# Patient Record
Sex: Male | Born: 1935 | Race: Black or African American | Hispanic: No | Marital: Married
Health system: Midwestern US, Community
[De-identification: ages and names within clinical notes are randomized; demographics above are authoritative.]

## PROBLEM LIST (undated history)

## (undated) DIAGNOSIS — R413 Other amnesia: Secondary | ICD-10-CM

## (undated) DIAGNOSIS — R972 Elevated prostate specific antigen [PSA]: Secondary | ICD-10-CM

## (undated) DIAGNOSIS — Z23 Encounter for immunization: Secondary | ICD-10-CM

## (undated) DIAGNOSIS — N401 Enlarged prostate with lower urinary tract symptoms: Secondary | ICD-10-CM

## (undated) DIAGNOSIS — I10 Essential (primary) hypertension: Secondary | ICD-10-CM

## (undated) DIAGNOSIS — E782 Mixed hyperlipidemia: Secondary | ICD-10-CM

## (undated) DIAGNOSIS — R569 Unspecified convulsions: Secondary | ICD-10-CM

## (undated) DIAGNOSIS — R2689 Other abnormalities of gait and mobility: Secondary | ICD-10-CM

## (undated) DIAGNOSIS — N189 Chronic kidney disease, unspecified: Secondary | ICD-10-CM

## (undated) DIAGNOSIS — E785 Hyperlipidemia, unspecified: Secondary | ICD-10-CM

## (undated) DIAGNOSIS — N400 Enlarged prostate without lower urinary tract symptoms: Secondary | ICD-10-CM

## (undated) DIAGNOSIS — D696 Thrombocytopenia, unspecified: Secondary | ICD-10-CM

## (undated) HISTORY — DX: Other amnesia: R41.3

## (undated) HISTORY — PX: REPLACEMENT TOTAL KNEE: SUR1224

## (undated) HISTORY — PX: CATARACT EXTRACTION: SUR2

## (undated) HISTORY — DX: Thrombocytopenia, unspecified: D69.6

## (undated) HISTORY — PX: INGUINAL HERNIA REPAIR: SUR1180

## (undated) HISTORY — DX: Benign prostatic hyperplasia without lower urinary tract symptoms: N40.0

## (undated) HISTORY — DX: Chronic kidney disease, unspecified: N18.9

## (undated) HISTORY — DX: Essential (primary) hypertension: I10

## (undated) HISTORY — DX: Hyperlipidemia, unspecified: E78.5

## (undated) HISTORY — PX: ROTATOR CUFF REPAIR: SHX139

---

## 2008-04-21 ENCOUNTER — Inpatient Hospital Stay (HOSPITAL_COMMUNITY): Admission: RE | Admit: 2008-04-21 | Discharge: 2008-04-23 | Payer: Self-pay | Admitting: Orthopedic Surgery

## 2009-07-05 IMAGING — CR DG CHEST 2V
2 series · 2 of 2 positions shown · non-contrast
Comparison: None

CLINICAL DATA: Hypertension.  Ex-smoker.  Preoperative evaluation.

CHEST - 2 VIEW

[view not recorded (1 of 2)]
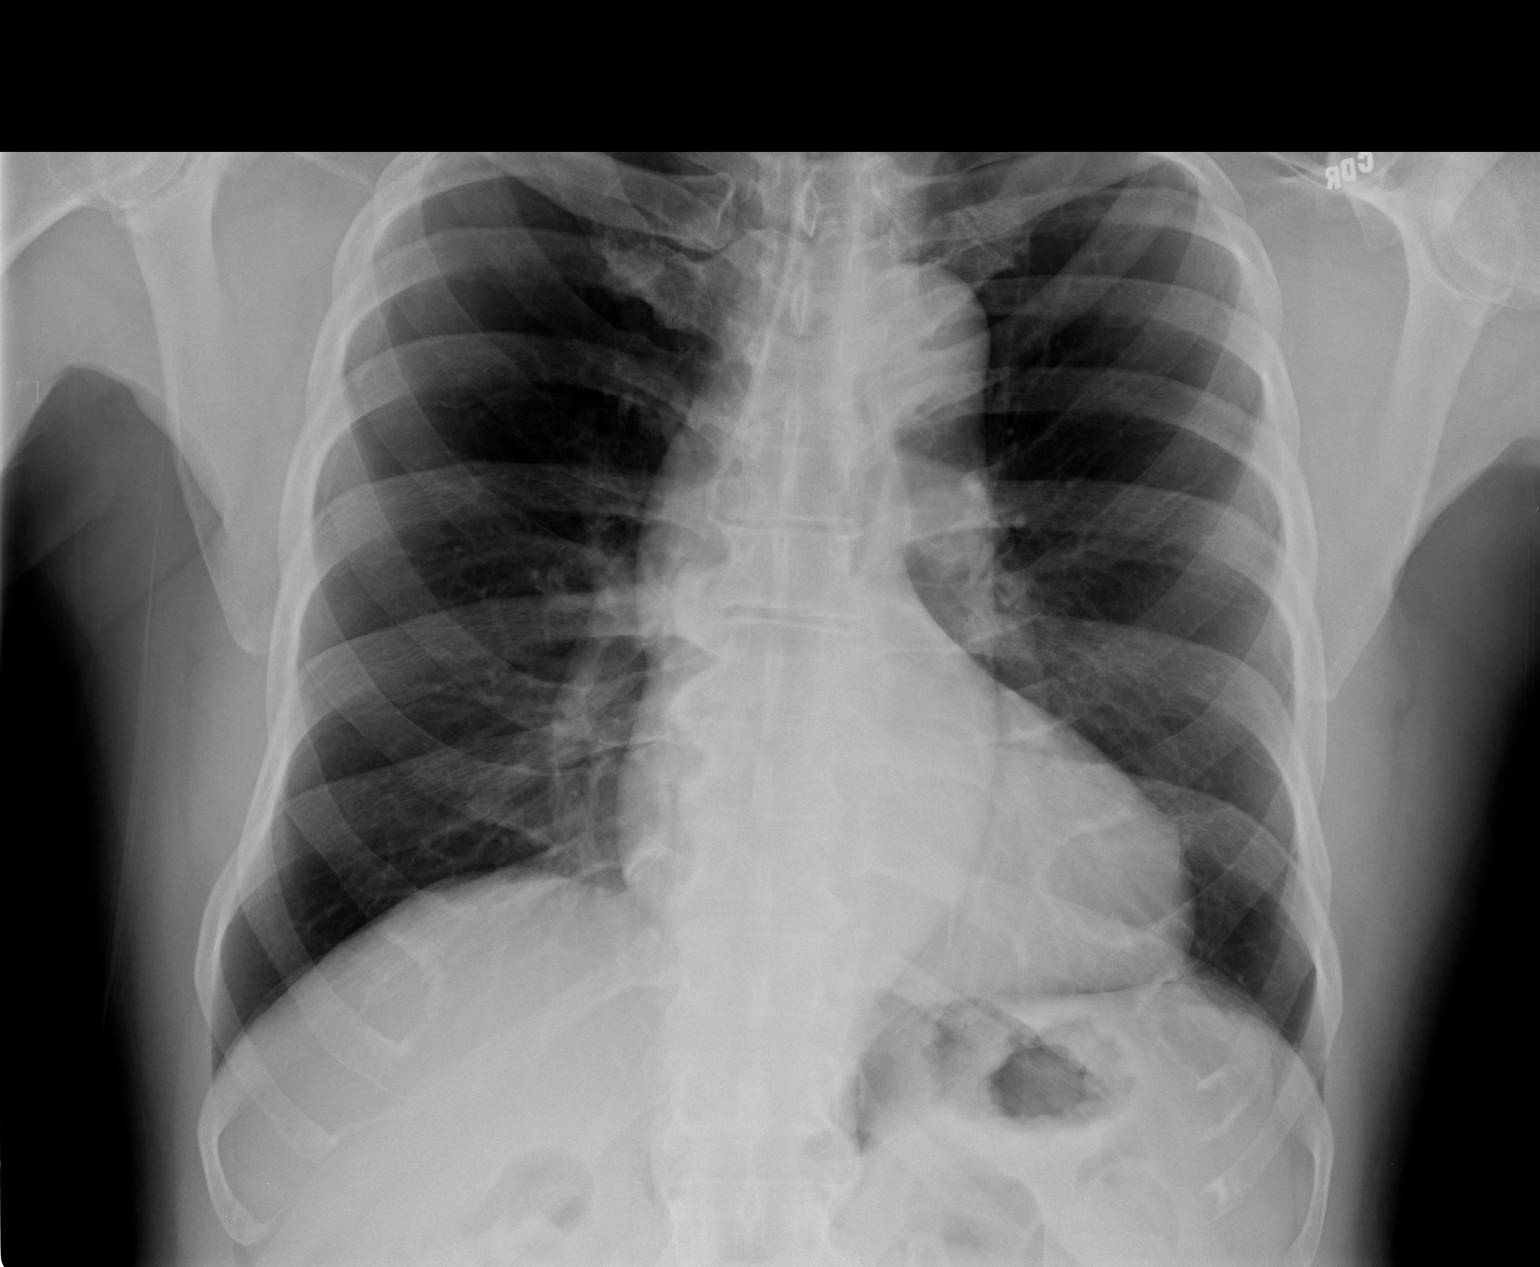

[view not recorded (2 of 2)]
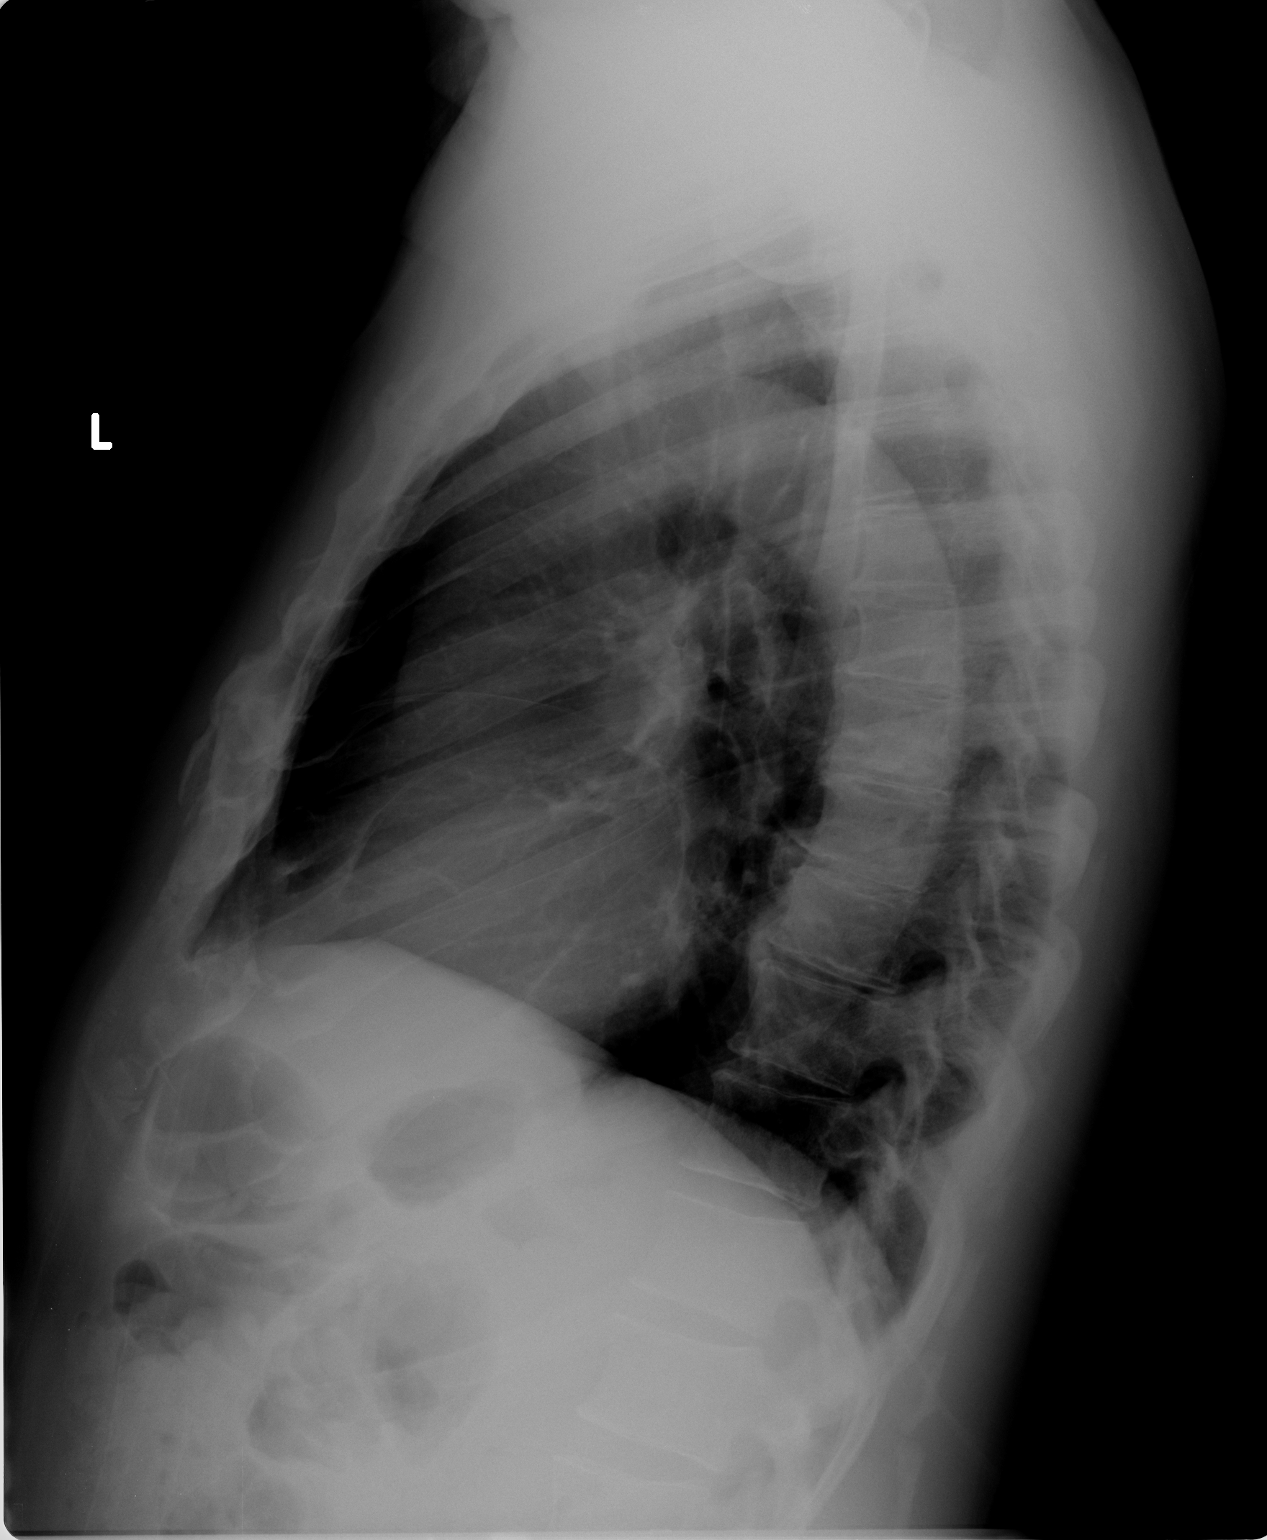

[2 of 2 positions shown; findings below may reference images not displayed]

FINDINGS: Cardiac silhouette is borderline in size.  Ectasia and
tortuosity of thoracic aorta is seen.  No pleural disease is
evident.  Lungs are free of infiltrates.  There are osteophytes in
the spine.
IMPRESSION: Borderline cardiac size with no acute or active process seen.

## 2009-12-14 ENCOUNTER — Encounter: Admission: RE | Admit: 2009-12-14 | Discharge: 2009-12-14 | Payer: Self-pay | Admitting: Orthopedic Surgery

## 2009-12-16 ENCOUNTER — Ambulatory Visit (HOSPITAL_BASED_OUTPATIENT_CLINIC_OR_DEPARTMENT_OTHER): Admission: RE | Admit: 2009-12-16 | Discharge: 2009-12-16 | Payer: Self-pay | Admitting: Orthopedic Surgery

## 2011-02-11 LAB — BASIC METABOLIC PANEL
Creatinine, Ser: 1.2 mg/dL (ref 0.4–1.5)
GFR calc Af Amer: >60 mL/min (ref >60)
GFR calc non Af Amer: 59 mL/min — ABNORMAL LOW (ref >60)
Glucose, Bld: 102 mg/dL — ABNORMAL HIGH (ref 70–99)
Potassium: 4.7 mEq/L (ref 3.5–5.1)
Sodium: 143 mEq/L (ref 135–145)

## 2011-03-05 IMAGING — CR DG CHEST 2V
2 series · 2 of 2 positions shown · non-contrast
Comparison: 04/15/2008

CLINICAL DATA: Preop impingement right shoulder.  Hypertension.

CHEST - 2 VIEW

[w chest pa]
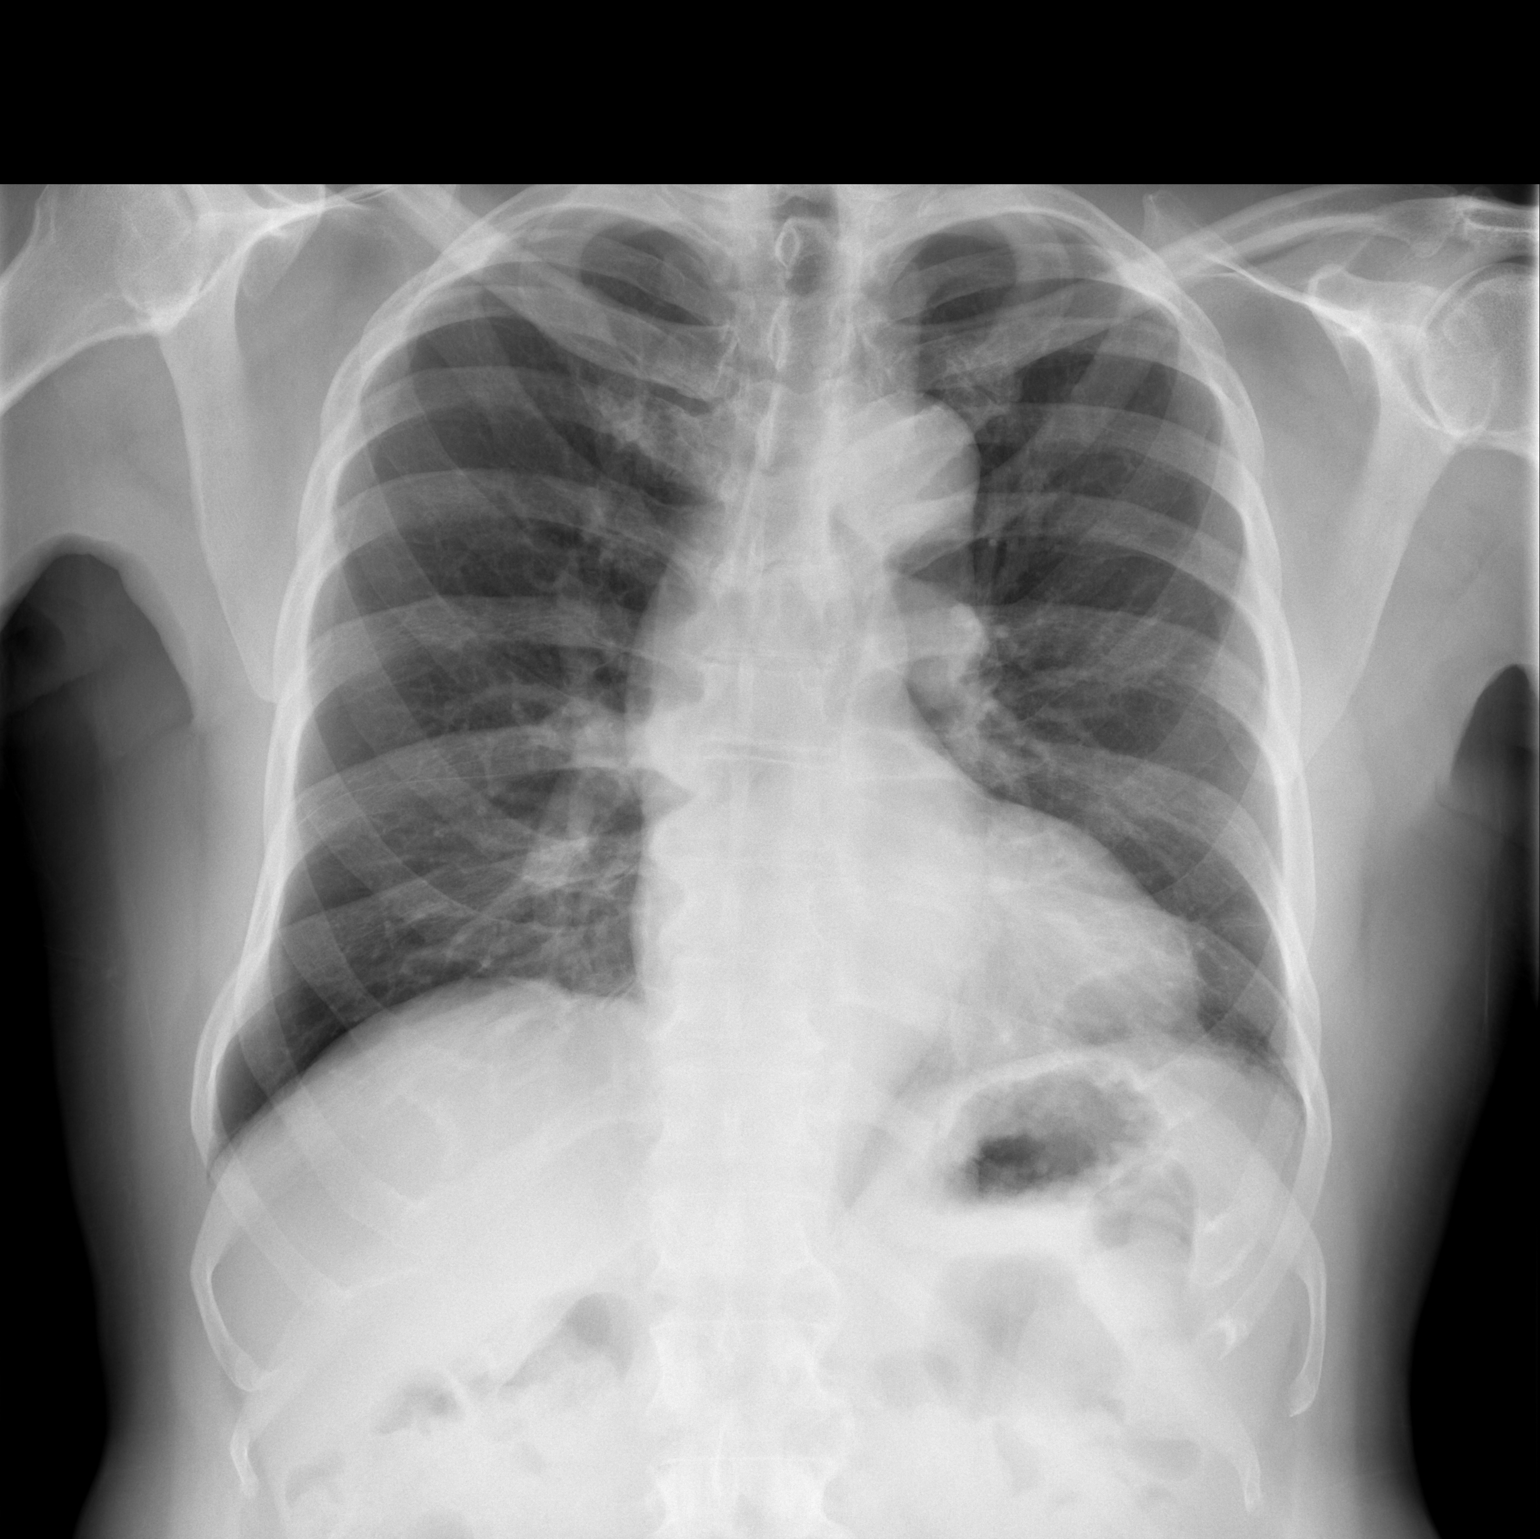

[w chest lat]
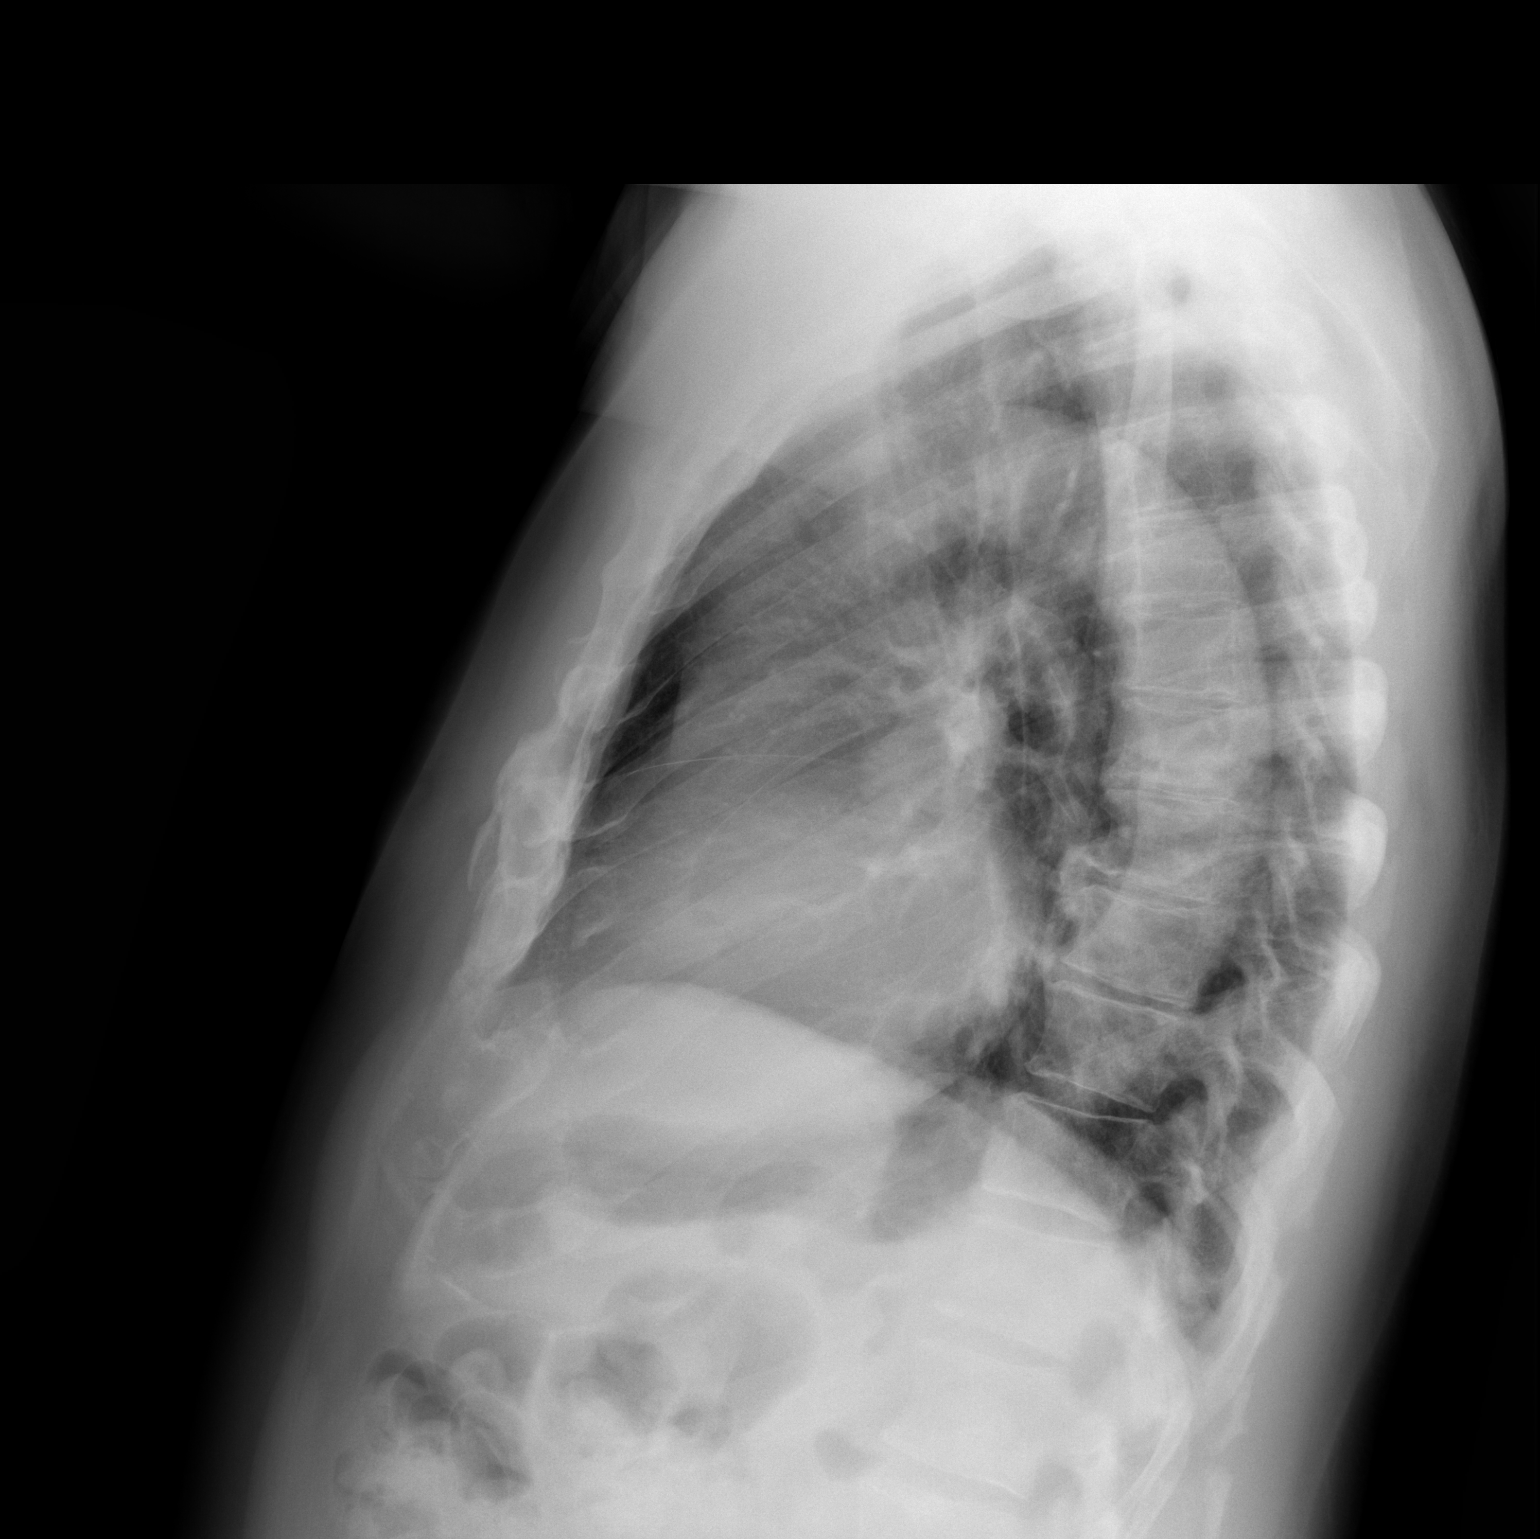

[2 of 2 positions shown; findings below may reference images not displayed]

FINDINGS: Cardiac size within normal limits.  Tortuous ectatic
thoracic aorta.  Lungs clear.  Bony thorax intact.
IMPRESSION: No active chest disease.

## 2011-04-10 NOTE — Op Note (Signed)
Kenneth [name], [name]             ACCOUNT NO.:  [id number]   MEDICAL RECORD NO.:  [id number]          PATIENT TYPE:  INP   LOCATION:  5005                         FACILITY:  [facility]   PHYSICIAN:  Harvie Junior, M.D.   DATE OF BIRTH:  08/29/1933   DATE OF PROCEDURE:  04/21/2008  DATE OF DISCHARGE:                               OPERATIVE REPORT   PREOPERATIVE DIAGNOSIS:  End-stage degenerative joint disease, left  knee.   POSTOPERATIVE DIAGNOSIS:  End-stage degenerative joint disease, left  knee.   OPERATIONS AND PROCEDURES:  1. Left total knee replacement with Sigma system size 5 femur, size 5      tibia, 10-mm bridging bearing, and a 38-mm All-Polyethylene      patella.  2. Operative procedure with computer-assisted total knee replacement,      left.   SURGEON:  Harvie Junior, MD   ASSISTANT:  Marshia Ly, PA   ANESTHESIA:  General.   BRIEF HISTORY:  Kenneth [name] is a 77 year old male with long history of  having had significant degenerative joint disease of his left knee.  He  had been treated conservatively for a period of time with failure of  activity modification, anti-inflammatory medication, and use of  assistive devices.  Having failed all of this, the patient was  ultimately taken to the operating room for total knee replacement.  The  patient had extremely youthful appearance and the longevity in the  family, we felt that computer assisted was appropriate, given the young  physiologic age of the patient for total knee replacement.   PROCEDURE:  The patient was taken to the operating room.  After adequate  anesthesia was obtained under general anesthetic, the patient placed in  supine on the operative room table.  The left leg was prepped and draped  in the usual sterile fashion.  Following this, the leg was exsanguinated  and blood pressure tourniquet was inflated to 350 mmHg.  Following this,  a midline incision was made.  Subcutaneous tissue was dissected  down to  the level of the extensor mechanism and a medial parapatellar arthrotomy  was undertaken and the medial and lateral meniscuses were then removed  as well as the anterior and posterior cruciates and a medial release was  performed, given the varus malalignment of the knee.  Once this was  completed, attention was turned towards the computer-assistance modules;  2 pins in the tibia and 2 pins in the femur and then registration  process that was about 20 minutes of surgical procedure.  Once this was  done, the tibia was cut perpendicular to its long axis under computer  assistance and the femur was then cut perpendicular to the anatomic axis  and then spacer blocks were used.  Excellent neutral long alignment was  achieved at this point with perfect gap balance and extension.  At this  point, attention was turned towards the sizing and the femur sized to a  5+ and we ultimately downsized to a 5 and used 3 degrees of external  rotation and matches with the computer and then did anterior and  posterior  cuts as well as the chamfers.  The rotational block was then  used at this point.  The flexion gap was checked with computer  assistance prior to doing the block cut.  At this point, the attention  was turned to the tibia which was then sized and the rotational  alignment was undertaken and a size 5 tibial trial was put in place.  The femoral components, when we went to put it on, the lateral side of  the component was edged into the pins per the computer assistance.  We  knew that we had perfect neutral long alignment of the extension and the  flexion gap was balanced perfectly.  At this point, we pulled the  computer-assistance alignment modules.  Once these were pulled, the  trial femoral component was put in the place, long alignment was  checked.  The patella was then cut with patellar cutting guide to 14 mm  and 38 mm chosen and lugs were drilled here.  Once this was completed,   attention was turned towards removal of all trial components after we  assured that we have perfect neutral long alignment and easy full range  of motion.  All trial components were removed.  The knee was then  copiously and thoroughly irrigated and suctioned dry.  Cement was then  applied in the interfaces of the bone after it was mixed in the back  table.  A size-5 femur was cemented in the place, size 5 tibia was  cemented in the place, and a trial 10-mm bearing was put in place with  the knee held in full extension and then a 38-mm All-Poly patella was  then put in place.  Once this was completed, the cement was allowed to  harden.  The trial bearing was removed and a tourniquet was let down.  All bleeding was controlled with electrocautery.  At this point, a  medium Hemovac drain was placed and the medial parapatellar arthrotomy  was closed with 1 Vicryl running, the skin with 0 and 2-0 Vicryl, and  skin with staples.  Sterile compression dressing was applied.  The  patient was taken to the recovery room and was noted to be in  satisfactory condition.  Estimated blood loss during the procedure was  none.      Harvie Junior, M.D.  Electronically Signed     JLG/MEDQ  D:  04/21/2008  T:  04/22/2008  Job:  [id number]

## 2011-08-22 LAB — CBC
HCT: 34 — ABNORMAL LOW
Hemoglobin: 11.4 — ABNORMAL LOW
Hemoglobin: 13.6
Platelets: 122 — ABNORMAL LOW
Platelets: 132 — ABNORMAL LOW
RBC: 4.54
RDW: 14.9
RDW: 14.9
WBC: 10.8 — ABNORMAL HIGH
WBC: 6.6

## 2011-08-22 LAB — BASIC METABOLIC PANEL
BUN: 14
CO2: 24
Chloride: 107
Glucose, Bld: 142 — ABNORMAL HIGH
Potassium: 4.1
Sodium: 136

## 2011-08-22 LAB — URINALYSIS, ROUTINE W REFLEX MICROSCOPIC
Bilirubin Urine: NEGATIVE
Glucose, UA: NEGATIVE
Protein, ur: NEGATIVE
Urobilinogen, UA: 0.2

## 2011-08-22 LAB — DIFFERENTIAL
Basophils Absolute: 0
Basophils Relative: 1
Eosinophils Absolute: 0.1
Lymphocytes Relative: 33
Monocytes Relative: 7
Neutrophils Relative %: 58

## 2011-08-22 LAB — APTT: aPTT: 29

## 2011-08-22 LAB — COMPREHENSIVE METABOLIC PANEL
Albumin: 4.1
Chloride: 106
Potassium: 3.7
Total Bilirubin: 0.8
Total Protein: 7.9

## 2011-08-22 LAB — PROTIME-INR
INR: 0.9
INR: 1.2
Prothrombin Time: 12.4
Prothrombin Time: 13.9

## 2011-08-22 LAB — TYPE AND SCREEN
ABO/RH(D): O POS
Antibody Screen: NEGATIVE

## 2011-08-22 LAB — ABO/RH: ABO/RH(D): O POS

## 2011-12-27 DIAGNOSIS — M25519 Pain in unspecified shoulder: Secondary | ICD-10-CM | POA: Diagnosis not present

## 2012-03-04 DIAGNOSIS — I10 Essential (primary) hypertension: Secondary | ICD-10-CM | POA: Diagnosis not present

## 2012-03-04 DIAGNOSIS — R413 Other amnesia: Secondary | ICD-10-CM | POA: Diagnosis not present

## 2012-03-04 DIAGNOSIS — N400 Enlarged prostate without lower urinary tract symptoms: Secondary | ICD-10-CM | POA: Diagnosis not present

## 2012-03-04 DIAGNOSIS — M719 Bursopathy, unspecified: Secondary | ICD-10-CM | POA: Diagnosis not present

## 2012-03-04 DIAGNOSIS — E782 Mixed hyperlipidemia: Secondary | ICD-10-CM | POA: Diagnosis not present

## 2012-04-09 DIAGNOSIS — R972 Elevated prostate specific antigen [PSA]: Secondary | ICD-10-CM | POA: Diagnosis not present

## 2012-04-09 DIAGNOSIS — N401 Enlarged prostate with lower urinary tract symptoms: Secondary | ICD-10-CM | POA: Diagnosis not present

## 2012-09-15 DIAGNOSIS — I10 Essential (primary) hypertension: Secondary | ICD-10-CM | POA: Diagnosis not present

## 2012-09-15 DIAGNOSIS — N400 Enlarged prostate without lower urinary tract symptoms: Secondary | ICD-10-CM | POA: Diagnosis not present

## 2012-09-15 DIAGNOSIS — E782 Mixed hyperlipidemia: Secondary | ICD-10-CM | POA: Diagnosis not present

## 2012-09-15 DIAGNOSIS — Z23 Encounter for immunization: Secondary | ICD-10-CM | POA: Diagnosis not present

## 2012-09-15 DIAGNOSIS — R413 Other amnesia: Secondary | ICD-10-CM | POA: Diagnosis not present

## 2012-09-15 DIAGNOSIS — H269 Unspecified cataract: Secondary | ICD-10-CM | POA: Diagnosis not present

## 2012-10-08 DIAGNOSIS — N401 Enlarged prostate with lower urinary tract symptoms: Secondary | ICD-10-CM | POA: Diagnosis not present

## 2012-10-08 DIAGNOSIS — R972 Elevated prostate specific antigen [PSA]: Secondary | ICD-10-CM | POA: Diagnosis not present

## 2013-03-17 DIAGNOSIS — R413 Other amnesia: Secondary | ICD-10-CM | POA: Diagnosis not present

## 2013-03-17 DIAGNOSIS — I10 Essential (primary) hypertension: Secondary | ICD-10-CM | POA: Diagnosis not present

## 2013-03-17 DIAGNOSIS — N400 Enlarged prostate without lower urinary tract symptoms: Secondary | ICD-10-CM | POA: Diagnosis not present

## 2013-03-17 DIAGNOSIS — E782 Mixed hyperlipidemia: Secondary | ICD-10-CM | POA: Diagnosis not present

## 2013-04-08 DIAGNOSIS — N401 Enlarged prostate with lower urinary tract symptoms: Secondary | ICD-10-CM | POA: Diagnosis not present

## 2013-04-15 DIAGNOSIS — R972 Elevated prostate specific antigen [PSA]: Secondary | ICD-10-CM | POA: Diagnosis not present

## 2013-04-15 DIAGNOSIS — N401 Enlarged prostate with lower urinary tract symptoms: Secondary | ICD-10-CM | POA: Diagnosis not present

## 2013-04-28 DIAGNOSIS — M79609 Pain in unspecified limb: Secondary | ICD-10-CM | POA: Diagnosis not present

## 2013-04-28 DIAGNOSIS — B351 Tinea unguium: Secondary | ICD-10-CM | POA: Diagnosis not present

## 2013-05-26 DIAGNOSIS — B351 Tinea unguium: Secondary | ICD-10-CM | POA: Diagnosis not present

## 2013-05-26 DIAGNOSIS — M79609 Pain in unspecified limb: Secondary | ICD-10-CM | POA: Diagnosis not present

## 2013-06-23 DIAGNOSIS — B351 Tinea unguium: Secondary | ICD-10-CM | POA: Diagnosis not present

## 2013-06-23 DIAGNOSIS — M79609 Pain in unspecified limb: Secondary | ICD-10-CM | POA: Diagnosis not present

## 2013-06-23 DIAGNOSIS — M659 Synovitis and tenosynovitis, unspecified: Secondary | ICD-10-CM | POA: Diagnosis not present

## 2013-06-23 DIAGNOSIS — M25579 Pain in unspecified ankle and joints of unspecified foot: Secondary | ICD-10-CM | POA: Diagnosis not present

## 2013-07-28 DIAGNOSIS — M79609 Pain in unspecified limb: Secondary | ICD-10-CM | POA: Diagnosis not present

## 2013-07-28 DIAGNOSIS — B351 Tinea unguium: Secondary | ICD-10-CM | POA: Diagnosis not present

## 2013-09-18 DIAGNOSIS — R413 Other amnesia: Secondary | ICD-10-CM | POA: Diagnosis not present

## 2013-09-18 DIAGNOSIS — I10 Essential (primary) hypertension: Secondary | ICD-10-CM | POA: Diagnosis not present

## 2013-09-18 DIAGNOSIS — Z0000 Encounter for general adult medical examination without abnormal findings: Secondary | ICD-10-CM | POA: Diagnosis not present

## 2013-09-18 DIAGNOSIS — E782 Mixed hyperlipidemia: Secondary | ICD-10-CM | POA: Diagnosis not present

## 2013-09-18 DIAGNOSIS — K5900 Constipation, unspecified: Secondary | ICD-10-CM | POA: Diagnosis not present

## 2013-09-18 DIAGNOSIS — H9190 Unspecified hearing loss, unspecified ear: Secondary | ICD-10-CM | POA: Diagnosis not present

## 2013-09-18 DIAGNOSIS — Z23 Encounter for immunization: Secondary | ICD-10-CM | POA: Diagnosis not present

## 2013-09-18 DIAGNOSIS — N400 Enlarged prostate without lower urinary tract symptoms: Secondary | ICD-10-CM | POA: Diagnosis not present

## 2013-11-03 DIAGNOSIS — M79609 Pain in unspecified limb: Secondary | ICD-10-CM | POA: Diagnosis not present

## 2013-11-03 DIAGNOSIS — B351 Tinea unguium: Secondary | ICD-10-CM | POA: Diagnosis not present

## 2013-12-17 DIAGNOSIS — R195 Other fecal abnormalities: Secondary | ICD-10-CM | POA: Diagnosis not present

## 2013-12-17 DIAGNOSIS — D126 Benign neoplasm of colon, unspecified: Secondary | ICD-10-CM | POA: Diagnosis not present

## 2013-12-17 DIAGNOSIS — K620 Anal polyp: Secondary | ICD-10-CM | POA: Diagnosis not present

## 2013-12-17 DIAGNOSIS — K621 Rectal polyp: Secondary | ICD-10-CM | POA: Diagnosis not present

## 2013-12-17 DIAGNOSIS — K648 Other hemorrhoids: Secondary | ICD-10-CM | POA: Diagnosis not present

## 2014-03-16 DIAGNOSIS — Z23 Encounter for immunization: Secondary | ICD-10-CM | POA: Diagnosis not present

## 2014-03-20 DIAGNOSIS — M542 Cervicalgia: Secondary | ICD-10-CM | POA: Diagnosis not present

## 2014-03-20 DIAGNOSIS — M5030 Other cervical disc degeneration, unspecified cervical region: Secondary | ICD-10-CM | POA: Diagnosis not present

## 2014-04-08 DIAGNOSIS — M542 Cervicalgia: Secondary | ICD-10-CM | POA: Diagnosis not present

## 2014-04-08 DIAGNOSIS — M25519 Pain in unspecified shoulder: Secondary | ICD-10-CM | POA: Diagnosis not present

## 2014-04-13 DIAGNOSIS — I10 Essential (primary) hypertension: Secondary | ICD-10-CM | POA: Diagnosis not present

## 2014-04-13 DIAGNOSIS — N183 Chronic kidney disease, stage 3 unspecified: Secondary | ICD-10-CM | POA: Diagnosis not present

## 2014-04-13 DIAGNOSIS — N400 Enlarged prostate without lower urinary tract symptoms: Secondary | ICD-10-CM | POA: Diagnosis not present

## 2014-04-13 DIAGNOSIS — R413 Other amnesia: Secondary | ICD-10-CM | POA: Diagnosis not present

## 2014-04-13 DIAGNOSIS — E782 Mixed hyperlipidemia: Secondary | ICD-10-CM | POA: Diagnosis not present

## 2014-04-14 DIAGNOSIS — R972 Elevated prostate specific antigen [PSA]: Secondary | ICD-10-CM | POA: Diagnosis not present

## 2014-04-21 DIAGNOSIS — R972 Elevated prostate specific antigen [PSA]: Secondary | ICD-10-CM | POA: Diagnosis not present

## 2014-04-21 DIAGNOSIS — N529 Male erectile dysfunction, unspecified: Secondary | ICD-10-CM | POA: Diagnosis not present

## 2014-04-21 DIAGNOSIS — N401 Enlarged prostate with lower urinary tract symptoms: Secondary | ICD-10-CM | POA: Diagnosis not present

## 2014-07-17 DIAGNOSIS — Q762 Congenital spondylolisthesis: Secondary | ICD-10-CM | POA: Diagnosis not present

## 2014-07-21 DIAGNOSIS — IMO0002 Reserved for concepts with insufficient information to code with codable children: Secondary | ICD-10-CM | POA: Diagnosis not present

## 2014-07-26 DIAGNOSIS — IMO0002 Reserved for concepts with insufficient information to code with codable children: Secondary | ICD-10-CM | POA: Diagnosis not present

## 2014-07-28 DIAGNOSIS — IMO0002 Reserved for concepts with insufficient information to code with codable children: Secondary | ICD-10-CM | POA: Diagnosis not present

## 2014-08-03 DIAGNOSIS — IMO0002 Reserved for concepts with insufficient information to code with codable children: Secondary | ICD-10-CM | POA: Diagnosis not present

## 2014-08-05 DIAGNOSIS — IMO0002 Reserved for concepts with insufficient information to code with codable children: Secondary | ICD-10-CM | POA: Diagnosis not present

## 2014-08-09 DIAGNOSIS — IMO0002 Reserved for concepts with insufficient information to code with codable children: Secondary | ICD-10-CM | POA: Diagnosis not present

## 2014-08-10 DIAGNOSIS — IMO0002 Reserved for concepts with insufficient information to code with codable children: Secondary | ICD-10-CM | POA: Diagnosis not present

## 2014-09-14 DIAGNOSIS — Z23 Encounter for immunization: Secondary | ICD-10-CM | POA: Diagnosis not present

## 2014-12-14 DIAGNOSIS — Z1389 Encounter for screening for other disorder: Secondary | ICD-10-CM | POA: Diagnosis not present

## 2014-12-14 DIAGNOSIS — Z0000 Encounter for general adult medical examination without abnormal findings: Secondary | ICD-10-CM | POA: Diagnosis not present

## 2014-12-14 DIAGNOSIS — E782 Mixed hyperlipidemia: Secondary | ICD-10-CM | POA: Diagnosis not present

## 2014-12-14 DIAGNOSIS — R413 Other amnesia: Secondary | ICD-10-CM | POA: Diagnosis not present

## 2014-12-14 DIAGNOSIS — N183 Chronic kidney disease, stage 3 (moderate): Secondary | ICD-10-CM | POA: Diagnosis not present

## 2014-12-14 DIAGNOSIS — N400 Enlarged prostate without lower urinary tract symptoms: Secondary | ICD-10-CM | POA: Diagnosis not present

## 2014-12-14 DIAGNOSIS — I10 Essential (primary) hypertension: Secondary | ICD-10-CM | POA: Diagnosis not present

## 2014-12-14 DIAGNOSIS — I499 Cardiac arrhythmia, unspecified: Secondary | ICD-10-CM | POA: Diagnosis not present

## 2015-04-20 DIAGNOSIS — R972 Elevated prostate specific antigen [PSA]: Secondary | ICD-10-CM | POA: Diagnosis not present

## 2015-04-27 DIAGNOSIS — R972 Elevated prostate specific antigen [PSA]: Secondary | ICD-10-CM | POA: Diagnosis not present

## 2015-04-27 DIAGNOSIS — N5201 Erectile dysfunction due to arterial insufficiency: Secondary | ICD-10-CM | POA: Diagnosis not present

## 2015-06-21 DIAGNOSIS — N183 Chronic kidney disease, stage 3 (moderate): Secondary | ICD-10-CM | POA: Diagnosis not present

## 2015-06-21 DIAGNOSIS — R413 Other amnesia: Secondary | ICD-10-CM | POA: Diagnosis not present

## 2015-06-21 DIAGNOSIS — I10 Essential (primary) hypertension: Secondary | ICD-10-CM | POA: Diagnosis not present

## 2015-06-21 DIAGNOSIS — D696 Thrombocytopenia, unspecified: Secondary | ICD-10-CM | POA: Diagnosis not present

## 2015-06-21 DIAGNOSIS — E782 Mixed hyperlipidemia: Secondary | ICD-10-CM | POA: Diagnosis not present

## 2015-06-21 DIAGNOSIS — N400 Enlarged prostate without lower urinary tract symptoms: Secondary | ICD-10-CM | POA: Diagnosis not present

## 2015-08-02 ENCOUNTER — Encounter: Payer: Self-pay | Admitting: Neurology

## 2015-08-02 ENCOUNTER — Ambulatory Visit (INDEPENDENT_AMBULATORY_CARE_PROVIDER_SITE_OTHER): Payer: Medicare Other | Admitting: Neurology

## 2015-08-02 VITALS — BP 149/77 | HR 48 | Ht 74.0 in | Wt 188.0 lb

## 2015-08-02 DIAGNOSIS — R413 Other amnesia: Secondary | ICD-10-CM | POA: Diagnosis not present

## 2015-08-02 DIAGNOSIS — E538 Deficiency of other specified B group vitamins: Secondary | ICD-10-CM | POA: Diagnosis not present

## 2015-08-02 HISTORY — DX: Other amnesia: R41.3

## 2015-08-02 NOTE — Patient Instructions (Signed)
   We will check blood work today and get MRI evaluation of the brain. We may consider medications for memory in the future if you wish to go on something.

## 2015-08-02 NOTE — Progress Notes (Signed)
Reason for visit:  Memory disorder  Referring physician:  [name] is a 81 y.o. male  History of present illness:   Kenneth [name] is an 81 year old right-handed black male with a one-year history of some decline in memory and cognitive function. He indicates that he is having increasing problems with short-term memory, difficulty remembering names for people, and he will repeat himself frequently. He uses GPS for driving, but his wife indicates that he does have some troubles with directions. He will occasionally forget to pay a bill. He denies any issues keeping up with medications or appointments. He will misplace things about the house on occasion. He has not had to give up anything secondary to memory. He does report some generalized fatigue, but no excessive daytime drowsiness. The wife indicates that he has snored at night for years. He denies any difficulty with balance or difficulty controlling the bowels or the bladder. He denies any numbness or weakness of the extremities. He indicates that he sleeps fairly well at night. The has had routine blood work done through his primary care physician. He is sent to this office for further evaluation.  Past Medical History  Diagnosis Date  . Hyperlipidemia   . Hypertension   . Chronic kidney disease   . BPH (benign prostatic hyperplasia)   . Memory loss   . Thrombocytopenia   . Memory change 08/02/2015    Past Surgical History  Procedure Laterality Date  . Replacement total knee Left   . Rotator cuff repair Bilateral   . Cataract extraction Bilateral   . Inguinal hernia repair Bilateral     Family History  Problem Relation Age of Onset  . Ovarian cancer Mother   . Stomach cancer Father   . Parkinsonism Sister   . Dementia Neg Hx     Social history:  reports that he quit smoking about 38 years ago. He has never used smokeless tobacco. He reports that he does not drink alcohol or use illicit  drugs.  Medications:  Prior to Admission medications   Medication Sig Start Date End Date Taking? Authorizing Provider  amLODipine-benazepril (LOTREL) 10-40 MG per capsule Take 1 capsule by mouth daily.   Yes Historical Provider, MD  aspirin 81 MG tablet Take 81 mg by mouth daily.   Yes Historical Provider, MD  atorvastatin (LIPITOR) 40 MG tablet Take 40 mg by mouth daily.   Yes Historical Provider, MD  finasteride (PROSCAR) 5 MG tablet Take 5 mg by mouth daily.   Yes Historical Provider, MD  Multiple Vitamins-Minerals (CENTRUM SILVER PO) Take 1 tablet by mouth daily.   Yes Historical Provider, MD  naproxen sodium (ANAPROX) 220 MG tablet Take 220 mg by mouth 2 (two) times daily as needed.   Yes Historical Provider, MD  Omega-3 Fatty Acids (OMEGA 3 PO) Take 1 tablet by mouth daily.   Yes Historical Provider, MD     No Known Allergies  ROS:  Out of a complete 14 system review of symptoms, the patient complains only of the following symptoms, and all other reviewed systems are negative.   Memory loss  Impotence  Blood pressure 149/77, pulse 48, height 6\' 2"  (1.88 m), weight 188 lb (85.276 kg).  Physical Exam  General: The patient is alert and cooperative at the time of the examination.  Eyes: Pupils are equal, round, and reactive to light. Discs are flat bilaterally.  Neck: The neck is supple, no carotid bruits are noted.  Respiratory:  The respiratory examination is clear.  Cardiovascular: The cardiovascular examination reveals a regular rate and rhythm, no obvious murmurs or rubs are noted.  Skin: Extremities are without significant edema.  Neurologic Exam  Mental status: The patient is alert and oriented x 3 at the time of the examination. The  Mini-Mental status examination done today shows a total score of 25/30. The patient is able to name 8 animals in 30 seconds.  Cranial nerves: Facial symmetry is present. There is good sensation of the face to pinprick and soft touch  bilaterally. The strength of the facial muscles and the muscles to head turning and shoulder shrug are normal bilaterally. Speech is well enunciated, no aphasia or dysarthria is noted. Extraocular movements are full. Visual fields are full. The tongue is midline, and the patient has symmetric elevation of the soft palate. No obvious hearing deficits are noted.  Motor: The motor testing reveals 5 over 5 strength of all 4 extremities. Good symmetric motor tone is noted throughout.  Sensory: Sensory testing is intact to pinprick, soft touch, vibration sensation, and position sense on all 4 extremities. No evidence of extinction is noted.  Coordination: Cerebellar testing reveals good finger-nose-finger and heel-to-shin bilaterally.  Gait and station: Gait is normal. Tandem gait is normal. Romberg is negative. No drift is seen.  Reflexes: Deep tendon reflexes are symmetric , but are depressed bilaterally. Toes are downgoing bilaterally.   Assessment/Plan:   1. Memory disturbance    The patient has reported a mild memory disturbance that has come on over the last one year. He will be set up for blood work today, and undergo MRI evaluation of the brain. We have discussed the addition of medication such as Aricept, and the possibility of entering a study trial for dementia. I will discuss this with him once the above testing has been completed. He will otherwise follow-up in 6 months.  Kenneth Alexanders MD 08/02/2015 8:28 PM  [facility] [address] [place], [place] [address]  Phone [phone] Fax [phone]

## 2015-08-04 ENCOUNTER — Telehealth: Payer: Self-pay

## 2015-08-04 LAB — VITAMIN B12: VITAMIN B 12: 916 pg/mL (ref 211–946)

## 2015-08-04 LAB — METHYLMALONIC ACID, SERUM: METHYLMALONIC ACID: 110 nmol/L (ref 0–378)

## 2015-08-04 LAB — COPPER, SERUM: COPPER: 103 ug/dL (ref 72–166)

## 2015-08-04 LAB — RPR: RPR: NONREACTIVE

## 2015-08-04 NOTE — Telephone Encounter (Signed)
-----   Message from Kathrynn Ducking, MD sent at 08/04/2015  2:08 PM EDT -----  The blood work results are unremarkable. Please call the patient.  ----- Message -----    From: [facility] Lab Results In Interface    Sent: 08/03/2015   7:43 AM      To: Kathrynn Ducking, MD

## 2015-08-04 NOTE — Telephone Encounter (Signed)
Left voicemail relaying results. 

## 2015-08-10 ENCOUNTER — Ambulatory Visit
Admission: RE | Admit: 2015-08-10 | Discharge: 2015-08-10 | Disposition: A | Discharge status: Home / Self Care | Payer: Medicare Other | Source: Ambulatory Visit | Attending: Neurology | Admitting: Neurology

## 2015-08-10 DIAGNOSIS — R413 Other amnesia: Secondary | ICD-10-CM

## 2015-08-11 ENCOUNTER — Telehealth: Payer: Self-pay | Admitting: Neurology

## 2015-08-11 MED ORDER — DONEPEZIL HCL 5 MG PO TABS: 5.0000 mg | ORAL_TABLET | Freq: Every day | ORAL | Status: DC

## 2015-08-11 NOTE — Telephone Encounter (Signed)
  I called patient. The MRI the brain does show a moderate level small vessel disease. The patient does have hypertension and renal disease. He is on aspirin. The patient has some atrophy as well. He is interested in going on medications for memory, I will call in a prescription for Aricept. He will call in one month if he is tolerating the medication, we'll go up on the dose to a 10 mg tablet.  MRI brain 08/11/15:  IMPRESSION:  Abnormal MRI brain (with and without) demonstrating: 1. Mild-moderate chronic small vessel ischemic disease.  2. 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  3. No acute findings.

## 2015-09-07 DIAGNOSIS — Z23 Encounter for immunization: Secondary | ICD-10-CM | POA: Diagnosis not present

## 2015-10-06 DIAGNOSIS — M25571 Pain in right ankle and joints of right foot: Secondary | ICD-10-CM | POA: Diagnosis not present

## 2015-10-06 DIAGNOSIS — M25871 Other specified joint disorders, right ankle and foot: Secondary | ICD-10-CM | POA: Diagnosis not present

## 2015-10-06 DIAGNOSIS — M7751 Other enthesopathy of right foot: Secondary | ICD-10-CM | POA: Diagnosis not present

## 2015-10-10 ENCOUNTER — Telehealth: Payer: Self-pay | Admitting: Neurology

## 2015-10-10 MED ORDER — DONEPEZIL HCL 10 MG PO TABS: 10.0000 mg | ORAL_TABLET | Freq: Every day | ORAL | Status: DC

## 2015-10-10 NOTE — Telephone Encounter (Signed)
Pt called sts donepezil (ARICEPT) 5 MG tablet he is out but would like to increase dose. Please send to [facility]

## 2015-10-10 NOTE — Telephone Encounter (Signed)
I will call in a prescription for the 10 mg Aricept.

## 2015-10-13 DIAGNOSIS — M71571 Other bursitis, not elsewhere classified, right ankle and foot: Secondary | ICD-10-CM | POA: Diagnosis not present

## 2015-10-13 DIAGNOSIS — M10071 Idiopathic gout, right ankle and foot: Secondary | ICD-10-CM | POA: Diagnosis not present

## 2015-12-26 DIAGNOSIS — N400 Enlarged prostate without lower urinary tract symptoms: Secondary | ICD-10-CM | POA: Diagnosis not present

## 2015-12-26 DIAGNOSIS — R413 Other amnesia: Secondary | ICD-10-CM | POA: Diagnosis not present

## 2015-12-26 DIAGNOSIS — D696 Thrombocytopenia, unspecified: Secondary | ICD-10-CM | POA: Diagnosis not present

## 2015-12-26 DIAGNOSIS — N183 Chronic kidney disease, stage 3 (moderate): Secondary | ICD-10-CM | POA: Diagnosis not present

## 2015-12-26 DIAGNOSIS — I10 Essential (primary) hypertension: Secondary | ICD-10-CM | POA: Diagnosis not present

## 2015-12-26 DIAGNOSIS — E782 Mixed hyperlipidemia: Secondary | ICD-10-CM | POA: Diagnosis not present

## 2016-01-30 ENCOUNTER — Encounter: Payer: Self-pay | Admitting: Adult Health

## 2016-01-30 ENCOUNTER — Ambulatory Visit: Payer: Medicare Other | Admitting: Adult Health

## 2016-01-30 ENCOUNTER — Ambulatory Visit (INDEPENDENT_AMBULATORY_CARE_PROVIDER_SITE_OTHER): Payer: Medicare Other | Admitting: Adult Health

## 2016-01-30 VITALS — BP 150/84 | HR 53 | Ht 74.0 in | Wt 188.0 lb

## 2016-01-30 DIAGNOSIS — R413 Other amnesia: Secondary | ICD-10-CM | POA: Diagnosis not present

## 2016-01-30 MED ORDER — DONEPEZIL HCL 10 MG PO TABS: 10.0000 mg | ORAL_TABLET | Freq: Every day | ORAL | Status: AC

## 2016-01-30 NOTE — Patient Instructions (Addendum)
Continue Aricept Memory score is stable If your symptoms worsen or you develop new symptoms please let us know.   

## 2016-01-30 NOTE — Progress Notes (Signed)
/   PATIENT: Kenneth [name] DOB: 1933/11/07  REASON FOR VISIT: follow up- memory HISTORY FROM: patient  HISTORY OF PRESENT ILLNESS: [name] is an 82 year old male with a history of memory disturbance. He returns today for follow-up. The patient is currently on Aricept 10 mg daily. He is tolerating the medication well. The patient feels that his memory has remained stable. He is able to complete all ADLs independently. He operates a Teacher, music without difficulty. He handles his own finances. His wife does the majority of the cooking. His wife reports that occasionally she'll ask him to complete a task and its as if he doesn't understand what she is saying. The patient contributes this to his hearing. He states he plans to have a hearing test when he returns to the [name]. The patient also requests that I provide him with a 90 day prescription for Aricept for him to have filled at the pharmacy at [facility]. He denies any new neurological symptoms. He returns today for an evaluation. [name] HISTORY 08/02/15: [name] is an 82 year old right-handed black male with a one-year history of some decline in memory and cognitive function. He indicates that he is having increasing problems with short-term memory, difficulty remembering names for people, and he will repeat himself frequently. He uses GPS for driving, but his wife indicates that he does have some troubles with directions. He will occasionally forget to pay a bill. He denies any issues keeping up with medications or appointments. He will misplace things about the house on occasion. He has not had to give up anything secondary to memory. He does report some generalized fatigue, but no excessive daytime drowsiness. The wife indicates that he has snored at night for years. He denies any difficulty with balance or difficulty controlling the bowels or the bladder. He denies any numbness or weakness of the extremities. He  indicates that he sleeps fairly well at night. The has had routine blood work done through his primary care physician. He is sent to this office for further evaluation  REVIEW OF SYSTEMS: Out of a complete 14 system review of symptoms, the patient complains only of the following symptoms, and all other reviewed systems are negative.  See history of present illness  ALLERGIES: No Known Allergies  HOME MEDICATIONS: Outpatient Prescriptions Prior to Visit  Medication Sig Dispense Refill  . amLODipine-benazepril (LOTREL) 10-40 MG per capsule Take 1 capsule by mouth daily.    [name] aspirin 81 MG tablet Take 81 mg by mouth daily.    [name] atorvastatin (LIPITOR) 40 MG tablet Take 40 mg by mouth daily.    [name] donepezil (ARICEPT) 10 MG tablet Take 1 tablet (10 mg total) by mouth at bedtime. 30 tablet 5  . finasteride (PROSCAR) 5 MG tablet Take 5 mg by mouth daily.    . Multiple Vitamins-Minerals (CENTRUM SILVER PO) Take 1 tablet by mouth daily.    . Omega-3 Fatty Acids (OMEGA 3 PO) Take 1 tablet by mouth daily.    . naproxen sodium (ANAPROX) 220 MG tablet Take 220 mg by mouth 2 (two) times daily as needed. Reported on 01/30/2016     No facility-administered medications prior to visit.    PAST MEDICAL HISTORY: Past Medical History  Diagnosis Date  . Hyperlipidemia   . Hypertension   . Chronic kidney disease   . BPH (benign prostatic hyperplasia)   . Memory loss   . Thrombocytopenia ([place])   . Memory change 08/02/2015  PAST SURGICAL HISTORY: Past Surgical History  Procedure Laterality Date  . Replacement total knee Left   . Rotator cuff repair Bilateral   . Cataract extraction Bilateral   . Inguinal hernia repair Bilateral     FAMILY HISTORY: Family History  Problem Relation Age of Onset  . Ovarian cancer Mother   . Stomach cancer Father   . Parkinsonism Sister   . Dementia Neg Hx     SOCIAL HISTORY: Social History   Social History  . Marital Status: Married    Spouse Name: N/A  .  Number of Children: 2  . Years of Education: 13   Occupational History  . retired    Social History Main Topics  . Smoking status: Former Smoker    Quit date: 07/15/1977  . Smokeless tobacco: Never Used  . Alcohol Use: No  . Drug Use: No  . Sexual Activity: Not on file   Other Topics Concern  . Not on file   Social History Narrative   Patient rarely drinks caffeine.   Patient is right handed.         PHYSICAL EXAM  Filed Vitals:   01/30/16 1131  BP: 150/84  Pulse: 53  Height: 6\' 2"  (1.88 m)  Weight: 188 lb (85.276 kg)   Body mass index is 24.13 kg/(m^2).  MMSE - Mini Mental State Exam 01/30/2016 08/02/2015  Orientation to time 5 5  Orientation to Place 5 5  Registration 3 3  Attention/ Calculation 5 2  Recall 2 2  Language- name 2 objects 2 2  Language- repeat 0 1  Language- follow 3 step command 3 3  Language- read & follow direction 1 1  Write a sentence 1 0  Copy design 0 1  Total score 27 25     Generalized: Well developed, in no acute distress   Neurological examination  Mentation: Alert oriented to time, place, history taking. Follows all commands speech and language fluent Cranial nerve II-XII: Pupils were equal round reactive to light. Extraocular movements were full, visual field were full on confrontational test. Facial sensation and strength were normal. Uvula tongue midline. Head turning and shoulder shrug  were normal and symmetric. Motor: The motor testing reveals 5 over 5 strength of all 4 extremities. Good symmetric motor tone is noted throughout.  Sensory: Sensory testing is intact to soft touch on all 4 extremities. No evidence of extinction is noted.  Coordination: Cerebellar testing reveals good finger-nose-finger and heel-to-shin bilaterally.  Gait and station: Gait is normal. Tandem gait is normal. Romberg is negative. No drift is seen.  Reflexes: Deep tendon reflexes are symmetric and normal bilaterally.   DIAGNOSTIC DATA (LABS,  IMAGING, TESTING) - I reviewed patient records, labs, notes, testing and imaging myself where available.    ASSESSMENT AND PLAN 82 y.o. year old male  has a past medical history of Hyperlipidemia; Hypertension; Chronic kidney disease; BPH (benign prostatic hyperplasia); Memory loss; Thrombocytopenia ([place]); and Memory change (08/02/2015). here with:  1. Mild memory disturbance.  Overall the patient's memory score has remained stable. He will remain on Aricept 10 mg daily at bedtime. I have provided the patient with a prescription for a 90 day supply with 3 refills. He will have this filled at [facility]. Patient advised that if his symptoms worsen or he develops any new symptoms he should let us know. He will follow-up in 6 months or sooner if needed.   Ward Givens, MSN, NP-C 01/30/2016, 11:37 AM [facility] [phone]  [address], [place], [place] [address] [phone]

## 2016-01-30 NOTE — Progress Notes (Signed)
I have read the note, and I agree with the clinical assessment and plan.  Greysin Medlen KEITH   

## 2016-05-02 DIAGNOSIS — C61 Malignant neoplasm of prostate: Secondary | ICD-10-CM | POA: Diagnosis not present

## 2016-07-02 DIAGNOSIS — R413 Other amnesia: Secondary | ICD-10-CM | POA: Diagnosis not present

## 2016-07-02 DIAGNOSIS — N400 Enlarged prostate without lower urinary tract symptoms: Secondary | ICD-10-CM | POA: Diagnosis not present

## 2016-07-02 DIAGNOSIS — Z1211 Encounter for screening for malignant neoplasm of colon: Secondary | ICD-10-CM | POA: Diagnosis not present

## 2016-07-02 DIAGNOSIS — Z0000 Encounter for general adult medical examination without abnormal findings: Secondary | ICD-10-CM | POA: Diagnosis not present

## 2016-07-02 DIAGNOSIS — N183 Chronic kidney disease, stage 3 (moderate): Secondary | ICD-10-CM | POA: Diagnosis not present

## 2016-07-02 DIAGNOSIS — E782 Mixed hyperlipidemia: Secondary | ICD-10-CM | POA: Diagnosis not present

## 2016-07-02 DIAGNOSIS — I10 Essential (primary) hypertension: Secondary | ICD-10-CM | POA: Diagnosis not present

## 2016-07-02 DIAGNOSIS — D696 Thrombocytopenia, unspecified: Secondary | ICD-10-CM | POA: Diagnosis not present

## 2016-07-02 DIAGNOSIS — Z1389 Encounter for screening for other disorder: Secondary | ICD-10-CM | POA: Diagnosis not present

## 2016-07-02 DIAGNOSIS — H9193 Unspecified hearing loss, bilateral: Secondary | ICD-10-CM | POA: Diagnosis not present

## 2016-07-02 DIAGNOSIS — Z125 Encounter for screening for malignant neoplasm of prostate: Secondary | ICD-10-CM | POA: Diagnosis not present

## 2016-08-01 ENCOUNTER — Encounter: Payer: Self-pay | Admitting: Adult Health

## 2016-08-01 ENCOUNTER — Ambulatory Visit (INDEPENDENT_AMBULATORY_CARE_PROVIDER_SITE_OTHER): Payer: Medicare Other | Admitting: Adult Health

## 2016-08-01 VITALS — BP 154/85 | HR 52 | Ht 74.0 in | Wt 188.2 lb

## 2016-08-01 DIAGNOSIS — R413 Other amnesia: Secondary | ICD-10-CM

## 2016-08-01 NOTE — Progress Notes (Signed)
PATIENT: Kenneth [name] DOB: [birth year]  REASON FOR VISIT: follow up- memory HISTORY FROM: patient  HISTORY OF PRESENT ILLNESS: Kenneth [name] is an 82 year old male with a history of mild memory disturbance. He returns today for follow-up. He remains on Aricept 10 mg daily. Reports that he is tolerating this medication well. He denies any significant changes with his memory. He continues to live at home with his wife. Able to complete all ADLs independently. Operates a motor vehicle without difficulty. Continues to handle all the finances without difficulty. He denies any new neurological symptoms. He returns today for an evaluation.  HISTORY 01/30/16 (MM): Kenneth [name] is an 82 year old male with a history of memory disturbance. He returns today for follow-up. The patient is currently on Aricept 10 mg daily. He is tolerating the medication well. The patient feels that his memory has remained stable. He is able to complete all ADLs independently. He operates a Teacher, music without difficulty. He handles his own finances. His wife does the majority of the cooking. His wife reports that occasionally she'll ask him to complete a task and its as if he doesn't understand what she is saying. The patient contributes this to his hearing. He states he plans to have a hearing test when he returns to the [facility]. The patient also requests that I provide him with a 90 day prescription for Aricept for him to have filled at the pharmacy at [facility]. He denies any new neurological symptoms. He returns today for an evaluation. [name] HISTORY 08/02/15: Kenneth [name] is an 82 year old right-handed black male with a one-year history of some decline in memory and cognitive function. He indicates that he is having increasing problems with short-term memory, difficulty remembering names for people, and he will repeat himself frequently. He uses GPS for driving, but his wife indicates that he does have some  troubles with directions. He will occasionally forget to pay a bill. He denies any issues keeping up with medications or appointments. He will misplace things about the house on occasion. He has not had to give up anything secondary to memory. He does report some generalized fatigue, but no excessive daytime drowsiness. The wife indicates that he has snored at night for years. He denies any difficulty with balance or difficulty controlling the bowels or the bladder. He denies any numbness or weakness of the extremities. He indicates that he sleeps fairly well at night. The has had routine blood work done through his primary care physician. He is sent to this office for further evaluation   REVIEW OF SYSTEMS: Out of a complete 14 system review of symptoms, the patient complains only of the following symptoms, and all other reviewed systems are negative.  ALLERGIES: No Known Allergies  HOME MEDICATIONS: Outpatient Medications Prior to Visit  Medication Sig Dispense Refill  . amLODipine-benazepril (LOTREL) 10-40 MG per capsule Take 1 capsule by mouth daily.    [name] aspirin 81 MG tablet Take 81 mg by mouth daily.    [name] atorvastatin (LIPITOR) 40 MG tablet Take 40 mg by mouth daily.    [name] donepezil (ARICEPT) 10 MG tablet Take 1 tablet (10 mg total) by mouth at bedtime. 90 tablet 3  . finasteride (PROSCAR) 5 MG tablet Take 5 mg by mouth daily.    . Multiple Vitamins-Minerals (CENTRUM SILVER PO) Take 1 tablet by mouth daily.    . naproxen sodium (ANAPROX) 220 MG tablet Take 220 mg by mouth 2 (two) times daily  as needed. Reported on 01/30/2016    . Omega-3 Fatty Acids (OMEGA 3 PO) Take 1 tablet by mouth daily.     No facility-administered medications prior to visit.     PAST MEDICAL HISTORY: Past Medical History:  Diagnosis Date  . BPH (benign prostatic hyperplasia)   . Chronic kidney disease   . Hyperlipidemia   . Hypertension   . Memory change 08/02/2015  . Memory loss   . Thrombocytopenia ([place])      PAST SURGICAL HISTORY: Past Surgical History:  Procedure Laterality Date  . CATARACT EXTRACTION Bilateral   . INGUINAL HERNIA REPAIR Bilateral   . REPLACEMENT TOTAL KNEE Left   . ROTATOR CUFF REPAIR Bilateral     FAMILY HISTORY: Family History  Problem Relation Age of Onset  . Ovarian cancer Mother   . Stomach cancer Father   . Parkinsonism Sister   . Dementia Neg Hx     SOCIAL HISTORY: Social History   Social History  . Marital status: Married    Spouse name: N/A  . Number of children: 2  . Years of education: 13   Occupational History  . retired    Social History Main Topics  . Smoking status: Former Smoker    Quit date: 07/15/1977  . Smokeless tobacco: Never Used  . Alcohol use No  . Drug use: No  . Sexual activity: Not on file   Other Topics Concern  . Not on file   Social History Narrative   Patient rarely drinks caffeine.   Patient is right handed.         PHYSICAL EXAM  Vitals:   08/01/16 0911  BP: (!) 154/85  Pulse: (!) 52  Weight: 188 lb 3.2 oz (85.4 kg)  Height: 6' 2"  (1.88 m)   Body mass index is 24.16 kg/m.  MMSE - Mini Mental State Exam 08/01/2016 01/30/2016 08/02/2015  Orientation to time 5 5 5   Orientation to Place 5 5 5   Registration 3 3 3   Attention/ Calculation 4 5 2   Recall 2 2 2   Language- name 2 objects 2 2 2   Language- repeat 1 0 1  Language- follow 3 step command 3 3 3   Language- read & follow direction 1 1 1   Write a sentence 1 1 0  Copy design 1 0 1  Total score 28 27 25      Generalized: Well developed, in no acute distress   Neurological examination  Mentation: Alert oriented to time, place, history taking. Follows all commands speech and language fluent Cranial nerve II-XII: Pupils were equal round reactive to light. Extraocular movements were full, visual field were full on confrontational test. Facial sensation and strength were normal. Uvula tongue midline. Head turning and shoulder shrug  were normal and  symmetric. Motor: The motor testing reveals 5 over 5 strength of all 4 extremities. Good symmetric motor tone is noted throughout.  Sensory: Sensory testing is intact to soft touch on all 4 extremities. No evidence of extinction is noted.  Coordination: Cerebellar testing reveals good finger-nose-finger and heel-to-shin bilaterally.  Gait and station: Gait is normal. Tandem gait is normal. Romberg is negative. No drift is seen.  Reflexes: Deep tendon reflexes are symmetric and normal bilaterally.   DIAGNOSTIC DATA (LABS, IMAGING, TESTING) - I reviewed patient records, labs, notes, testing and imaging myself where available.  Lab Results  Component Value Date   WBC 10.8 (H) 04/23/2008   HGB 13.8 12/16/2009   HCT 34.0 (L) 04/23/2008   MCV  90.6 04/23/2008   PLT 122 (L) 04/23/2008      Component Value Date/Time   NA 143 12/14/2009 1230   K 4.7 12/14/2009 1230   CL 107 12/14/2009 1230   CO2 28 12/14/2009 1230   GLUCOSE 102 (H) 12/14/2009 1230   BUN 15 12/14/2009 1230   CREATININE 1.20 12/14/2009 1230   CALCIUM 9.3 12/14/2009 1230   PROT 7.9 04/15/2008 1512   ALBUMIN 4.1 04/15/2008 1512   AST 32 04/15/2008 1512   ALT 26 04/15/2008 1512   ALKPHOS 73 04/15/2008 1512   BILITOT 0.8 04/15/2008 1512   GFRNONAA 59 (L) 12/14/2009 1230   GFRAA  12/14/2009 1230    >60        The eGFR has been calculated using the MDRD equation. This calculation has not been validated in all clinical situations. eGFR's persistently <60 mL/min signify possible Chronic Kidney Disease.      ASSESSMENT AND PLAN 82 y.o. year old male  has a past medical history of BPH (benign prostatic hyperplasia); Chronic kidney disease; Hyperlipidemia; Hypertension; Memory change (08/02/2015); Memory loss; and Thrombocytopenia ([place]). here with:  1. Mild memory disturbance  The patient's memory score has ranged stable. MMSE today 27 out of 30. He will continue on Aricept 10 mg at bedtime. Patient advised that if his  symptoms worsen or he develops any new symptoms he should let us know. Will follow-up in 6 months with Dr. Jannifer Franklin.     Ward Givens, MSN, NP-C 08/01/2016, 9:25 AM [facility] [address], [place],  [address] [phone]

## 2016-08-01 NOTE — Patient Instructions (Signed)
Memory score has remained stable. Continue Aricept 10 mg at bedtime If your symptoms worsen or you develop new symptoms please let us know.

## 2016-10-25 ENCOUNTER — Telehealth: Payer: Self-pay | Admitting: Adult Health

## 2016-10-25 NOTE — Telephone Encounter (Signed)
Attempted to call patient on only number listed to discuss previous phone call. Phone immediately rang busy. Will try to reach later.

## 2016-10-25 NOTE — Telephone Encounter (Signed)
Patient returned call, stated he was at [facility] today and was told by the commander he needed to see a psychiatrist due to PTSD from his service in [facility]. This RN advised that he would probably receive the best benefit being served by a psychiatrist that the [place] recommends. That provider would have more experience and resources to counsel him since  much of their focus would be helping veterans with the same or similar issues. The patient stated he would go back tomorrow to discuss with [facility]. He verbalized understanding, appreciation, stated that made more sense to him. This RN advised he please call back if after speaking with [facility] he had further needs. He expressed appreciation.

## 2016-10-25 NOTE — Telephone Encounter (Signed)
Patient is calling to discuss being referred to a psychiatrist.

## 2016-10-30 DIAGNOSIS — Z23 Encounter for immunization: Secondary | ICD-10-CM | POA: Diagnosis not present

## 2016-11-12 DIAGNOSIS — R972 Elevated prostate specific antigen [PSA]: Secondary | ICD-10-CM | POA: Diagnosis not present

## 2016-11-12 DIAGNOSIS — N5201 Erectile dysfunction due to arterial insufficiency: Secondary | ICD-10-CM | POA: Diagnosis not present

## 2017-01-29 ENCOUNTER — Ambulatory Visit: Payer: Medicare Other | Admitting: Neurology

## 2017-01-29 ENCOUNTER — Telehealth: Payer: Self-pay | Admitting: Neurology

## 2017-01-29 NOTE — Telephone Encounter (Signed)
This patient did not show for a revisit appointment today. 

## 2017-01-30 ENCOUNTER — Encounter: Payer: Self-pay | Admitting: Neurology

## 2017-05-06 ENCOUNTER — Inpatient Hospital Stay: Admit: 2017-05-06 | Payer: MEDICARE | Primary: Internal Medicine

## 2017-05-06 ENCOUNTER — Ambulatory Visit: Admit: 2017-05-06 | Discharge: 2017-05-06 | Payer: MEDICARE | Attending: Family | Primary: Internal Medicine

## 2017-05-06 DIAGNOSIS — I10 Essential (primary) hypertension: Secondary | ICD-10-CM

## 2017-05-06 DIAGNOSIS — R413 Other amnesia: Secondary | ICD-10-CM | POA: Diagnosis not present

## 2017-05-06 DIAGNOSIS — K5900 Constipation, unspecified: Secondary | ICD-10-CM | POA: Diagnosis not present

## 2017-05-06 DIAGNOSIS — Z135 Encounter for screening for eye and ear disorders: Secondary | ICD-10-CM | POA: Diagnosis not present

## 2017-05-06 DIAGNOSIS — Z23 Encounter for immunization: Secondary | ICD-10-CM | POA: Diagnosis not present

## 2017-05-06 DIAGNOSIS — J302 Other seasonal allergic rhinitis: Secondary | ICD-10-CM | POA: Diagnosis not present

## 2017-05-06 DIAGNOSIS — E785 Hyperlipidemia, unspecified: Secondary | ICD-10-CM | POA: Diagnosis not present

## 2017-05-06 DIAGNOSIS — N401 Enlarged prostate with lower urinary tract symptoms: Secondary | ICD-10-CM | POA: Diagnosis not present

## 2017-05-06 LAB — CBC WITH AUTOMATED DIFF
ABS. BASOPHILS: 0 10*3/uL (ref 0.0–0.06)
ABS. EOSINOPHILS: 0.1 10*3/uL (ref 0.0–0.4)
ABS. LYMPHOCYTES: 2 10*3/uL (ref 0.9–3.6)
ABS. MONOCYTES: 0.4 10*3/uL (ref 0.05–1.2)
ABS. NEUTROPHILS: 3 10*3/uL (ref 1.8–8.0)
BASOPHILS: 1 % (ref 0–2)
EOSINOPHILS: 1 % (ref 0–5)
HCT: 42.2 % (ref 36.0–48.0)
HGB: 13.9 g/dL (ref 13.0–16.0)
LYMPHOCYTES: 37 % (ref 21–52)
MCH: 29.7 PG (ref 24.0–34.0)
MCHC: 32.9 g/dL (ref 31.0–37.0)
MCV: 90.2 FL (ref 74.0–97.0)
MONOCYTES: 7 % (ref 3–10)
MPV: 12.3 FL — ABNORMAL HIGH (ref 9.2–11.8)
NEUTROPHILS: 54 % (ref 40–73)
PLATELET: 149 10*3/uL (ref 135–420)
RBC: 4.68 M/uL — ABNORMAL LOW (ref 4.70–5.50)
RDW: 14.8 % — ABNORMAL HIGH (ref 11.6–14.5)
WBC: 5.4 10*3/uL (ref 4.6–13.2)

## 2017-05-06 MED ORDER — DONEPEZIL 10 MG TAB
10 mg | ORAL_TABLET | Freq: Every evening | ORAL | 1 refills | Status: DC
Start: 2017-05-06 — End: 2018-09-02

## 2017-05-06 MED ORDER — AMLODIPINE-BENAZEPRIL 10 MG-40 MG CAP
10-40 mg | ORAL_CAPSULE | Freq: Every day | ORAL | 1 refills | Status: DC
Start: 2017-05-06 — End: 2017-10-30

## 2017-05-06 MED ORDER — FINASTERIDE 5 MG TAB
5 mg | ORAL_TABLET | Freq: Every day | ORAL | 1 refills | Status: DC
Start: 2017-05-06 — End: 2017-10-30

## 2017-05-06 MED ORDER — ATORVASTATIN 40 MG TAB
40 mg | ORAL_TABLET | Freq: Every day | ORAL | 1 refills | Status: DC
Start: 2017-05-06 — End: 2018-09-02

## 2017-05-06 MED ORDER — VARICELLA-ZOSTER GLYCOE VACC-AS01B ADJ(PF) 50 MCG/0.5 ML IM SUSPENSION
50 mcg/0.5 mL | Freq: Once | INTRAMUSCULAR | 1 refills | Status: AC
Start: 2017-05-06 — End: 2017-05-06

## 2017-05-06 MED ORDER — FEXOFENADINE 180 MG TAB
180 mg | ORAL_TABLET | Freq: Every day | ORAL | 1 refills | Status: DC
Start: 2017-05-06 — End: 2018-09-02

## 2017-05-06 MED ORDER — DOCUSATE SODIUM 100 MG CAP
100 mg | ORAL_CAPSULE | Freq: Two times a day (BID) | ORAL | 1 refills | Status: DC
Start: 2017-05-06 — End: 2017-06-28

## 2017-05-06 NOTE — Progress Notes (Signed)
Kyle Williams is a 83 y.o. male presenting today for New Patient  .       HPI:  Kyle Williams presents to the office today to establish care with the practice after recently moving to the area from [place], [place]. [name] Patient reports he is feeling well today.  He has a Pmhx for hypertension, hyperlipidemia, seasonal allergies, memory loss, constipation and BPH. He is negative for chest pain, palpitation, dyspnea, polyuria, polydipsia, urinary symptoms or abdominal pain.     Review of Systems   Constitutional: Negative for chills, fever and malaise/fatigue.   HENT: Negative for congestion and sinus pain.    Respiratory: Negative for cough and sputum production.    Cardiovascular: Negative for chest pain, palpitations and leg swelling.   Gastrointestinal: Positive for constipation. Negative for abdominal pain, diarrhea, nausea and vomiting.   Genitourinary: Negative for frequency.   Musculoskeletal: Negative for back pain, joint pain, myalgias and neck pain.       No Known Allergies    Current Outpatient Prescriptions   Medication Sig Dispense Refill   ??? aspirin delayed-release 81 mg tablet Take 81 mg by mouth daily.     ??? peg 400-propylene glycol (SYSTANE, PROPYLENE GLYCOL,) 0.4-0.3 % drop as needed.     ??? multivitamin (ONE A DAY) tablet Take 1 Tab by mouth daily.     ??? varicella-zoster recombinant, PF, (SHINGRIX, PF,) 50 mcg/0.5 mL susr injection 0.5 mL by IntraMUSCular route once for 1 dose. Repeat dose in 2-6 months 0.5 mL 1   ??? amLODIPine-benazepril (LOTREL) 10-40 mg per capsule Take 1 Cap by mouth daily. 90 Cap 1   ??? docusate sodium (COLACE) 100 mg capsule Take 1 Cap by mouth two (2) times a day. 90 Cap 1   ??? fexofenadine (ALLEGRA) 180 mg tablet Take 1 Tab by mouth daily. 90 Tab 1   ??? atorvastatin (LIPITOR) 40 mg tablet Take 1 Tab by mouth daily. 90 Tab 1   ??? finasteride (PROSCAR) 5 mg tablet Take 1 Tab by mouth daily. 90 Tab 1   ??? donepezil (ARICEPT) 10 mg tablet Take 1 Tab by mouth nightly. 90 Tab 1        Past Medical History:   Diagnosis Date   ??? Constipation    ??? H/O seasonal allergies    ??? History of BPH    ??? Hypercholesterolemia    ??? Hypertension    ??? Memory loss        Past Surgical History:   Procedure Laterality Date   ??? HX HERNIA REPAIR     ??? HX KNEE REPLACEMENT Left    ??? HX ORTHOPAEDIC      bilateral shoulder replacement   ??? HX TUMOR REMOVAL Right     shoulder        Social History     Social History   ??? Marital status: UNKNOWN     Spouse name: N/A   ??? Number of children: N/A   ??? Years of education: N/A     Occupational History   ??? Not on file.     Social History Main Topics   ??? Smoking status: Former Smoker   ??? Smokeless tobacco: Former Neurosurgeon   ??? Alcohol use No   ??? Drug use: No   ??? Sexual activity: Not Currently     Partners: Female     Other Topics Concern   ??? Not on file     Social History Narrative   ???  No narrative on file       Patient does not have an advanced directive on file    Vitals:    05/06/17 0902   BP: 120/72   Pulse: 62   Resp: 16   Temp: 96.9 ??F (36.1 ??C)   TempSrc: Tympanic   SpO2: 99%   Weight: 184 lb (83.5 kg)   Height: 6\' 2"  (1.88 m)   PainSc:   0 - No pain       Physical Exam   Constitutional: No distress.   HENT:   Right Ear: External ear normal.   Left Ear: External ear normal.   Cardiovascular: Normal rate and normal heart sounds.    Pulmonary/Chest: Effort normal and breath sounds normal.   Abdominal: Soft. Bowel sounds are normal.   Musculoskeletal: Normal range of motion. He exhibits no edema.   Lymphadenopathy:     He has no cervical adenopathy.   Neurological: He is alert.   Skin: Skin is warm and dry.   Nursing note and vitals reviewed.      No results found for any previous visit.    .No results found for any visits on 05/06/17.    Assessment / Plan:      ICD-10-CM ICD-9-CM    1. Essential hypertension I10 401.9 METABOLIC PANEL, COMPREHENSIVE      CBC WITH AUTOMATED DIFF      amLODIPine-benazepril (LOTREL) 10-40 mg per capsule    2. Hyperlipidemia, unspecified hyperlipidemia type E78.5 272.4 LIPID PANEL      atorvastatin (LIPITOR) 40 mg tablet   3. Memory loss R41.3 780.93 donepezil (ARICEPT) 10 mg tablet   4. Benign prostatic hyperplasia with lower urinary tract symptoms, symptom details unspecified N40.1 600.01 REFERRAL TO UROLOGY      finasteride (PROSCAR) 5 mg tablet   5. Seasonal allergic rhinitis, unspecified trigger J30.2 477.9 fexofenadine (ALLEGRA) 180 mg tablet   6. Glaucoma screening Z13.5 V80.1 REFERRAL TO OPHTHALMOLOGY   7. Constipation, unspecified constipation type K59.00 564.00 docusate sodium (COLACE) 100 mg capsule   8. Encounter for immunization Z23 V03.89 varicella-zoster recombinant, PF, (SHINGRIX, PF,) 50 mcg/0.5 mL susr injection     Fasting labs ordered  Refill prescription medications  Referral to Urology  Referral to Ophthalmology- Glaucoma screening  Shingrix rx    Follow-up Disposition:  Return if symptoms worsen or fail to improve.    I asked the patient if he  had any questions and answered his  questions.  The patient stated that he understands the treatment plan and agrees with the treatment plan

## 2017-05-07 LAB — LIPID PANEL
CHOL/HDL Ratio: 3 (ref 0–5.0)
Cholesterol, total: 169 MG/DL (ref <200)
HDL Cholesterol: 57 MG/DL (ref 40–60)
LDL, calculated: 91.6 MG/DL (ref 0–100)
Triglyceride: 102 MG/DL (ref <150)
VLDL, calculated: 20.4 MG/DL

## 2017-05-07 LAB — METABOLIC PANEL, COMPREHENSIVE
A-G Ratio: 1.1 (ref 0.8–1.7)
ALT (SGPT): 26 U/L (ref 16–61)
AST (SGOT): 23 U/L (ref 15–37)
Albumin: 4.1 g/dL (ref 3.4–5.0)
Alk. phosphatase: 83 U/L (ref 45–117)
Anion gap: 7 mmol/L (ref 3.0–18)
BUN/Creatinine ratio: 11 — ABNORMAL LOW (ref 12–20)
BUN: 16 MG/DL (ref 7.0–18)
Bilirubin, total: 0.5 MG/DL (ref 0.2–1.0)
CO2: 29 mmol/L (ref 21–32)
Calcium: 9.6 MG/DL (ref 8.5–10.1)
Chloride: 108 mmol/L (ref 100–108)
Creatinine: 1.42 MG/DL — ABNORMAL HIGH (ref 0.6–1.3)
GFR est AA: 58 mL/min/{1.73_m2} — ABNORMAL LOW (ref >60)
GFR est non-AA: 48 mL/min/{1.73_m2} — ABNORMAL LOW (ref >60)
Globulin: 3.7 g/dL (ref 2.0–4.0)
Glucose: 96 mg/dL (ref 74–99)
Potassium: 4 mmol/L (ref 3.5–5.5)
Protein, total: 7.8 g/dL (ref 6.4–8.2)
Sodium: 144 mmol/L (ref 136–145)

## 2017-05-24 ENCOUNTER — Encounter: Attending: Urology | Primary: Internal Medicine

## 2017-06-28 ENCOUNTER — Inpatient Hospital Stay: Admit: 2017-06-28 | Payer: MEDICARE | Primary: Internal Medicine

## 2017-06-28 ENCOUNTER — Ambulatory Visit: Admit: 2017-06-28 | Discharge: 2017-06-28 | Payer: MEDICARE | Attending: Internal Medicine | Primary: Internal Medicine

## 2017-06-28 DIAGNOSIS — R972 Elevated prostate specific antigen [PSA]: Secondary | ICD-10-CM

## 2017-06-28 DIAGNOSIS — I10 Essential (primary) hypertension: Secondary | ICD-10-CM | POA: Diagnosis not present

## 2017-06-28 DIAGNOSIS — E7800 Pure hypercholesterolemia, unspecified: Secondary | ICD-10-CM | POA: Diagnosis not present

## 2017-06-28 NOTE — Progress Notes (Signed)
The patient presents to the office today with the chief complaint of an abnormal PSA    HPI    The patient states he is here today to follow up of a previously abnormal PSA.  The patient states that this has been evaluated by a urologist in the town that he came from but he is unsure of all the testing.  He describes a cystoscopy type procedure.  The patient stated that he was instructed to have the prostate followed up "in his new town."  The patient remains on medications for hypertension and hyperlipidemia.  He is tolerating the medications well.      Review of Systems   Respiratory: Negative for shortness of breath.    Cardiovascular: Negative for chest pain and leg swelling.   Genitourinary: Positive for urgency (Mild urinary urgency).       No Known Allergies    Current Outpatient Prescriptions   Medication Sig Dispense Refill   ??? aspirin delayed-release 81 mg tablet Take 81 mg by mouth daily.     ??? peg 400-propylene glycol (SYSTANE, PROPYLENE GLYCOL,) 0.4-0.3 % drop as needed.     ??? multivitamin (ONE A DAY) tablet Take 1 Tab by mouth daily.     ??? amLODIPine-benazepril (LOTREL) 10-40 mg per capsule Take 1 Cap by mouth daily. 90 Cap 1   ??? fexofenadine (ALLEGRA) 180 mg tablet Take 1 Tab by mouth daily. 90 Tab 1   ??? atorvastatin (LIPITOR) 40 mg tablet Take 1 Tab by mouth daily. 90 Tab 1   ??? finasteride (PROSCAR) 5 mg tablet Take 1 Tab by mouth daily. 90 Tab 1   ??? donepezil (ARICEPT) 10 mg tablet Take 1 Tab by mouth nightly. 29 Tab 1       Past Medical History:   Diagnosis Date   ??? Constipation    ??? H/O seasonal allergies    ??? History of BPH    ??? Hypercholesterolemia    ??? Hypertension    ??? Memory loss        Past Surgical History:   Procedure Laterality Date   ??? HX HERNIA REPAIR     ??? HX KNEE REPLACEMENT Left    ??? HX ORTHOPAEDIC      bilateral shoulder replacement   ??? HX TUMOR REMOVAL Right     shoulder        Social History     Social History   ??? Marital status: UNKNOWN     Spouse name: N/A    ??? Number of children: N/A   ??? Years of education: N/A     Occupational History   ??? Not on file.     Social History Main Topics   ??? Smoking status: Former Smoker   ??? Smokeless tobacco: Former Systems developer   ??? Alcohol use No   ??? Drug use: No   ??? Sexual activity: Not Currently     Partners: Female     Other Topics Concern   ??? Not on file     Social History Narrative       Patient does not have an advanced directive on file    Visit Vitals   ??? BP 160/86 (BP 1 Location: Right arm, BP Patient Position: Sitting)   ??? Pulse 60   ??? Temp 97.6 ??F (36.4 ??C) (Tympanic)   ??? Resp 16   ??? Ht '6\' 2"'  (1.88 m)   ??? Wt 184 lb (83.5 kg)   ??? SpO2 99%   ??? BMI  23.62 kg/m2       Physical Exam   No Cervical Lymphadenopathy  No Supraclavicular Lymphadenopathy  Thyroid is Normal  Lungs are normal to percussion.  Clear to auscultation   Heart:  S1 S2 are normal, No gallops, No mummers  No Carotid Bruits  Abdomen:  Normal Bowel Sounds.  No tenderness. No masses.  No Hepatomegaly or Splenomegly  LE:  Strong Pedal Pulses.  No Edema      BMI:  [facility] Outpatient Visit on 06/28/2017   Component Date Value Ref Range Status   ??? Prostate Specific Ag 06/28/2017 12.0* 0.0 - 4.0 ng/mL Final   Hospital Outpatient Visit on 05/06/2017   Component Date Value Ref Range Status   ??? Sodium 05/06/2017 144  136 - 145 mmol/L Final   ??? Potassium 05/06/2017 4.0  3.5 - 5.5 mmol/L Final   ??? Chloride 05/06/2017 108  100 - 108 mmol/L Final   ??? CO2 05/06/2017 29  21 - 32 mmol/L Final   ??? Anion gap 05/06/2017 7  3.0 - 18 mmol/L Final   ??? Glucose 05/06/2017 96  74 - 99 mg/dL Final   ??? BUN 05/06/2017 16  7.0 - 18 MG/DL Final   ??? Creatinine 05/06/2017 1.42* 0.6 - 1.3 MG/DL Final   ??? BUN/Creatinine ratio 05/06/2017 11* 12 - 20   Final   ??? GFR est AA 05/06/2017 58* >60 ml/min/1.39m Final   ??? GFR est non-AA 05/06/2017 48* >60 ml/min/1.774mFinal    Comment: (NOTE)  Estimated GFR is calculated using the Modification of Diet in Renal    Disease (MDRD) Study equation, reported for both African Americans   (GFRAA) and non-African Americans (GFRNA), and normalized to 1.7328m body surface area. The physician must decide which value applies to   the patient. The MDRD study equation should only be used in   individuals age 84 32 older. It has not been validated for the   following: pregnant women, patients with serious comorbid conditions,   or on certain medications, or persons with extremes of body size,   muscle mass, or nutritional status.     ??? Calcium 05/06/2017 9.6  8.5 - 10.1 MG/DL Final   ??? Bilirubin, total 05/06/2017 0.5  0.2 - 1.0 MG/DL Final   ??? ALT (SGPT) 05/06/2017 26  16 - 61 U/L Final   ??? AST (SGOT) 05/06/2017 23  15 - 37 U/L Final   ??? Alk. phosphatase 05/06/2017 83  45 - 117 U/L Final   ??? Protein, total 05/06/2017 7.8  6.4 - 8.2 g/dL Final   ??? Albumin 05/06/2017 4.1  3.4 - 5.0 g/dL Final   ??? Globulin 05/06/2017 3.7  2.0 - 4.0 g/dL Final   ??? A-G Ratio 05/06/2017 1.1  0.8 - 1.7   Final   ??? LIPID PROFILE 05/06/2017        Final   ??? Cholesterol, total 05/06/2017 169  <200 MG/DL Final   ??? Triglyceride 05/06/2017 102  <150 MG/DL Final    Comment: The drugs N-acetylcysteine (NAC) and  Metamiszole have been found to cause falsely  low results in this chemical assay. Please  be sure to submit blood samples obtained  BEFORE administration of either of these  drugs to assure correct results.     ??? HDL Cholesterol 05/06/2017 57  40 - 60 MG/DL Final   ??? LDL, calculated 05/06/2017 91.6  0 - 100 MG/DL Final   ??? VLDL, calculated 05/06/2017 20.4  MG/DL Final   ??? CHOL/HDL Ratio 05/06/2017 3.0  0 - 5.0   Final   ??? WBC 05/06/2017 5.4  4.6 - 13.2 K/uL Final   ??? RBC 05/06/2017 4.68* 4.70 - 5.50 M/uL Final   ??? HGB 05/06/2017 13.9  13.0 - 16.0 g/dL Final   ??? HCT 05/06/2017 42.2  36.0 - 48.0 % Final   ??? MCV 05/06/2017 90.2  74.0 - 97.0 FL Final   ??? MCH 05/06/2017 29.7  24.0 - 34.0 PG Final   ??? MCHC 05/06/2017 32.9  31.0 - 37.0 g/dL Final    ??? RDW 05/06/2017 14.8* 11.6 - 14.5 % Final   ??? PLATELET 05/06/2017 149  135 - 420 K/uL Final   ??? MPV 05/06/2017 12.3* 9.2 - 11.8 FL Final   ??? NEUTROPHILS 05/06/2017 54  40 - 73 % Final   ??? LYMPHOCYTES 05/06/2017 37  21 - 52 % Final   ??? MONOCYTES 05/06/2017 7  3 - 10 % Final   ??? EOSINOPHILS 05/06/2017 1  0 - 5 % Final   ??? BASOPHILS 05/06/2017 1  0 - 2 % Final   ??? ABS. NEUTROPHILS 05/06/2017 3.0  1.8 - 8.0 K/UL Final   ??? ABS. LYMPHOCYTES 05/06/2017 2.0  0.9 - 3.6 K/UL Final   ??? ABS. MONOCYTES 05/06/2017 0.4  0.05 - 1.2 K/UL Final   ??? ABS. EOSINOPHILS 05/06/2017 0.1  0.0 - 0.4 K/UL Final   ??? ABS. BASOPHILS 05/06/2017 0.0  0.0 - 0.06 K/UL Final   ??? DF 05/06/2017 AUTOMATED    Final       .  Results for orders placed or performed during the hospital encounter of 06/28/17   PSA, DIAGNOSTIC (PROSTATE SPECIFIC AG)   Result Value Ref Range    Prostate Specific Ag 12.0 (H) 0.0 - 4.0 ng/mL       Assessment / Plan      ICD-10-CM ICD-9-CM    1. Abnormal PSA R97.20 795.89 PSA, DIAGNOSTIC (PROSTATE SPECIFIC AG)   2. Essential hypertension I10 401.9    3. Pure hypercholesterolemia E78.00 272.0        Hypertension -- stable on medications with labs ordered today  Hyperlipidemia -- on medications with labs ordered today  An abnormal PSA from the patient's history.  I will order a diagnostic PSA and try to obtain records of previous work up.  A local urology referral will be placed if warranted.   he was advised to continue his maintenance medications        Follow-up Disposition:  Return in about 4 months (around 10/28/2017).    I asked Rashawn Rayman if he has any questions and I answered the questions.  Johnney Killian states that he understands the treatment plan and agrees with the treatment plan

## 2017-06-28 NOTE — Progress Notes (Signed)
Chief Complaint   Patient presents with   ??? Well Male       Depression Screening:  PHQ over the last two weeks 06/28/2017   Little interest or pleasure in doing things Not at all   Feeling down, depressed, irritable, or hopeless Not at all   Total Score PHQ 2 0       Learning Assessment:  Learning Assessment 05/06/2017   PRIMARY LEARNER Patient   HIGHEST LEVEL OF EDUCATION - PRIMARY LEARNER  SOME COLLEGE   BARRIERS PRIMARY LEARNER NONE   CO-LEARNER CAREGIVER No   PRIMARY LANGUAGE ENGLISH   LEARNER PREFERENCE PRIMARY READING   ANSWERED BY patient   RELATIONSHIP SELF       Abuse Screening:  Abuse Screening Questionnaire 05/06/2017   Do you ever feel afraid of your partner? N   Are you in a relationship with someone who physically or mentally threatens you? N   Is it safe for you to go home? Y       Fall Risk  Fall Risk Assessment, last 12 mths 06/28/2017   Able to walk? Yes   Fall in past 12 months? No         1. Have you been to the ER, urgent care clinic since your last visit?  Hospitalized since your last visit?No    2. Have you seen or consulted any other health care providers outside of the [facility] since your last visit?  Include any pap smears or colon screening. No

## 2017-06-29 LAB — PSA, DIAGNOSTIC (PROSTATE SPECIFIC AG): Prostate Specific Ag: 12 ng/mL — ABNORMAL HIGH (ref 0.0–4.0)

## 2017-07-02 NOTE — Telephone Encounter (Signed)
Attempted to call patient per Dr Andrey CampanileWilson concerning the patients PSA results. Patient was not available and was unable to leave message on voicemail. Will continue to call

## 2017-07-02 NOTE — Telephone Encounter (Signed)
Attempted to call patient per Dr Andrey CampanileWilson concerning the patients PSA results. Patient was not available and was unable to leave message on voicemail  Will continue to call patient

## 2017-07-02 NOTE — Telephone Encounter (Signed)
I called the patient to inform him of the abnormal PSA results.  There was no answer - the call then went to voice mail with a message that "the voice is full"  My staff will continue to try to reach the patient his week.

## 2017-07-02 NOTE — Telephone Encounter (Signed)
Attempted to call patient per Dr Wilson concerning the patients PSA results. Patient was not available and was unable to leave message on voicemail. Will continue to call

## 2017-07-03 NOTE — Telephone Encounter (Signed)
Attempted to contact patient concerning PSA results patient   A letter has been mailed

## 2017-07-10 NOTE — Telephone Encounter (Signed)
Attempted to call patient about PSA results patient unavailable and unable to leave message on voicemail

## 2017-07-10 NOTE — Telephone Encounter (Signed)
Attempted to contact patients son to leave message for patient that we are trying to reach patient to give PSA results left message on voicemail to return call           Dr Andrey CampanileWilson would like to speak with patient about PSA results

## 2017-07-11 NOTE — Telephone Encounter (Signed)
No answer - mailbox was full and I could not leave a message

## 2017-07-12 NOTE — Telephone Encounter (Signed)
Attempted to call patient about PSA results patients mailbox is full and unable to leave message

## 2017-07-19 ENCOUNTER — Telehealth

## 2017-07-19 NOTE — Telephone Encounter (Signed)
An attempt was made to contact patient in reference to upcoming appointment with Urology on 8/30 at 10:30 with Dr. Michail Sermon.

## 2017-07-19 NOTE — Telephone Encounter (Signed)
The patient returned our calls from over the past 2 weeks.  I invromed the patient of the result of the abnormal PSA and explained that it may indicate a growth such as a cancer in his prostate.  I recommended refer to a urologist.  The patien verbalized agreement.  A referral was placed

## 2017-07-22 NOTE — Telephone Encounter (Signed)
Several attempts have been made to contact patient in reference to upcoming appointment.  His mailbox is full, unable to leave a message.

## 2017-07-23 NOTE — Telephone Encounter (Signed)
A detailed message was left on the voice mail of patient's son regarding his upcoming appointment with urology.  He was asked to return my call for more information.

## 2017-07-23 NOTE — Telephone Encounter (Signed)
Voicemail full, unable to leave a message

## 2017-07-24 NOTE — Telephone Encounter (Signed)
Several attempts were made to reach Kyle Williams today with no success.  A message was left on his son's voice mail with complete details of his appointment tomorrow with Kyle Williams.  This is the 2nd message left on his son's voice mail in reference to this appointment.

## 2017-07-24 NOTE — Telephone Encounter (Signed)
The patient answered this call.  I informed the patient that he has a an appoint tomorrow with Dr. Michail SermonStressing.  The patient stated that he knows about the appointment because his son relayed the message.  The patient state that he will be at the appointment.  I asked the patient if this time of the night is the best time to reach him since we had trouble contacting him.  The patient stated that this time is fine but the reason that we had difficulty reaching him was because he had turned the ringer on his telephone off.

## 2017-07-24 NOTE — Telephone Encounter (Signed)
Again tried to reach the patient to inform him of his appointment tomorrow with urology.  There was no answer and the automated message stated that the voice mail was full.  We will again send a letter but it is impossible for this letter to arrive in time to notify him of the details of this appointment.

## 2017-07-25 ENCOUNTER — Ambulatory Visit: Attending: Urology | Primary: Internal Medicine

## 2017-07-25 ENCOUNTER — Ambulatory Visit: Admit: 2017-07-25 | Discharge: 2017-07-25 | Payer: MEDICARE | Attending: Urology | Primary: Internal Medicine

## 2017-07-25 DIAGNOSIS — N400 Enlarged prostate without lower urinary tract symptoms: Secondary | ICD-10-CM

## 2017-07-25 DIAGNOSIS — R972 Elevated prostate specific antigen [PSA]: Secondary | ICD-10-CM | POA: Diagnosis not present

## 2017-07-25 DIAGNOSIS — Z6823 Body mass index (BMI) 23.0-23.9, adult: Secondary | ICD-10-CM | POA: Diagnosis not present

## 2017-07-25 LAB — AMB POC URINALYSIS DIP STICK AUTO W/O MICRO
Bilirubin (UA POC): NEGATIVE
Blood (UA POC): NEGATIVE
Glucose (UA POC): NEGATIVE
Ketones (UA POC): NEGATIVE
Leukocyte esterase (UA POC): NEGATIVE
Nitrites (UA POC): NEGATIVE
Protein (UA POC): NEGATIVE
Specific gravity (UA POC): 1.02 (ref 1.001–1.035)
Urobilinogen (UA POC): 0.2 (ref 0.2–1)
pH (UA POC): 5.5 (ref 4.6–8.0)

## 2017-07-25 NOTE — Progress Notes (Signed)
The PSA has decreased from 12 and is down down to 10.9.  His percentage of free PSA is elevated so I think he has a very small chance of having prostate cancer.  I would probably continue to follow this.

## 2017-07-25 NOTE — Progress Notes (Signed)
Kyle Williams has appointment 02/10/18 for six months follow up.

## 2017-07-25 NOTE — Progress Notes (Signed)
[name] has a reminder for a "due or due soon" health maintenance. I have asked that he contact his primary care provider for follow-up on this health maintenance.    RBV. Per Dr. Beatriz ChancellorStresing lab drawn in office today for PSA free and total for elevated PSA.

## 2017-07-25 NOTE — Patient Instructions (Addendum)
Benign Prostatic Hyperplasia: Care Instructions  Your Care Instructions    Benign prostatic hyperplasia, or BPH, is an enlarged prostate gland. The prostate is a small gland that makes some of the fluid in semen. Prostate enlargement happens to almost all men as they age. It is usually not serious. BPH does not cause prostate cancer.  As the prostate gets bigger, it may partly block the flow of urine. You may have a hard time getting a urine stream started or completely stopped. BPH can cause dribbling. You may have a weak urine stream, or you may have to urinate more often than you used to, especially at night. Most men find these problems easy to manage.  You do not need treatment unless your symptoms bother you a lot or you have other problems, such as bladder infections or stones. In these cases, medicines may help. Surgery is not needed unless the urine flow is blocked or the symptoms do not get better with medicine.  Follow-up care is a key part of your treatment and safety. Be sure to make and go to all appointments, and call your doctor if you are having problems. It's also a good idea to know your test results and keep a list of the medicines you take.  How can you care for yourself at home?  ?? Take plenty of time to urinate. Try to relax.  ?? Try "double voiding." Urinate as much you can, relax for a few moments, and then try to urinate again.  ?? Sit on the toilet to urinate.  ?? Read or think of other things while you are waiting.  ?? Turn on a faucet, or try to picture running water. Some men find that this helps get their urine flowing.  ?? If dribbling is a problem, wash your penis daily to avoid skin irritation and infection.  ?? Avoid caffeine and alcohol. These drinks will increase how often you need to urinate. Spread your fluid intake throughout the day. If the urge to urinate often wakes you at night, limit your fluid intake in the evening. Urinate right before you go to bed.   ?? Many over-the-counter cold and allergy medicines can make the symptoms of BPH worse. Avoid antihistamines, decongestants, and allergy pills, if you can. Read the warnings on the package.  ?? If you take any prescription medicines, especially tranquilizers or antidepressants, ask your doctor or pharmacist whether they can cause urination problems. There may be other medicines you can use that do not cause urinary problems.  ?? Be safe with medicines. Take your medicines exactly as prescribed. Call your doctor if you think you are having a problem with your medicine.  When should you call for help?  Call your doctor now or seek immediate medical care if:  ?? ?? You cannot urinate at all.   ?? ?? You have symptoms of a urinary infection. For example:  ?? You have blood or pus in your urine.  ?? You have pain in your back just below your rib cage. This is called flank pain.  ?? You have a fever, chills, or body aches.  ?? It hurts to urinate.  ?? You have groin or belly pain.   ??Watch closely for changes in your health, and be sure to contact your doctor if:  ?? ?? It hurts when you ejaculate.   ?? ?? Your urinary problems get a lot worse or bother you a lot.   Where can you learn more?  Go to   http://www.healthwise.net/[facility].  Enter C033 in the search box to learn more about "Benign Prostatic Hyperplasia: Care Instructions."  Current as of: October 28, 2016  Content Version: 11.7  ?? 2006-2018 [employer]. Care instructions adapted under license by [employer] (which disclaims liability or warranty for this information). If you have questions about a medical condition or this instruction, always ask your healthcare professional. [employer]wise, Inc[employer] disclaims any warranty or liability for your use of this information.

## 2017-07-25 NOTE — Progress Notes (Signed)
Chief Complaint   Patient presents with   ??? New Patient   ??? Benign Prostatic Hypertrophy   ??? Elevated PSA       HISTORY OF PRESENT ILLNESS:  Kyle Williams is a 83 y.o. male who presents today for further follow-up and evaluation of a chronically elevated PSA.  In talking with him, he has had an elevation of his PSA for at least 10 years or so and according to him has had at least 2 transrectal prostate biopsies.  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  At the present time it has been several years since he had his last biopsy.  In going through the records that I have access to, [facility] [place] [facility] [place]  In 2015, his PSA was 10.88.  He also is currently taking but I do not know for how long finasteride which will usually significantly her the level of PSA.  However on this most recent occasion of August 3 of this year, his PSA was 12.0.  This is a  relatively small increase over a 3 year.  But still occurred with the presence of finasteride.  Currently he has minimal urinary symptoms with almost no nocturia.  He does have a little bit of urinary urgency from time to time but at age 83, that is acceptable.  A post void residual of him in our office shows relatively little residual.           ROS past history review of systems is all documented on the chart.    Past Medical History:   Diagnosis Date   ??? Constipation    ??? H/O seasonal allergies    ??? History of BPH    ??? Hypercholesterolemia    ??? Hypertension    ??? Memory loss        Past Surgical History:   Procedure Laterality Date   ??? HX HERNIA REPAIR     ??? HX KNEE REPLACEMENT Left    ??? HX ORTHOPAEDIC      bilateral shoulder replacement   ??? HX TUMOR REMOVAL Right     shoulder        Social History   Substance Use Topics   ??? Smoking status: Former Smoker   ??? Smokeless tobacco: Former NeurosurgeonUser   ??? Alcohol use No        No Known Allergies    Family History   Problem Relation Age of Onset   ??? Cancer Mother      ovarian   ??? Cancer Father      stomach   ??? Heart Disease Maternal Grandmother        Current Outpatient Prescriptions   Medication Sig Dispense Refill   ??? aspirin delayed-release 81 mg tablet Take 81 mg by mouth daily.     ??? peg 400-propylene glycol (SYSTANE, PROPYLENE GLYCOL,) 0.4-0.3 % drop as needed.     ??? multivitamin (ONE A DAY) tablet Take 1 Tab by mouth daily.     ??? amLODIPine-benazepril (LOTREL) 10-40 mg per capsule Take 1 Cap by mouth daily. 90 Cap 1   ??? fexofenadine (ALLEGRA) 180 mg tablet Take 1 Tab by mouth daily. 90 Tab 1   ??? atorvastatin (LIPITOR) 40 mg tablet Take 1 Tab by mouth daily. 90 Tab 1   ??? finasteride (PROSCAR) 5 mg tablet Take 1 Tab  by mouth daily. 90 Tab 1   ??? donepezil (ARICEPT) 10 mg tablet Take 1 Tab by mouth nightly. 90 Tab 1         PHYSICAL EXAMINATION:   Visit Vitals   ??? BP 156/89 (BP 1 Location: Right arm, BP Patient Position: Sitting)   ??? Pulse (!) 58   ??? Ht 6\' 2"  (1.88 m)   ??? Wt 181 lb (82.1 kg)   ??? SpO2 96%   ??? BMI 23.24 kg/m2     Constitutional: WDWN, Pleasant and appropriate affect, No acute distress.    CV:  No peripheral swelling noted  Respiratory: No respiratory distress or difficulties  Abdomen:  No abdominal masses or tenderness. No CVA tenderness. No inguinal hernias noted.   GU Male:    [id number] normal to visual inspection, no erythema or irritation, Sphincter with good tone, Rectum with no hemorrhoids, fissures or masses, his prostate is about 40 g or so in size and the right lobe is clearly larger than the left and there is a firm border of the right lateral lobe.  The left lobe is also firm but there is a notable discrepancy in the 2 lobes.    SCROTUM:  No scrotal rash or lesions noticed.  Normal bilateral testes and epididymis.   PENIS: Urethral meatus normal in location and size. No urethral discharge.  Skin: No jaundice.     Neuro/Psych:  Alert and oriented x 3, affect appropriate.   Lymphatic:   No enlarged inguinal lymph nodes.         Results for orders placed or performed in visit on 07/25/17   AMB POC URINALYSIS DIP STICK AUTO W/O MICRO   Result Value Ref Range    Color (UA POC) Yellow     Clarity (UA POC) Clear     Glucose (UA POC) Negative Negative    Bilirubin (UA POC) Negative Negative    Ketones (UA POC) Negative Negative    Specific gravity (UA POC) 1.020 1.001 - 1.035    Blood (UA POC) Negative Negative    pH (UA POC) 5.5 4.6 - 8.0    Protein (UA POC) Negative Negative    Urobilinogen (UA POC) 0.2 mg/dL 0.2 - 1    Nitrites (UA POC) Negative Negative    Leukocyte esterase (UA POC) Negative Negative     Bladder scan shows less than 25 cc postvoid residual.    REVIEW OF LABS AND IMAGING:     Imaging Report Reviewed?  YES     Images Reviewed?      YES           Other Lab Data Reviewed?   YES         ASSESSMENT:     ICD-10-CM ICD-9-CM    1. BPH with elevated PSA N40.0 600.00 AMB POC URINALYSIS DIP STICK AUTO W/O MICRO    R97.20 790.93 PR MEAS,POST-VOID RES,US,NON-IMAGING      COLLECTION VENOUS BLOOD,VENIPUNCTURE      PSA, TOTAL &  FREE            PLAN / DISCUSSION: : This is a somewhat complicated case and that he has minimal urinary symptoms but has an abnormal PSA and an abnormal prostate but he has had at least 2 biopsies which showed no evidence of malignancy.  At his age, it is unlikely that he has any undiagnosed widespread prostate malignancy.  I am going to repeat a PSA and use free and total components this time.  I have also instructed him to continue taking the finasteride.  I would also like to see him back in about 6 months and again repeat his PSA at that time.  If the PSA in the prostate nodularity are progressive, I would probably lean toward an MRI of the prostate at that time.  All of this was discussed with him.    The patient expresses understanding and agreement of the discussion and plan.     Cherrie Gauze, MD on 07/25/2017         Please note:    This document has been produced using voice recognition software. Unrecognized errors in transcription may be present.

## 2017-07-26 ENCOUNTER — Inpatient Hospital Stay: Admit: 2017-07-26 | Payer: MEDICARE | Primary: Internal Medicine

## 2017-07-27 LAB — PSA, TOTAL &  FREE
PSA, % Free: 22 %
PSA, Free: 2.4 ng/mL
Prostate Specific Ag: 10.9 ng/mL — ABNORMAL HIGH (ref 0.0–4.0)

## 2017-09-04 ENCOUNTER — Institutional Professional Consult (permissible substitution): Admit: 2017-09-04 | Payer: MEDICARE | Attending: Family | Primary: Internal Medicine

## 2017-09-04 DIAGNOSIS — Z23 Encounter for immunization: Secondary | ICD-10-CM

## 2017-09-04 NOTE — Patient Instructions (Signed)
Vaccine Information Statement    Influenza (Flu) Vaccine (Inactivated or Recombinant): What you need to know    Many Vaccine Information Statements are available in Spanish and other languages. See www.immunize.org/vis  Hojas de Informaci??n Sobre Vacunas est??n disponibles en Espa??ol y en muchos otros idiomas. Visite www.immunize.org/vis    1. Why get vaccinated?    Influenza (???flu???) is a contagious disease that spreads around the [place] every year, usually between October and May.     Flu is caused by influenza viruses, and is spread mainly by coughing, sneezing, and close contact.     Anyone can get flu. Flu strikes suddenly and can last several days. Symptoms vary by age, but can include:  ??? fever/chills  ??? sore throat  ??? muscle aches  ??? fatigue  ??? cough  ??? headache   ??? runny or stuffy nose    Flu can also lead to pneumonia and blood infections, and cause diarrhea and seizures in children.  If you have a medical condition, such as heart or lung disease, flu can make it worse.    Flu is more dangerous for some people. Infants and young children, people 65 years of age and older, pregnant women, and people with certain health conditions or a weakened immune system are at greatest risk.      Each year thousands of people in the [place] die from flu, and many more are hospitalized.     Flu vaccine can:  ??? keep you from getting flu,  ??? make flu less severe if you do get it, and  ??? keep you from spreading flu to your family and other people.     2. Inactivated and recombinant flu vaccines    A dose of flu vaccine is recommended every flu season. Children 6 months through 8 years of age may need two doses during the same flu season.  Everyone else needs only one dose each flu season.       Some inactivated flu vaccines contain a very small amount of a mercury-based preservative called thimerosal. Studies have not shown thimerosal in vaccines to be harmful, but flu vaccines that do not contain  thimerosal are available.    There is no live flu virus in flu shots.  They cannot cause the flu.     There are many flu viruses, and they are always changing. Each year a new flu vaccine is made to protect against three or four viruses that are likely to cause disease in the upcoming flu season. But even when the vaccine doesn???t exactly match these viruses, it may still provide some protection    Flu vaccine cannot prevent:  ??? flu that is caused by a virus not covered by the vaccine, or  ??? illnesses that look like flu but are not.    It takes about 2 weeks for protection to develop after vaccination, and protection lasts through the flu season.     3. Some people should not get this vaccine    Tell the person who is giving you the vaccine:    ??? If you have any severe, life-threatening allergies.    If you ever had a life-threatening allergic reaction after a dose of flu vaccine, or have a severe allergy to any part of this vaccine, you may be advised not to get vaccinated.  Most, but not all, types of flu vaccine contain a small amount of egg protein.       ??? If you   ever had Guillain-Barr?? Syndrome (also called GBS).   Some people with a history of GBS should not get this vaccine. This should be discussed with your doctor.    ??? If you are not feeling well.    It is usually okay to get flu vaccine when you have a mild illness, but you might be asked to come back when you feel better.      4. Risks of a vaccine reaction    With any medicine, including vaccines, there is a chance of reactions. These are usually mild and go away on their own, but serious reactions are also possible.     Most people who get a flu shot do not have any problems with it.     Minor problems following a flu shot include:   ??? soreness, redness, or swelling where the shot was given    ??? hoarseness  ??? sore, red or itchy eyes  ??? cough  ??? fever  ??? aches  ??? headache  ??? itching  ??? fatigue   If these problems occur, they usually begin soon after the shot and last 1 or 2 days.     More serious problems following a flu shot can include the following:    ??? There may be a small increased risk of Guillain-Barr?? Syndrome (GBS) after inactivated flu vaccine.  This risk has been estimated at 1 or 2 additional cases per million people vaccinated. This is much lower than the risk of severe complications from flu, which can be prevented by flu vaccine.      ??? Young children who get the flu shot along with pneumococcal vaccine (PCV13) and/or DTaP vaccine at the same time might be slightly more likely to have a seizure caused by fever. Ask your doctor for more information. Tell your doctor if a child who is getting flu vaccine has ever had a seizure.     Problems that could happen after any injected vaccine:     ??? People sometimes faint after a medical procedure, including vaccination. Sitting or lying down for about 15 minutes can help prevent fainting, and injuries caused by a fall. Tell your doctor if you feel dizzy, or have vision changes or ringing in the ears.    ??? Some people get severe pain in the shoulder and have difficulty moving the arm where a shot was given. This happens very rarely.    ??? Any medication can cause a severe allergic reaction. Such reactions from a vaccine are very rare, estimated at about 1 in a million doses, and would happen within a few minutes to a few hours after the vaccination.    As with any medicine, there is a very remote chance of a vaccine causing a serious injury or death.    The safety of vaccines is always being monitored. For more information, visit: www.cdc.gov/vaccinesafety/    5. What if there is a serious reaction?    What should I look for?    ??? Look for anything that concerns you, such as signs of a severe allergic reaction, very high fever, or unusual behavior.    Signs of a severe allergic reaction can include hives, swelling of the  face and throat, difficulty breathing, a fast heartbeat, dizziness, and weakness ??? usually within a few minutes to a few hours after the vaccination.    What should I do?    ??? If you think it is a severe allergic reaction or other emergency that   can???t wait, call 9-1-1 and get the person to the nearest hospital. Otherwise, call your doctor.    ??? Reactions should be reported to the Vaccine Adverse Event Reporting System (VAERS). Your doctor should file this report, or you can do it yourself through  the VAERS web site at www.vaers.hhs.gov, or by calling 1-800-822-7967.    VAERS does not give medical advice.    6. The National Vaccine Injury Compensation Program    The National Vaccine Injury Compensation Program (VICP) is a federal program that was created to compensate people who may have been injured by certain vaccines.    Persons who believe they may have been injured by a vaccine can learn about the program and about filing a claim by calling 1-800-338-2382 or visiting the VICP website at www.hrsa.gov/vaccinecompensation.  There is a time limit to file a claim for compensation.    7. How can I learn more?  ??? Ask your healthcare provider. He or she can give you the vaccine package insert or suggest other sources of information.  ??? Call your local or state health department.  ??? Contact the Centers for Disease Control and Prevention (CDC):  - Call 1-800-232-4636 (1-800-CDC-INFO) or  - Visit CDC???s website at www.cdc.gov/flu    Vaccine Information Statement   Inactivated Influenza Vaccine   07/02/2014  42 [place] ?? 300aa-[id number]    Department of Health and Human Services  Centers for Disease Control and Prevention    Office Use Only

## 2017-09-04 NOTE — Progress Notes (Signed)
Kyle Williams is a 83 y.o. male who presents for routine immunizations.   He denies any symptoms , reactions or allergies that would exclude them from being immunized today.  Risks and adverse reactions were discussed and the VIS was given to them. All questions were addressed.  He was observed for 15 min post injection. There were no reactions observed.    Simone Curia, LPN

## 2017-10-30 ENCOUNTER — Encounter

## 2017-10-30 NOTE — Telephone Encounter (Signed)
Requested Prescriptions     Pending Prescriptions Disp Refills   ??? finasteride (PROSCAR) 5 mg tablet 90 Tab 1     Sig: Take 1 Tab by mouth daily.   ??? amLODIPine-benazepril (LOTREL) 10-40 mg per capsule 90 Cap 1     Sig: Take 1 Cap by mouth daily.

## 2017-11-05 MED ORDER — FINASTERIDE 5 MG TAB
5 mg | ORAL_TABLET | Freq: Every day | ORAL | 1 refills | Status: DC
Start: 2017-11-05 — End: 2019-03-17

## 2017-11-05 MED ORDER — AMLODIPINE-BENAZEPRIL 10 MG-40 MG CAP
10-40 mg | ORAL_CAPSULE | Freq: Every day | ORAL | 1 refills | Status: DC
Start: 2017-11-05 — End: 2019-03-17

## 2018-02-10 ENCOUNTER — Encounter: Attending: Urology | Primary: Internal Medicine

## 2018-02-18 ENCOUNTER — Encounter: Attending: Urology | Primary: Internal Medicine

## 2018-06-10 ENCOUNTER — Encounter: Attending: Internal Medicine | Primary: Internal Medicine

## 2018-06-30 ENCOUNTER — Ambulatory Visit: Attending: Urology | Primary: Internal Medicine

## 2018-06-30 ENCOUNTER — Ambulatory Visit: Admit: 2018-06-30 | Discharge: 2018-06-30 | Payer: MEDICARE | Attending: Urology | Primary: Internal Medicine

## 2018-06-30 DIAGNOSIS — N400 Enlarged prostate without lower urinary tract symptoms: Secondary | ICD-10-CM

## 2018-06-30 LAB — AMB POC URINALYSIS DIP STICK AUTO W/O MICRO
Bilirubin (UA POC): NEGATIVE
Bilirubin, Urine, POC: NEGATIVE
Blood (UA POC): NEGATIVE
Blood (UA POC): NEGATIVE
Glucose (UA POC): NEGATIVE
Glucose, Urine, POC: NEGATIVE
Ketones (UA POC): NEGATIVE
Ketones, Urine, POC: NEGATIVE
Leukocyte Esterase, Urine, POC: NEGATIVE
Leukocyte esterase (UA POC): NEGATIVE
Nitrite, Urine, POC: NEGATIVE
Nitrites (UA POC): NEGATIVE
Protein (UA POC): NEGATIVE
Protein, Urine, POC: NEGATIVE
Specific Gravity, Urine, POC: 1.015 NA (ref 1.001–1.035)
Specific gravity (UA POC): 1.015 (ref 1.001–1.035)
Urobilinogen (UA POC): 0.2 (ref 0.2–1)
Urobilinogen, POC: 0.2 (ref 0.2–1)
pH (UA POC): 6 (ref 4.6–8.0)
pH, Urine, POC: 6 NA (ref 4.6–8.0)

## 2018-06-30 NOTE — Progress Notes (Signed)
Chief Complaint   Patient presents with   ??? Elevated PSA       HISTORY OF PRESENT ILLNESS:  Kyle Williams is a 84 y.o. male who presents today for follow-up of his elevated PSA.  He does have an abnormal prostate but has noted in the past history, he has had 2 prior transrectal prostate biopsies, both of which revealed no evidence of malignancy.  He currently is taking Proscar and his PSA has come down to 10 after he started taking it.  The PSA is repeated again today.  He is not having any symptoms such as weight loss back pain or trouble urinating at all.  The percentage of free PSA was quite elevated making it unlikely that he does have active prostate cancer.  At age 89, the likelihood of his dying of prostate cancer is extremely small and I have gone over the that with him and his wife.  According to his wife, who is blind, he is becoming more confused this time has gone by.  They have not been to their primary care physician to have any type of evaluation for dementia and I have encouraged them to let us go ahead and make an arrangement for that to happen.  The wife also relates to me that their daughter in [place] wants the 2 of them to come live near her where it is easier for her to take care of them.  Hopefully that will occur within the next several months.  If not I would like to see him back in December.           ROS    Past Medical History:   Diagnosis Date   ??? Constipation    ??? H/O seasonal allergies    ??? History of BPH    ??? Hypercholesterolemia    ??? Hypertension    ??? Memory loss        Past Surgical History:   Procedure Laterality Date   ??? HX HERNIA REPAIR     ??? HX KNEE REPLACEMENT Left    ??? HX ORTHOPAEDIC      bilateral shoulder replacement   ??? HX TUMOR REMOVAL Right     shoulder        Social History     Tobacco Use   ??? Smoking status: Former Smoker   ??? Smokeless tobacco: Former Estate agent Use Topics   ??? Alcohol use: No   ??? Drug use: No       No Known Allergies    Family History    Problem Relation Age of Onset   ??? Cancer Mother         ovarian   ??? Cancer Father         stomach   ??? Heart Disease Maternal Grandmother        Current Outpatient Medications   Medication Sig Dispense Refill   ??? finasteride (PROSCAR) 5 mg tablet Take 1 Tab by mouth daily. 90 Tab 1   ??? amLODIPine-benazepril (LOTREL) 10-40 mg per capsule Take 1 Cap by mouth daily. 90 Cap 1   ??? aspirin delayed-release 81 mg tablet Take 81 mg by mouth daily.     ??? peg 400-propylene glycol (SYSTANE, PROPYLENE GLYCOL,) 0.4-0.3 % drop as needed.     ??? multivitamin (ONE A DAY) tablet Take 1 Tab by mouth daily.     ??? fexofenadine (ALLEGRA) 180 mg tablet Take 1 Tab by mouth daily. (Patient taking differently: Take  180 mg by mouth daily as needed.) 90 Tab 1   ??? atorvastatin (LIPITOR) 40 mg tablet Take 1 Tab by mouth daily. 90 Tab 1   ??? donepezil (ARICEPT) 10 mg tablet Take 1 Tab by mouth nightly. 90 Tab 1       Lab Results   Component Value Date/Time    Prostate Specific Ag 10.9 (H) 07/25/2017 11:31 AM    Prostate Specific Ag 12.0 (H) 06/28/2017 12:01 PM              PHYSICAL EXAMINATION:   Visit Vitals  BP (!) 160/91   Pulse 65   Temp 97.5 ??F (36.4 ??C) (Oral)   Resp 12   Ht 6\' 2"  (1.88 m)   Wt 178 lb (80.7 kg)   SpO2 96%   BMI 22.85 kg/m??     Constitutional: WDWN, Pleasant and appropriate affect, No acute distress.    CV:  No peripheral swelling noted  Respiratory: No respiratory distress or difficulties  Abdomen:  No abdominal masses or tenderness. No CVA tenderness. No inguinal hernias noted.   GU Male:    DRE: This is deferred today but he does have an asymmetrical prostate but he again has had 2 biopsies, both of which are pathologically benign.    PENIS: Urethral meatus normal in location and size. No urethral discharge.  Skin: No jaundice.    Neuro/Psych:  Alert and oriented x 3, affect appropriate.   Lymphatic:   No enlarged inguinal lymph nodes.         Results for orders placed or performed during the hospital encounter of 07/25/17    PSA, TOTAL &  FREE   Result Value Ref Range    Prostate Specific Ag 10.9 (H) 0.0 - 4.0 ng/mL    PSA, Free 2.40 N/A ng/mL    PSA, % Free 22.0 %         REVIEW OF LABS AND IMAGING:     Imaging Report Reviewed?  NO     Images Reviewed?      NO           Other Lab Data Reviewed?  Yes         ASSESSMENT:     ICD-10-CM ICD-9-CM    1. BPH with elevated PSA N40.0 600.00 AMB POC URINALYSIS DIP STICK AUTO W/O MICRO    R97.20 790.93 PSA, TOTAL &  FREE            PLAN / DISCUSSION: : While he does have an elevated PSA, with 2- biopsies, I would recommend active surveillance at his age.  I would not recommend a biopsy at this time.  I have encouraged both of them to follow-up with Dr. Andrey CampanileWilson here for evaluation of suspected dementia on his part and his thinking about further management for Kyle Williams.  A PSA with free and total components is repeated today.    The patient expresses understanding and agreement of the discussion and plan.    Cherrie GauzeHarland Montell Leopard, MD on 06/30/2018         Please note:  Voice recognition was used to generate this report, which may have resulted in some phonetic based errors in grammar and contents. Even though attempts were made to correct all the mistakes, some may have been missed, and remained in the body of the document.

## 2018-06-30 NOTE — Progress Notes (Signed)
 ROOM # 5    Kyle Williams presents today for   Chief Complaint   Patient presents with   . Elevated PSA       Kyle Williams preferred language for health care discussion is english/other.    Is someone accompanying this pt? Yes his wife    Is the patient using any DME equipment during OV? no    Depression Screening:  3 most recent PHQ Screens 06/28/2017 05/06/2017   Little interest or pleasure in doing things Not at all Not at all   Feeling down, depressed, irritable, or hopeless Not at all Not at all   Total Score PHQ 2 0 0       Learning Assessment:  Learning Assessment 05/06/2017   PRIMARY LEARNER Patient   HIGHEST LEVEL OF EDUCATION - PRIMARY LEARNER  SOME COLLEGE   BARRIERS PRIMARY LEARNER NONE   CO-LEARNER CAREGIVER No   PRIMARY LANGUAGE ENGLISH   LEARNER PREFERENCE PRIMARY READING   ANSWERED BY patient   RELATIONSHIP SELF       Abuse Screening:  Abuse Screening Questionnaire 05/06/2017   Do you ever feel afraid of your partner? N   Are you in a relationship with someone who physically or mentally threatens you? N   Is it safe for you to go home? Y       Fall Risk  Fall Risk Assessment, last 12 mths 06/30/2018 06/28/2017 05/06/2017   Able to walk? Yes Yes Yes   Fall in past 12 months? No No No       Health Maintenance reviewed and discussed per provider. Yes    Kyle Williams is due for   Health Maintenance Due   Topic Date Due   . DTaP/Tdap/Td series (1 - Tdap) 07/15/1954   . Shingrix Vaccine Age 70> (1 of 2) 07/15/1985   . GLAUCOMA SCREENING Q2Y  07/15/2000   . Pneumococcal 65+ years (1 of 2 - PCV13) 07/15/2000   . MEDICARE YEARLY EXAM  09/05/2017   . Influenza Age 63 to Adult  06/26/2018         Please order/place referral if appropriate.      Advance Directive:  1. Do you have an advance directive in place? Patient Reply: no    2. If not, would you like material regarding how to put one in place? Patient Reply: no    Coordination of Care:  1. Have you been to the ER, urgent care clinic since your last  visit?  Hospitalized since your last visit? no    2. Have you seen or consulted any other health care providers outside of the [facility] since your last visit? Include any pap smears or colon screening. no

## 2018-06-30 NOTE — Progress Notes (Signed)
ROOM # 5    Kyle Williams presents today for   Chief Complaint   Patient presents with   ??? Elevated PSA       Kyle Williams preferred language for health care discussion is english/other.    Is someone accompanying this pt? Yes his wife    Is the patient using any DME equipment during OV? no    Depression Screening:  3 most recent PHQ Screens 06/28/2017 05/06/2017   Little interest or pleasure in doing things Not at all Not at all   Feeling down, depressed, irritable, or hopeless Not at all Not at all   Total Score PHQ 2 0 0       Learning Assessment:  Learning Assessment 05/06/2017   PRIMARY LEARNER Patient   HIGHEST LEVEL OF EDUCATION - PRIMARY LEARNER  SOME COLLEGE   BARRIERS PRIMARY LEARNER NONE   CO-LEARNER CAREGIVER No   PRIMARY LANGUAGE ENGLISH   LEARNER PREFERENCE PRIMARY READING   ANSWERED BY patient   RELATIONSHIP SELF       Abuse Screening:  Abuse Screening Questionnaire 05/06/2017   Do you ever feel afraid of your partner? N   Are you in a relationship with someone who physically or mentally threatens you? N   Is it safe for you to go home? Y       Fall Risk  Fall Risk Assessment, last 12 mths 06/30/2018 06/28/2017 05/06/2017   Able to walk? Yes Yes Yes   Fall in past 12 months? No No No       Health Maintenance reviewed and discussed per provider. Yes    [name] is due for   Health Maintenance Due   Topic Date Due   ??? DTaP/Tdap/Td series (1 - Tdap) 07/15/1956   ??? Shingrix Vaccine Age 38> (1 of 2) 07/15/1985   ??? GLAUCOMA SCREENING Q2Y  07/15/2000   ??? Pneumococcal 65+ years (1 of 2 - PCV13) 07/15/2000   ??? MEDICARE YEARLY EXAM  09/05/2017   ??? Influenza Age 489 to Adult  06/26/2018         Please order/place referral if appropriate.      Advance Directive:  1. Do you have an advance directive in place? Patient Reply: no    2. If not, would you like material regarding how to put one in place? Patient Reply: no    Coordination of Care:   1. Have you been to the ER, urgent care clinic since your last visit?  Hospitalized since your last visit? no    2. Have you seen or consulted any other health care providers outside [facility] since your last visit? Include any pap smears or colon screening. no

## 2018-06-30 NOTE — Progress Notes (Signed)
Chief Complaint   Patient presents with   ??? Elevated PSA       HISTORY OF PRESENT ILLNESS:  Kyle Williams is a 84 y.o. male who presents today for follow-up of his elevated PSA.  He does have an abnormal prostate but has noted in the past history, he has had 2 prior transrectal prostate biopsies, both of which revealed no evidence of malignancy.  He currently is taking Proscar and his PSA has come down to 10 after he started taking it.  The PSA is repeated again today.  He is not having any symptoms such as weight loss back pain or trouble urinating at all.  The percentage of free PSA was quite elevated making it unlikely that he does have active prostate cancer.  At age 84, the likelihood of his dying of prostate cancer is extremely small and I have gone over the that with him and his wife.  According to his wife, who is blind, he is becoming more confused this time has gone by.  They have not been to their primary care physician to have any type of evaluation for dementia and I have encouraged them to let us go ahead and make an arrangement for that to happen.  The wife also relates to me that their daughter in [place] wants the 2 of them to come live near her where it is easier for her to take care of them.  Hopefully that will occur within the next several months.  If not I would like to see him back in December.           ROS    Past Medical History:   Diagnosis Date   ??? Constipation    ??? H/O seasonal allergies    ??? History of BPH    ??? Hypercholesterolemia    ??? Hypertension    ??? Memory loss        Past Surgical History:   Procedure Laterality Date   ??? HX HERNIA REPAIR     ??? HX KNEE REPLACEMENT Left    ??? HX ORTHOPAEDIC      bilateral shoulder replacement   ??? HX TUMOR REMOVAL Right     shoulder        Social History     Tobacco Use   ??? Smoking status: Former Smoker   ??? Smokeless tobacco: Former User   Substance Use Topics   ??? Alcohol use: No   ??? Drug use: No       No Known Allergies    Family History    Problem Relation Age of Onset   ??? Cancer Mother         ovarian   ??? Cancer Father         stomach   ??? Heart Disease Maternal Grandmother        Current Outpatient Medications   Medication Sig Dispense Refill   ??? finasteride (PROSCAR) 5 mg tablet Take 1 Tab by mouth daily. 90 Tab 1   ??? amLODIPine-benazepril (LOTREL) 10-40 mg per capsule Take 1 Cap by mouth daily. 90 Cap 1   ??? aspirin delayed-release 81 mg tablet Take 81 mg by mouth daily.     ??? peg 400-propylene glycol (SYSTANE, PROPYLENE GLYCOL,) 0.4-0.3 % drop as needed.     ??? multivitamin (ONE A DAY) tablet Take 1 Tab by mouth daily.     ??? fexofenadine (ALLEGRA) 180 mg tablet Take 1 Tab by mouth daily. (Patient taking differently: Take   180 mg by mouth daily as needed.) 90 Tab 1   ??? atorvastatin (LIPITOR) 40 mg tablet Take 1 Tab by mouth daily. 90 Tab 1   ??? donepezil (ARICEPT) 10 mg tablet Take 1 Tab by mouth nightly. 90 Tab 1       Lab Results   Component Value Date/Time    Prostate Specific Ag 10.9 (H) 07/25/2017 11:31 AM    Prostate Specific Ag 12.0 (H) 06/28/2017 12:01 PM              PHYSICAL EXAMINATION:   Visit Vitals  BP (!) 160/91   Pulse 65   Temp 97.5 ??F (36.4 ??C) (Oral)   Resp 12   Ht 6' 2" (1.88 m)   Wt 178 lb (80.7 kg)   SpO2 96%   BMI 22.85 kg/m??     Constitutional: WDWN, Pleasant and appropriate affect, No acute distress.    CV:  No peripheral swelling noted  Respiratory: No respiratory distress or difficulties  Abdomen:  No abdominal masses or tenderness. No CVA tenderness. No inguinal hernias noted.   GU Male:    DRE: This is deferred today but he does have an asymmetrical prostate but he again has had 2 biopsies, both of which are pathologically benign.    PENIS: Urethral meatus normal in location and size. No urethral discharge.  Skin: No jaundice.    Neuro/Psych:  Alert and oriented x 3, affect appropriate.   Lymphatic:   No enlarged inguinal lymph nodes.         Results for orders placed or performed during the hospital encounter of 07/25/17    PSA, TOTAL &  FREE   Result Value Ref Range    Prostate Specific Ag 10.9 (H) 0.0 - 4.0 ng/mL    PSA, Free 2.40 N/A ng/mL    PSA, % Free 22.0 %         REVIEW OF LABS AND IMAGING:     Imaging Report Reviewed?  NO     Images Reviewed?      NO           Other Lab Data Reviewed?  Yes         ASSESSMENT:     ICD-10-CM ICD-9-CM    1. BPH with elevated PSA N40.0 600.00 AMB POC URINALYSIS DIP STICK AUTO W/O MICRO    R97.20 790.93 PSA, TOTAL &  FREE            PLAN / DISCUSSION: : While he does have an elevated PSA, with 2- biopsies, I would recommend active surveillance at his age.  I would not recommend a biopsy at this time.  I have encouraged both of them to follow-up with Dr. Wilson here for evaluation of suspected dementia on his part and his thinking about further management for [name].  A PSA with free and total components is repeated today.    The patient expresses understanding and agreement of the discussion and plan.    Legacy Lacivita, MD on 06/30/2018         Please note:  Voice recognition was used to generate this report, which may have resulted in some phonetic based errors in grammar and contents. Even though attempts were made to correct all the mistakes, some may have been missed, and remained in the body of the document.

## 2018-07-01 ENCOUNTER — Inpatient Hospital Stay: Admit: 2018-07-01 | Payer: MEDICARE | Primary: Internal Medicine

## 2018-07-01 DIAGNOSIS — N400 Enlarged prostate without lower urinary tract symptoms: Secondary | ICD-10-CM

## 2018-07-01 NOTE — Progress Notes (Signed)
Called patient. Left message to return call. Awaiting call back.

## 2018-07-01 NOTE — Progress Notes (Signed)
His PSA is back up again from what it was a year ago and he supposedly is on Proscar but I am not really sure of that.  He has had 2 previous benign biopsies.  He and his wife for supposedly moving to [place] by the end of this year.  If they do not move there to be with her daughter, he needs to have another PSA before the end of the year.

## 2018-07-01 NOTE — Progress Notes (Signed)
Kyle Williams was called today and notified of results.  Patient has not moved to [place] yet and appointment was made per Dr. Beatriz ChancellorStresing before the end of the year on 10/14/18 @11 :15am.

## 2018-07-01 NOTE — Progress Notes (Signed)
Kyle Williams was called today and notified of results.  Patient has not moved to [place] yet and appointment was made per Dr. Stresing before the end of the year on 10/14/18 @11:15am.

## 2018-07-01 NOTE — Progress Notes (Signed)
His PSA is back up again from what it was a year ago and he supposedly is on Proscar but I am not really sure of that.  He has had 2 previous benign biopsies.  He and his wife for supposedly moving to [place] by the end of this year.  If they do not move there to be with her daughter, he needs to have another PSA before the end of the year.

## 2018-07-02 LAB — PSA, TOTAL AND FREE
PSA, Free Pct: 13.7 %
PSA, Free: 3.13 ng/mL
PSA: 22.8 ng/mL — ABNORMAL HIGH (ref 0.0–4.0)

## 2018-07-02 LAB — PSA, TOTAL &  FREE
PSA, % Free: 13.7 %
PSA, Free: 3.13 ng/mL
Prostate Specific Ag: 22.8 ng/mL — ABNORMAL HIGH (ref 0.0–4.0)

## 2018-07-16 ENCOUNTER — Ambulatory Visit: Attending: Family | Primary: Internal Medicine

## 2018-07-16 ENCOUNTER — Ambulatory Visit: Admit: 2018-07-16 | Discharge: 2018-07-16 | Payer: MEDICARE | Attending: Family | Primary: Internal Medicine

## 2018-07-16 DIAGNOSIS — R413 Other amnesia: Secondary | ICD-10-CM

## 2018-07-16 MED ORDER — MEMANTINE 5 MG TAB
5 mg | ORAL_TABLET | ORAL | 1 refills | Status: DC
Start: 2018-07-16 — End: 2018-08-14

## 2018-07-16 MED ORDER — MEMANTINE 5 MG TAB
5 mg | ORAL_TABLET | ORAL | 1 refills | Status: DC
Start: 2018-07-16 — End: 2018-07-16

## 2018-07-16 NOTE — Progress Notes (Signed)
Kyle Williams is a 84 y.o. male presenting today for Memory Loss and Gout  .     HPI:  Kyle BELLEROSE presents to the office today for memory loss and gout.  Per the patient family that patient memory has been deteriorating over the last year.  The patient has been noncompliant with taking his medicines and has not had follow-up visits with his providers.  The family is concerned that the patient may have dementia or Alzheimer's.  Patient also presents today for elevated blood pressure of 172/102.  Per the family the patient has not taken his blood pressure medicine for several days.  The patient is negative for any chest pain, palpitation or lower extremity edema.    Review of Systems   Respiratory: Negative for cough and sputum production.    Cardiovascular: Negative for chest pain and palpitations.   Gastrointestinal: Negative for abdominal pain and nausea.   Genitourinary: Negative for dysuria.   Musculoskeletal: Negative for myalgias.   Neurological: Negative for dizziness and tingling.   Psychiatric/Behavioral: Positive for memory loss.       No Known Allergies    Current Outpatient Medications   Medication Sig Dispense Refill   ??? memantine (NAMENDA) 5 mg tablet Take 5 mg by mouth daily x 1 week then increase to 5 mg by mouth twice daily 60 Tab 1   ??? finasteride (PROSCAR) 5 mg tablet Take 1 Tab by mouth daily. 90 Tab 1   ??? amLODIPine-benazepril (LOTREL) 10-40 mg per capsule Take 1 Cap by mouth daily. 90 Cap 1   ??? aspirin delayed-release 81 mg tablet Take 81 mg by mouth daily.     ??? peg 400-propylene glycol (SYSTANE, PROPYLENE GLYCOL,) 0.4-0.3 % drop as needed.     ??? multivitamin (ONE A DAY) tablet Take 1 Tab by mouth daily.     ??? fexofenadine (ALLEGRA) 180 mg tablet Take 1 Tab by mouth daily. (Patient taking differently: Take 180 mg by mouth daily as needed.) 90 Tab 1   ??? atorvastatin (LIPITOR) 40 mg tablet Take 1 Tab by mouth daily. 90 Tab 1   ??? donepezil (ARICEPT) 10 mg tablet Take 1 Tab by mouth  nightly. 90 Tab 1       Past Medical History:   Diagnosis Date   ??? Constipation    ??? H/O seasonal allergies    ??? History of BPH    ??? Hypercholesterolemia    ??? Hypertension    ??? Memory loss        Past Surgical History:   Procedure Laterality Date   ??? HX HERNIA REPAIR     ??? HX KNEE REPLACEMENT Left    ??? HX ORTHOPAEDIC      bilateral shoulder replacement   ??? HX TUMOR REMOVAL Right     shoulder        Social History     Socioeconomic History   ??? Marital status: MARRIED     Spouse name: Not on file   ??? Number of children: Not on file   ??? Years of education: Not on file   ??? Highest education level: Not on file   Occupational History   ??? Not on file   Social Needs   ??? Financial resource strain: Not on file   ??? Food insecurity:     Worry: Not on file     Inability: Not on file   ??? Transportation needs:     Medical: Not on file  Non-medical: Not on file   Tobacco Use   ??? Smoking status: Former Smoker   ??? Smokeless tobacco: Former Estate agent and Sexual Activity   ??? Alcohol use: No   ??? Drug use: No   ??? Sexual activity: Not Currently     Partners: Female   Lifestyle   ??? Physical activity:     Days per week: Not on file     Minutes per session: Not on file   ??? Stress: Not on file   Relationships   ??? Social connections:     Talks on phone: Not on file     Gets together: Not on file     Attends religious service: Not on file     Active member of club or organization: Not on file     Attends meetings of clubs or organizations: Not on file     Relationship status: Not on file   ??? Intimate partner violence:     Fear of current or ex partner: Not on file     Emotionally abused: Not on file     Physically abused: Not on file     Forced sexual activity: Not on file   Other Topics Concern   ??? Not on file   Social History Narrative   ??? Not on file       Patient does not have an advanced directive on file    Vitals:    07/16/18 0832   BP: (!) 171/102   Pulse: 63   Resp: 12   Temp: 96.3 ??F (35.7 ??C)   TempSrc: Oral   SpO2: 99%    Weight: 179 lb (81.2 kg)   Height: 6\' 2"  (1.88 m)   PainSc:   0 - No pain       Physical Exam   Constitutional: No distress.   HENT:   Right Ear: External ear normal.   Left Ear: External ear normal.   Patient is wearing hearing aids bilaterally   Eyes: Pupils are equal, round, and reactive to light.   Cardiovascular: Normal rate and regular rhythm.   Pulmonary/Chest: Effort normal.   Neurological: He is alert. No cranial nerve deficit. Coordination normal.   Skin: Skin is warm and dry.   Vitals reviewed.      Hospital Outpatient Visit on 07/01/2018   Component Date Value Ref Range Status   ??? Prostate Specific Ag 07/01/2018 22.8* 0.0 - 4.0 ng/mL Final    Comment: (NOTE)  Roche ECLIA methodology.  According to the American Urological Association, Serum PSA should  decrease and remain at undetectable levels after radical  prostatectomy. The AUA defines biochemical recurrence as an initial  PSA value 0.2 ng/mL or greater followed by a subsequent  confirmatory PSA value 0.2 ng/mL or greater.  Values obtained with different assay methods or kits cannot be used  interchangeably. Results cannot be interpreted as absolute evidence  of the presence or absence of malignant disease.     ??? PSA, Free 07/01/2018 3.13  N/A ng/mL Final    Comment: (NOTE)  Roche ECLIA methodology.     ??? PSA, % Free 07/01/2018 13.7  % Final    Comment: (NOTE)  The table below lists the probability of prostate cancer for  men with non-suspicious DRE results and total PSA between  4 and 10 ng/mL, by patient age Damaris Schooner, JAMA 1998,  [id number]).                   %  Free PSA       50-64 yr        65-75 yr                   0.00-10.00%        56%             55%                  10.01-15.00%        24%             35%                  15.01-20.00%        17%             23%                  20.01-25.00%        10%             20%                       >25.00%         5%              9%  Please note:  Catalona et al did not make specific                recommendations regarding the use of               percent free PSA for any other population               of men.  Performed At: [facility] [place]  [address] [place], [place] [id number]  Jolene SchimkeNagendra Sanjai MD AO:[id number]:[phone]     Office Visit on 06/30/2018   Component Date Value Ref Range Status   ??? Color (UA POC) 06/30/2018 Yellow   Final   ??? Clarity (UA POC) 06/30/2018 Clear   Final   ??? Glucose (UA POC) 06/30/2018 Negative  Negative Final   ??? Bilirubin (UA POC) 06/30/2018 Negative  Negative Final   ??? Ketones (UA POC) 06/30/2018 Negative  Negative Final   ??? Specific gravity (UA POC) 06/30/2018 1.015  1.001 - 1.035 Final   ??? Blood (UA POC) 06/30/2018 Negative  Negative Final   ??? pH (UA POC) 06/30/2018 6.0  4.6 - 8.0 Final   ??? Protein (UA POC) 06/30/2018 Negative  Negative Final   ??? Urobilinogen (UA POC) 06/30/2018 0.2 mg/dL  0.2 - 1 Final   ??? Nitrites (UA POC) 06/30/2018 Negative  Negative Final   ??? Leukocyte esterase (UA POC) 06/30/2018 Negative  Negative Final       .No results found for any visits on 07/16/18.    Assessment / Plan:      ICD-10-CM ICD-9-CM    1. Memory deficit R41.3 780.93 REFERRAL TO NEUROLOGY      memantine (NAMENDA) 5 mg tablet      DISCONTINUED: memantine (NAMENDA) 5 mg tablet     Memory deficit per the Mini-Mental self-assessment  Namenda prescription given and instructions regarding titration.  Referral to neurology  Patient also has a history of gout's but no present flareups today.  Son instructed to follow-up with the next flareup.      Follow-up and Dispositions    ?? Return if symptoms worsen or fail to improve.         I asked the patient  if he  had any questions and answered his  questions.  The patient stated that he understands the treatment plan and agrees with the treatment plan    This document was created with a voice activated dictation system and may contain transcription errors.

## 2018-07-16 NOTE — Progress Notes (Signed)
Kyle Williams is a 84 y.o. male presenting today for Memory Loss and Gout  .     HPI:  Kyle Williams presents to the office today for memory loss and gout.  Per the patient family that patient memory has been deteriorating over the last year.  The patient has been noncompliant with taking his medicines and has not had follow-up visits with his providers.  The family is concerned that the patient may have dementia or Alzheimer's.  Patient also presents today for elevated blood pressure of 172/102.  Per the family the patient has not taken his blood pressure medicine for several days.  The patient is negative for any chest pain, palpitation or lower extremity edema.    Review of Systems   Respiratory: Negative for cough and sputum production.    Cardiovascular: Negative for chest pain and palpitations.   Gastrointestinal: Negative for abdominal pain and nausea.   Genitourinary: Negative for dysuria.   Musculoskeletal: Negative for myalgias.   Neurological: Negative for dizziness and tingling.   Psychiatric/Behavioral: Positive for memory loss.       No Known Allergies    Current Outpatient Medications   Medication Sig Dispense Refill   ??? memantine (NAMENDA) 5 mg tablet Take 5 mg by mouth daily x 1 week then increase to 5 mg by mouth twice daily 60 Tab 1   ??? finasteride (PROSCAR) 5 mg tablet Take 1 Tab by mouth daily. 90 Tab 1   ??? amLODIPine-benazepril (LOTREL) 10-40 mg per capsule Take 1 Cap by mouth daily. 90 Cap 1   ??? aspirin delayed-release 81 mg tablet Take 81 mg by mouth daily.     ??? peg 400-propylene glycol (SYSTANE, PROPYLENE GLYCOL,) 0.4-0.3 % drop as needed.     ??? multivitamin (ONE A DAY) tablet Take 1 Tab by mouth daily.     ??? fexofenadine (ALLEGRA) 180 mg tablet Take 1 Tab by mouth daily. (Patient taking differently: Take 180 mg by mouth daily as needed.) 90 Tab 1   ??? atorvastatin (LIPITOR) 40 mg tablet Take 1 Tab by mouth daily. 90 Tab 1    ??? donepezil (ARICEPT) 10 mg tablet Take 1 Tab by mouth nightly. 90 Tab 1       Past Medical History:   Diagnosis Date   ??? Constipation    ??? H/O seasonal allergies    ??? History of BPH    ??? Hypercholesterolemia    ??? Hypertension    ??? Memory loss        Past Surgical History:   Procedure Laterality Date   ??? HX HERNIA REPAIR     ??? HX KNEE REPLACEMENT Left    ??? HX ORTHOPAEDIC      bilateral shoulder replacement   ??? HX TUMOR REMOVAL Right     shoulder        Social History     Socioeconomic History   ??? Marital status: MARRIED     Spouse name: Not on file   ??? Number of children: Not on file   ??? Years of education: Not on file   ??? Highest education level: Not on file   Occupational History   ??? Not on file   Social Needs   ??? Financial resource strain: Not on file   ??? Food insecurity:     Worry: Not on file     Inability: Not on file   ??? Transportation needs:     Medical: Not on file  Non-medical: Not on file   Tobacco Use   ??? Smoking status: Former Smoker   ??? Smokeless tobacco: Former Estate agentUser   Substance and Sexual Activity   ??? Alcohol use: No   ??? Drug use: No   ??? Sexual activity: Not Currently     Partners: Female   Lifestyle   ??? Physical activity:     Days per week: Not on file     Minutes per session: Not on file   ??? Stress: Not on file   Relationships   ??? Social connections:     Talks on phone: Not on file     Gets together: Not on file     Attends religious service: Not on file     Active member of club or organization: Not on file     Attends meetings of clubs or organizations: Not on file     Relationship status: Not on file   ??? Intimate partner violence:     Fear of current or ex partner: Not on file     Emotionally abused: Not on file     Physically abused: Not on file     Forced sexual activity: Not on file   Other Topics Concern   ??? Not on file   Social History Narrative   ??? Not on file       Patient does not have an advanced directive on file    Vitals:    07/16/18 0832   BP: (!) 171/102   Pulse: 63   Resp: 12    Temp: 96.3 ??F (35.7 ??C)   TempSrc: Oral   SpO2: 99%   Weight: 179 lb (81.2 kg)   Height: 6\' 2"  (1.88 m)   PainSc:   0 - No pain       Physical Exam   Constitutional: No distress.   HENT:   Right Ear: External ear normal.   Left Ear: External ear normal.   Patient is wearing hearing aids bilaterally   Eyes: Pupils are equal, round, and reactive to light.   Cardiovascular: Normal rate and regular rhythm.   Pulmonary/Chest: Effort normal.   Neurological: He is alert. No cranial nerve deficit. Coordination normal.   Skin: Skin is warm and dry.   Vitals reviewed.      Hospital Outpatient Visit on 07/01/2018   Component Date Value Ref Range Status   ??? Prostate Specific Ag 07/01/2018 22.8* 0.0 - 4.0 ng/mL Final    Comment: (NOTE)  Roche ECLIA methodology.  According to the American Urological Association, Serum PSA should  decrease and remain at undetectable levels after radical  prostatectomy. The AUA defines biochemical recurrence as an initial  PSA value 0.2 ng/mL or greater followed by a subsequent  confirmatory PSA value 0.2 ng/mL or greater.  Values obtained with different assay methods or kits cannot be used  interchangeably. Results cannot be interpreted as absolute evidence  of the presence or absence of malignant disease.     ??? PSA, Free 07/01/2018 3.13  N/A ng/mL Final    Comment: (NOTE)  Roche ECLIA methodology.     ??? PSA, % Free 07/01/2018 13.7  % Final    Comment: (NOTE)  The table below lists the probability of prostate cancer for  men with non-suspicious DRE results and total PSA between  4 and 10 ng/mL, by patient age Damaris Schooner(Catalona et al, JAMA 1998,  [id number]).                   %  Free PSA       50-64 yr        65-75 yr                   0.00-10.00%        56%             55%                  10.01-15.00%        24%             35%                  15.01-20.00%        17%             23%                  20.01-25.00%        10%             20%                       >25.00%         5%              9%   Please note:  Catalona et al did not make specific               recommendations regarding the use of               percent free PSA for any other population               of men.  Performed At: [facility]  [address] [place], [place] [id number]  Jolene Schimke MD WU:[id number]     Office Visit on 06/30/2018   Component Date Value Ref Range Status   ??? Color (UA POC) 06/30/2018 Yellow   Final   ??? Clarity (UA POC) 06/30/2018 Clear   Final   ??? Glucose (UA POC) 06/30/2018 Negative  Negative Final   ??? Bilirubin (UA POC) 06/30/2018 Negative  Negative Final   ??? Ketones (UA POC) 06/30/2018 Negative  Negative Final   ??? Specific gravity (UA POC) 06/30/2018 1.015  1.001 - 1.035 Final   ??? Blood (UA POC) 06/30/2018 Negative  Negative Final   ??? pH (UA POC) 06/30/2018 6.0  4.6 - 8.0 Final   ??? Protein (UA POC) 06/30/2018 Negative  Negative Final   ??? Urobilinogen (UA POC) 06/30/2018 0.2 mg/dL  0.2 - 1 Final   ??? Nitrites (UA POC) 06/30/2018 Negative  Negative Final   ??? Leukocyte esterase (UA POC) 06/30/2018 Negative  Negative Final       .No results found for any visits on 07/16/18.    Assessment / Plan:      ICD-10-CM ICD-9-CM    1. Memory deficit R41.3 780.93 REFERRAL TO NEUROLOGY      memantine (NAMENDA) 5 mg tablet      DISCONTINUED: memantine (NAMENDA) 5 mg tablet     Memory deficit per the Mini-Mental self-assessment  Namenda prescription given and instructions regarding titration.  Referral to neurology  Patient also has a history of gout's but no present flareups today.  Son instructed to follow-up with the next flareup.      Follow-up and Dispositions    ?? Return if symptoms worsen or fail to improve.         I asked the patient  if he  had any questions and answered his  questions.  The patient stated that he understands the treatment plan and agrees with the treatment plan    This document was created with a voice activated dictation system and may contain transcription errors.

## 2018-07-16 NOTE — Patient Instructions (Signed)
Purine-Restricted Diet: Care Instructions  Your Care Instructions    Purines are substances that are found in some foods. Your body turns purines into uric acid. High levels of uric acid can cause gout, which is a form of arthritis that causes pain and inflammation in joints.  You may be able to help control the amount of uric acid in your body by limiting high-purine foods in your diet.  Follow-up care is a key part of your treatment and safety. Be sure to make and go to all appointments, and call your doctor if you are having problems. It's also a good idea to know your test results and keep a list of the medicines you take.  How can you care for yourself at home?  ?? Plan your meals and snacks around foods that are low in purines and are safe for you to eat. These foods include:  ? Green vegetables and tomatoes.  ? Fruits.  ? Whole-grain breads, rice, and cereals.  ? Eggs, peanut butter, and nuts.  ? Low-fat milk, cheese, and other milk products.  ? Popcorn.  ? Gelatin desserts, chocolate, cocoa, and cakes and sweets, in small amounts.  ?? You can eat certain foods that are medium-high in purines, but eat them only once in a while. These foods include:  ? Legumes, such as dried beans and dried peas. You can have 1 cup cooked legumes each day.  ? Asparagus, cauliflower, spinach, mushrooms, and green peas.  ? Fish and seafood (other than very high-purine seafood).  ? Oatmeal, wheat bran, and wheat germ.  ?? Limit very high-purine foods, including:  ? Organ meats, such as liver, kidneys, sweetbreads, and brains.  ? Meats, including bacon, beef, pork, and lamb.  ? Game meats and any other meats in large amounts.  ? Anchovies, sardines, herring, mackerel, and scallops.  ? Gravy.  ? Beer.  Where can you learn more?  Go to http://www.healthwise.net/GoodHelpConnections.  Enter F448 in the search box to learn more about "Purine-Restricted Diet: Care Instructions."  Current as of: October 02, 2017  Content Version: 12.1   ?? 2006-2019 [employer]. Care instructions adapted under license by [employer] (which disclaims liability or warranty for this information). If you have questions about a medical condition or this instruction, always ask your healthcare professional. Healthwise, Incorporated disclaims any warranty or liability for your use of this information.         Gout: Care Instructions  Your Care Instructions    Gout is a form of arthritis caused by a buildup of uric acid crystals in a joint. It causes sudden attacks of pain, swelling, redness, and stiffness, usually in one joint, especially the big toe.  Gout usually comes on without a cause. But it can be brought on by drinking alcohol (especially beer) or eating seafood and red meat. Taking certain medicines, such as diuretics or aspirin, also can bring on an attack of gout.  Taking your medicines as prescribed and following up with your doctor regularly can help you avoid gout attacks in the future.  Follow-up care is a key part of your treatment and safety. Be sure to make and go to all appointments, and call your doctor if you are having problems. It's also a good idea to know your test results and keep a list of the medicines you take.  How can you care for yourself at home?  ?? If the joint is swollen, put ice or a cold pack on the area   for 10 to 20 minutes at a time. Put a thin cloth between the ice and your skin.  ?? Prop up the sore limb on a pillow when you ice it or anytime you sit or lie down during the next 3 days. Try to keep it above the level of your heart. This will help reduce swelling.  ?? Rest sore joints. Avoid activities that put weight or strain on the joints for a few days. Take short rest breaks from your regular activities during the day.  ?? Take your medicines exactly as prescribed. Call your doctor if you think you are having a problem with your medicine.  ?? Take pain medicines exactly as directed.   ? If the doctor gave you a prescription medicine for pain, take it as prescribed.  ? If you are not taking a prescription pain medicine, ask your doctor if you can take an over-the-counter medicine.  ?? Eat less seafood and red meat.  ?? Check with your doctor before drinking alcohol.  ?? Losing weight, if you are overweight, may help reduce attacks of gout. But do not go on a "crash diet." Losing a lot of weight in a short amount of time can cause a gout attack.  When should you call for help?  Call your doctor now or seek immediate medical care if:  ?? ?? You have a fever.   ?? ?? The joint is so painful you cannot use it.   ?? ?? You have sudden, unexplained swelling, redness, warmth, or severe pain in one or more joints.   ??Watch closely for changes in your health, and be sure to contact your doctor if:  ?? ?? You have joint pain.   ?? ?? Your symptoms get worse or are not improving after 2 or 3 days.   Where can you learn more?  Go to http://www.healthwise.net/GoodHelpConnections.  Enter E531 in the search box to learn more about "Gout: Care Instructions."  Current as of: February 24, 2018  Content Version: 12.1  ?? 2006-2019 [employer]. Care instructions adapted under license by [employer] (which disclaims liability or warranty for this information). If you have questions about a medical condition or this instruction, always ask your healthcare professional. Healthwise[employer] disclaims any warranty or liability for your use of this information.

## 2018-08-14 ENCOUNTER — Encounter

## 2018-08-14 MED ORDER — MEMANTINE 5 MG TAB
5 mg | ORAL_TABLET | ORAL | 1 refills | Status: DC
Start: 2018-08-14 — End: 2018-09-02

## 2018-08-14 NOTE — Telephone Encounter (Signed)
Requested Prescriptions     Pending Prescriptions Disp Refills   ??? memantine (NAMENDA) 5 mg tablet 60 Tab 1     Sig: Take 5 mg by mouth daily x 1 week then increase to 5 mg by mouth twice daily

## 2018-09-02 ENCOUNTER — Ambulatory Visit: Attending: Family | Primary: Internal Medicine

## 2018-09-02 ENCOUNTER — Ambulatory Visit: Admit: 2018-09-02 | Discharge: 2018-09-02 | Payer: MEDICARE | Attending: Family | Primary: Internal Medicine

## 2018-09-02 DIAGNOSIS — Z0000 Encounter for general adult medical examination without abnormal findings: Secondary | ICD-10-CM

## 2018-09-02 MED ORDER — MEMANTINE 10 MG TAB
10 mg | ORAL_TABLET | Freq: Two times a day (BID) | ORAL | 3 refills | Status: DC
Start: 2018-09-02 — End: 2018-10-17

## 2018-09-02 NOTE — Progress Notes (Signed)
Visit Vitals  BP 140/90   Pulse 75   Temp 98 F (36.7 C) (Oral)   Resp 12   Ht 6\' 2"  (1.88 m)   Wt 180 lb (81.6 kg)   SpO2 98%   BMI 23.11 kg/m     Kyle Williams presents today for   Chief Complaint   Patient presents with   . Medication Evaluation   . Annual Wellness Visit       Is someone accompanying this pt? no    Is the patient using any DME equipment during OV? no  Depression Screening:  3 most recent PHQ Screens 09/02/2018   Little interest or pleasure in doing things Not at all   Feeling down, depressed, irritable, or hopeless Not at all   Total Score PHQ 2 0       Learning Assessment:  Learning Assessment 05/06/2017   PRIMARY LEARNER Patient   HIGHEST LEVEL OF EDUCATION - PRIMARY LEARNER  SOME COLLEGE   BARRIERS PRIMARY LEARNER NONE   CO-LEARNER CAREGIVER No   PRIMARY LANGUAGE ENGLISH   LEARNER PREFERENCE PRIMARY READING   ANSWERED BY patient   RELATIONSHIP SELF       Abuse Screening:  Abuse Screening Questionnaire 05/06/2017   Do you ever feel afraid of your partner? N   Are you in a relationship with someone who physically or mentally threatens you? N   Is it safe for you to go home? Y       Fall Risk  Fall Risk Assessment, last 12 mths 09/02/2018   Able to walk? Yes   Fall in past 12 months? No       Health Maintenance reviewed and discussed and ordered per Provider.    Health Maintenance Due   Topic Date Due   . DTaP/Tdap/Td series (1 - Tdap) 07/[birth year]/1956   . Shingrix Vaccine Age 58> (1 of 2) 07/15/1985   . GLAUCOMA SCREENING Q2Y  07/15/2000   . Pneumococcal 65+ years (1 of 2 - PCV13) 07/15/2000   . MEDICARE YEARLY EXAM  09/05/2017   . Influenza Age 24 to Adult  06/26/2018           Coordination of Care:  1. Have you been to the ER, urgent care clinic since your last visit? Hospitalized since your last visit? no    2. Have you seen or consulted any other health care providers outside of the [facility] since your last visit? Include any pap smears or colon screening. no

## 2018-09-02 NOTE — Progress Notes (Signed)
Kyle Williams is a 84 y.o. male presenting today for Medication Evaluation and Annual Wellness Visit  .       HPI:  Kyle Williams presents to the office today for hypertension follow-up care.  Patient was seen in the practice approximately 6 weeks ago for hypertension.  His blood pressure was 171/102 and he had stopped taking all of his medications.  Patient was instructed to resume his medication follow-up today for recheck of his blood pressure.  Patient also has a memory deficit and he was started on Namenda.  He presents today with his blood pressure controlled at 120/80.  He denies any chest pain, palpitation or lower extremity edema.  He he is taking his Namenda daily and his son he is starting to take 10 mg twice daily.    Review of Systems   Constitutional: Negative for malaise/fatigue.   Respiratory: Negative for cough, sputum production and shortness of breath.    Cardiovascular: Negative for chest pain, palpitations and leg swelling.   Gastrointestinal: Negative for abdominal pain, diarrhea, nausea and vomiting.   Genitourinary: Negative for dysuria.   Neurological: Negative for dizziness.       No Known Allergies    Current Outpatient Medications   Medication Sig Dispense Refill   ??? C/sourcherry/celery/grape seed (TART CHERRY PO) Take  by mouth.     ??? memantine (NAMENDA) 10 mg tablet Take 1 Tab by mouth two (2) times a day. 60 Tab 3   ??? finasteride (PROSCAR) 5 mg tablet Take 1 Tab by mouth daily. 90 Tab 1   ??? amLODIPine-benazepril (LOTREL) 10-40 mg per capsule Take 1 Cap by mouth daily. 90 Cap 1   ??? aspirin delayed-release 81 mg tablet Take 81 mg by mouth daily.     ??? peg 400-propylene glycol (SYSTANE, PROPYLENE GLYCOL,) 0.4-0.3 % drop as needed.     ??? multivitamin (ONE A DAY) tablet Take 1 Tab by mouth daily.         Past Medical History:   Diagnosis Date   ??? Constipation    ??? H/O seasonal allergies    ??? History of BPH    ??? Hypercholesterolemia    ??? Hypertension    ??? Memory loss        Past  Surgical History:   Procedure Laterality Date   ??? HX HERNIA REPAIR     ??? HX KNEE REPLACEMENT Left    ??? HX ORTHOPAEDIC      bilateral shoulder replacement   ??? HX TUMOR REMOVAL Right     shoulder        Social History     Socioeconomic History   ??? Marital status: MARRIED     Spouse name: Not on file   ??? Number of children: Not on file   ??? Years of education: Not on file   ??? Highest education level: Not on file   Occupational History   ??? Not on file   Social Needs   ??? Financial resource strain: Not on file   ??? Food insecurity:     Worry: Not on file     Inability: Not on file   ??? Transportation needs:     Medical: Not on file     Non-medical: Not on file   Tobacco Use   ??? Smoking status: Former Smoker   ??? Smokeless tobacco: Former Estate agentUser   Substance and Sexual Activity   ??? Alcohol use: No   ??? Drug use: No   ???  Sexual activity: Not Currently     Partners: Female   Lifestyle   ??? Physical activity:     Days per week: Not on file     Minutes per session: Not on file   ??? Stress: Not on file   Relationships   ??? Social connections:     Talks on phone: Not on file     Gets together: Not on file     Attends religious service: Not on file     Active member of club or organization: Not on file     Attends meetings of clubs or organizations: Not on file     Relationship status: Not on file   ??? Intimate partner violence:     Fear of current or ex partner: Not on file     Emotionally abused: Not on file     Physically abused: Not on file     Forced sexual activity: Not on file   Other Topics Concern   ??? Not on file   Social History Narrative   ??? Not on file       Patient does not have an advanced directive on file    Vitals:    09/02/18 0807 09/02/18 0834   BP: 140/90 120/80   Pulse: 75    Resp: 12    Temp: 98 ??F (36.7 ??C)    TempSrc: Oral    SpO2: 98%    Weight: 180 lb (81.6 kg)    Height: 6\' 2"  (1.88 m)    PainSc:   0 - No pain        Physical Exam   Constitutional: No distress.   Cardiovascular: Normal rate and intact distal pulses.  A regularly irregular rhythm present. PMI is not displaced.   No murmur heard.  Pulmonary/Chest: Effort normal and breath sounds normal.   Abdominal: Soft.   Neurological: He is alert.   Skin: Skin is warm.   Nursing note and vitals reviewed.      Hospital Outpatient Visit on 07/01/2018   Component Date Value Ref Range Status   ??? Prostate Specific Ag 07/01/2018 22.8* 0.0 - 4.0 ng/mL Final    Comment: (NOTE)  Roche ECLIA methodology.  According to the American Urological Association, Serum PSA should  decrease and remain at undetectable levels after radical  prostatectomy. The AUA defines biochemical recurrence as an initial  PSA value 0.2 ng/mL or greater followed by a subsequent  confirmatory PSA value 0.2 ng/mL or greater.  Values obtained with different assay methods or kits cannot be used  interchangeably. Results cannot be interpreted as absolute evidence  of the presence or absence of malignant disease.     ??? PSA, Free 07/01/2018 3.13  N/A ng/mL Final    Comment: (NOTE)  Roche ECLIA methodology.     ??? PSA, % Free 07/01/2018 13.7  % Final    Comment: (NOTE)  The table below lists the probability of prostate cancer for  men with non-suspicious DRE results and total PSA between  4 and 10 ng/mL, by patient age Damaris Schooner, JAMA 1998,  [id number]).                   % Free PSA       50-64 yr        65-75 yr                   0.00-10.00%        56%  55%                  10.01-15.00%        24%             35%                  15.01-20.00%        17%             23%                  20.01-25.00%        10%             20%                       >25.00%         5%              9%  Please note:  Catalona et al did not make specific               recommendations regarding the use of               percent free PSA for any other population               of men.  Performed At: [facility]  [address] [place], [place] [id number]  Jolene Schimke MD WU:[id number]     Office Visit on 06/30/2018    Component Date Value Ref Range Status   ??? Color (UA POC) 06/30/2018 Yellow   Final   ??? Clarity (UA POC) 06/30/2018 Clear   Final   ??? Glucose (UA POC) 06/30/2018 Negative  Negative Final   ??? Bilirubin (UA POC) 06/30/2018 Negative  Negative Final   ??? Ketones (UA POC) 06/30/2018 Negative  Negative Final   ??? Specific gravity (UA POC) 06/30/2018 1.015  1.001 - 1.035 Final   ??? Blood (UA POC) 06/30/2018 Negative  Negative Final   ??? pH (UA POC) 06/30/2018 6.0  4.6 - 8.0 Final   ??? Protein (UA POC) 06/30/2018 Negative  Negative Final   ??? Urobilinogen (UA POC) 06/30/2018 0.2 mg/dL  0.2 - 1 Final   ??? Nitrites (UA POC) 06/30/2018 Negative  Negative Final   ??? Leukocyte esterase (UA POC) 06/30/2018 Negative  Negative Final       .No results found for any visits on 09/02/18.    Assessment / Plan:      ICD-10-CM ICD-9-CM    1. Medicare annual wellness visit, subsequent Z00.00 V70.0    2. Essential hypertension I10 401.9 CBC WITH AUTOMATED DIFF      METABOLIC PANEL, COMPREHENSIVE   3. Hyperlipidemia, unspecified hyperlipidemia type E78.5 272.4 LIPID PANEL   4. Memory loss R41.3 780.93 memantine (NAMENDA) 10 mg tablet      REFERRAL TO NEUROLOGY   5. Encounter for immunization Z23 V03.89 INFLUENZA VACCINE INACTIVATED (IIV), SUBUNIT, ADJUVANTED, IM      ADMIN INFLUENZA VIRUS VAC   6. Pulse irregularity R09.89 785.9 AMB POC EKG ROUTINE W/ 12 LEADS, INTER & REP   7. EKG abnormalities R94.31 794.31 REFERRAL TO CARDIOLOGY       Medicare wellness done today  Fasting labs ordered  Refilled Namenda 10 mg twice daily  Patient also given a referral to neurology for memory loss  EKG done.  Reviewed with consultant provider.  Will refer patient to cardiology  Follow-up in 3 months  I asked the patient if he  had any questions and answered his  questions.  The patient stated that he understands the treatment plan and agrees with the treatment plan    This document was created with a voice activated dictation system and may contain  transcription errors.       This is the Subsequent Medicare Annual Wellness Exam, performed 12 months or more after the Initial AWV or the last Subsequent AWV    I have reviewed the patient's medical history in detail and updated the computerized patient record.     History     Past Medical History:   Diagnosis Date   ??? Constipation    ??? H/O seasonal allergies    ??? History of BPH    ??? Hypercholesterolemia    ??? Hypertension    ??? Memory loss       Past Surgical History:   Procedure Laterality Date   ??? HX HERNIA REPAIR     ??? HX KNEE REPLACEMENT Left    ??? HX ORTHOPAEDIC      bilateral shoulder replacement   ??? HX TUMOR REMOVAL Right     shoulder      Current Outpatient Medications   Medication Sig Dispense Refill   ??? C/sourcherry/celery/grape seed (TART CHERRY PO) Take  by mouth.     ??? memantine (NAMENDA) 10 mg tablet Take 1 Tab by mouth two (2) times a day. 60 Tab 3   ??? finasteride (PROSCAR) 5 mg tablet Take 1 Tab by mouth daily. 90 Tab 1   ??? amLODIPine-benazepril (LOTREL) 10-40 mg per capsule Take 1 Cap by mouth daily. 90 Cap 1   ??? aspirin delayed-release 81 mg tablet Take 81 mg by mouth daily.     ??? peg 400-propylene glycol (SYSTANE, PROPYLENE GLYCOL,) 0.4-0.3 % drop as needed.     ??? multivitamin (ONE A DAY) tablet Take 1 Tab by mouth daily.       No Known Allergies  Family History   Problem Relation Age of Onset   ??? Cancer Mother         ovarian   ??? Cancer Father         stomach   ??? Heart Disease Maternal Grandmother      Social History     Tobacco Use   ??? Smoking status: Former Smoker   ??? Smokeless tobacco: Former Estate agent Use Topics   ??? Alcohol use: No     Patient Active Problem List   Diagnosis Code   ??? Essential hypertension I10   ??? Hyperlipidemia E78.5   ??? Memory loss R41.3   ??? Benign prostatic hyperplasia N40.0   ??? Seasonal allergic rhinitis J30.2   ??? Constipation K59.00       Depression Risk Factor Screening:     3 most recent PHQ Screens 09/02/2018   Little interest or pleasure in doing things Not at  all   Feeling down, depressed, irritable, or hopeless Not at all   Total Score PHQ 2 0     Alcohol Risk Factor Screening:   You do not drink alcohol or very rarely.    Functional Ability and Level of Safety:   Hearing Loss  Hearing is good. The patient wears hearing aids.    Activities of Daily Living  The home contains: no safety equipment.  Patient does total self care    Fall Risk  Fall Risk Assessment, last 12 mths 09/02/2018   Able to walk?  Yes   Fall in past 12 months? No       Abuse Screen  Patient is not abused    Cognitive Screening   Evaluation of Cognitive Function:  Has your family/caregiver stated any concerns about your memory: no  Normal    Patient Care Team   Patient Care Team:  Paula Libra, NP as PCP - General (Nurse Practitioner)  Cherrie Gauze, MD as Physician (Urology)    Assessment/Plan   Education and counseling provided:  Are appropriate based on today's review and evaluation    Diagnoses and all orders for this visit:    1. Medicare annual wellness visit, subsequent    2. Essential hypertension  -     CBC WITH AUTOMATED DIFF; Future  -     METABOLIC PANEL, COMPREHENSIVE; Future    3. Hyperlipidemia, unspecified hyperlipidemia type  -     LIPID PANEL; Future    4. Memory loss  -     memantine (NAMENDA) 10 mg tablet; Take 1 Tab by mouth two (2) times a day.  -     REFERRAL TO NEUROLOGY    5. Encounter for immunization  -     INFLUENZA VACCINE INACTIVATED (IIV), SUBUNIT, ADJUVANTED, IM  -     ADMIN INFLUENZA VIRUS VAC    6. Pulse irregularity  -     AMB POC EKG ROUTINE W/ 12 LEADS, INTER & REP    7. EKG abnormalities  -     REFERRAL TO CARDIOLOGY        Health Maintenance Due   Topic Date Due   ??? DTaP/Tdap/Td series (1 - Tdap) 07/15/1956   ??? Shingrix Vaccine Age 82> (1 of 2) 07/15/1985   ??? GLAUCOMA SCREENING Q2Y  07/15/2000   ??? Pneumococcal 65+ years (1 of 2 - PCV13) 07/15/2000

## 2018-09-02 NOTE — Patient Instructions (Signed)
Medicare Wellness Visit, Male    The best way to live healthy is to have a lifestyle where you eat a well-balanced diet, exercise regularly, limit alcohol use, and quit all forms of tobacco/nicotine, if applicable.   Regular preventive services are another way to keep healthy. Preventive services (vaccines, screening tests, monitoring & exams) can help personalize your care plan, which helps you manage your own care. Screening tests can find health problems at the earliest stages, when they are easiest to treat.   [facility] follows the current, evidence-based guidelines published by the [place] [employer]ventive Services Task Force (USPSTF) when recommending preventive services for our patients. Because we follow these guidelines, sometimes recommendations change over time as research supports it. (For example, a prostate screening blood test is no longer routinely recommended for men with no symptoms.)  Of course, you and your doctor may decide to screen more often for some diseases, based on your risk and co-morbidities (chronic disease you are already diagnosed with).   Preventive services for you include:  - Medicare offers their members a free annual wellness visit, which is time for you and your primary care provider to discuss and plan for your preventive service needs. Take advantage of this benefit every year!  -All adults over age 82 should receive the recommended pneumonia vaccines. Current USPSTF guidelines recommend a series of two vaccines for the best pneumonia protection.   -All adults should have a flu vaccine yearly and an ECG. All adults age 82 and older should receive a shingles vaccine once in their lifetime.    -All adults age 82-70 who are overweight should have a diabetes screening test once every three years.   -Other screening tests & preventive services for persons with diabetes include: an eye exam to screen for diabetic retinopathy, a kidney function  test, a foot exam, and stricter control over your cholesterol.   -Cardiovascular screening for adults with routine risk involves an electrocardiogram (ECG) at intervals determined by the provider.   -Colorectal cancer screening should be done for adults age 82-75 with no increased risk factors for colorectal cancer.  There are a number of acceptable methods of screening for this type of cancer. Each test has its own benefits and drawbacks. Discuss with your provider what is most appropriate for you during your annual wellness visit. The different tests include: colonoscopy (considered the best screening method), a fecal occult blood test, a fecal DNA test, and sigmoidoscopy.  -All adults born between 1945 and 1965 should be screened once for Hepatitis C.  -An Abdominal Aortic Aneurysm (AAA) Screening is recommended for men age 82-75 who has ever smoked in their lifetime.     Here is a list of your current Health Maintenance items (your personalized list of preventive services) with a due date:  Health Maintenance Due   Topic Date Due   ??? DTaP/Tdap/Td  (1 - Tdap) 07/15/1956   ??? Shingles Vaccine (1 of 2) 07/15/1985   ??? Glaucoma Screening   07/15/2000   ??? Pneumococcal Vaccine (1 of 2 - PCV13) 07/15/2000   ??? Annual Well Visit  09/05/2017   ??? Flu Vaccine  06/26/2018

## 2018-09-02 NOTE — Progress Notes (Signed)
Visit Vitals  BP 140/90   Pulse 75   Temp 98 ??F (36.7 ??C) (Oral)   Resp 12   Ht 6' 2" (1.88 m)   Wt 180 lb (81.6 kg)   SpO2 98%   BMI 23.11 kg/m??     Kyle Williams presents today for   Chief Complaint   Patient presents with   ??? Medication Evaluation   ??? Annual Wellness Visit       Is someone accompanying this pt? no    Is the patient using any DME equipment during OV? no  Depression Screening:  3 most recent PHQ Screens 09/02/2018   Little interest or pleasure in doing things Not at all   Feeling down, depressed, irritable, or hopeless Not at all   Total Score PHQ 2 0       Learning Assessment:  Learning Assessment 05/06/2017   PRIMARY LEARNER Patient   HIGHEST LEVEL OF EDUCATION - PRIMARY LEARNER  SOME COLLEGE   BARRIERS PRIMARY LEARNER NONE   CO-LEARNER CAREGIVER No   PRIMARY LANGUAGE ENGLISH   LEARNER PREFERENCE PRIMARY READING   ANSWERED BY patient   RELATIONSHIP SELF       Abuse Screening:  Abuse Screening Questionnaire 05/06/2017   Do you ever feel afraid of your partner? N   Are you in a relationship with someone who physically or mentally threatens you? N   Is it safe for you to go home? Y       Fall Risk  Fall Risk Assessment, last 12 mths 09/02/2018   Able to walk? Yes   Fall in past 12 months? No       Health Maintenance reviewed and discussed and ordered per Provider.    Health Maintenance Due   Topic Date Due   ??? DTaP/Tdap/Td series (1 - Tdap) 07/15/1956   ??? Shingrix Vaccine Age 50> (1 of 2) 07/15/1985   ??? GLAUCOMA SCREENING Q2Y  07/15/2000   ??? Pneumococcal 65+ years (1 of 2 - PCV13) 07/15/2000   ??? MEDICARE YEARLY EXAM  09/05/2017   ??? Influenza Age 9 to Adult  06/26/2018           Coordination of Care:  1. Have you been to the ER, urgent care clinic since your last visit? Hospitalized since your last visit? no    2. Have you seen or consulted any other health care providers outside of the [facility] since your last visit? Include any pap smears or colon screening. no

## 2018-09-02 NOTE — Progress Notes (Signed)
CLARICE BONAVENTURE is a 84 y.o. male presenting today for Medication Evaluation and Annual Wellness Visit  .       HPI:  LUKAS PELCHER presents to the office today for hypertension follow-up care.  Patient was seen in the practice approximately 6 weeks ago for hypertension.  His blood pressure was 171/102 and he had stopped taking all of his medications.  Patient was instructed to resume his medication follow-up today for recheck of his blood pressure.  Patient also has a memory deficit and he was started on Namenda.  He presents today with his blood pressure controlled at 120/80.  He denies any chest pain, palpitation or lower extremity edema.  He he is taking his Namenda daily and his son he is starting to take 10 mg twice daily.    Review of Systems   Constitutional: Negative for malaise/fatigue.   Respiratory: Negative for cough, sputum production and shortness of breath.    Cardiovascular: Negative for chest pain, palpitations and leg swelling.   Gastrointestinal: Negative for abdominal pain, diarrhea, nausea and vomiting.   Genitourinary: Negative for dysuria.   Neurological: Negative for dizziness.       No Known Allergies    Current Outpatient Medications   Medication Sig Dispense Refill   ??? C/sourcherry/celery/grape seed (TART CHERRY PO) Take  by mouth.     ??? memantine (NAMENDA) 10 mg tablet Take 1 Tab by mouth two (2) times a day. 60 Tab 3   ??? finasteride (PROSCAR) 5 mg tablet Take 1 Tab by mouth daily. 90 Tab 1   ??? amLODIPine-benazepril (LOTREL) 10-40 mg per capsule Take 1 Cap by mouth daily. 90 Cap 1   ??? aspirin delayed-release 81 mg tablet Take 81 mg by mouth daily.     ??? peg 400-propylene glycol (SYSTANE, PROPYLENE GLYCOL,) 0.4-0.3 % drop as needed.     ??? multivitamin (ONE A DAY) tablet Take 1 Tab by mouth daily.         Past Medical History:   Diagnosis Date   ??? Constipation    ??? H/O seasonal allergies    ??? History of BPH    ??? Hypercholesterolemia    ??? Hypertension    ??? Memory loss         Past Surgical History:   Procedure Laterality Date   ??? HX HERNIA REPAIR     ??? HX KNEE REPLACEMENT Left    ??? HX ORTHOPAEDIC      bilateral shoulder replacement   ??? HX TUMOR REMOVAL Right     shoulder        Social History     Socioeconomic History   ??? Marital status: MARRIED     Spouse name: Not on file   ??? Number of children: Not on file   ??? Years of education: Not on file   ??? Highest education level: Not on file   Occupational History   ??? Not on file   Social Needs   ??? Financial resource strain: Not on file   ??? Food insecurity:     Worry: Not on file     Inability: Not on file   ??? Transportation needs:     Medical: Not on file     Non-medical: Not on file   Tobacco Use   ??? Smoking status: Former Smoker   ??? Smokeless tobacco: Former Estate agent and Sexual Activity   ??? Alcohol use: No   ??? Drug use: No   ???  Sexual activity: Not Currently     Partners: Female   Lifestyle   ??? Physical activity:     Days per week: Not on file     Minutes per session: Not on file   ??? Stress: Not on file   Relationships   ??? Social connections:     Talks on phone: Not on file     Gets together: Not on file     Attends religious service: Not on file     Active member of club or organization: Not on file     Attends meetings of clubs or organizations: Not on file     Relationship status: Not on file   ??? Intimate partner violence:     Fear of current or ex partner: Not on file     Emotionally abused: Not on file     Physically abused: Not on file     Forced sexual activity: Not on file   Other Topics Concern   ??? Not on file   Social History Narrative   ??? Not on file       Patient does not have an advanced directive on file    Vitals:    09/02/18 0807 09/02/18 0834   BP: 140/90 120/80   Pulse: 75    Resp: 12    Temp: 98 ??F (36.7 ??C)    TempSrc: Oral    SpO2: 98%    Weight: 180 lb (81.6 kg)    Height: 6\' 2"  (1.88 m)    PainSc:   0 - No pain        Physical Exam   Constitutional: No distress.    Cardiovascular: Normal rate and intact distal pulses. A regularly irregular rhythm present. PMI is not displaced.   No murmur heard.  Pulmonary/Chest: Effort normal and breath sounds normal.   Abdominal: Soft.   Neurological: He is alert.   Skin: Skin is warm.   Nursing note and vitals reviewed.      Hospital Outpatient Visit on 07/01/2018   Component Date Value Ref Range Status   ??? Prostate Specific Ag 07/01/2018 22.8* 0.0 - 4.0 ng/mL Final    Comment: (NOTE)  Roche ECLIA methodology.  According to the American Urological Association, Serum PSA should  decrease and remain at undetectable levels after radical  prostatectomy. The AUA defines biochemical recurrence as an initial  PSA value 0.2 ng/mL or greater followed by a subsequent  confirmatory PSA value 0.2 ng/mL or greater.  Values obtained with different assay methods or kits cannot be used  interchangeably. Results cannot be interpreted as absolute evidence  of the presence or absence of malignant disease.     ??? PSA, Free 07/01/2018 3.13  N/A ng/mL Final    Comment: (NOTE)  Roche ECLIA methodology.     ??? PSA, % Free 07/01/2018 13.7  % Final    Comment: (NOTE)  The table below lists the probability of prostate cancer for  men with non-suspicious DRE results and total PSA between  4 and 10 ng/mL, by patient age Damaris Schooner(Catalona et al, JAMA 1998,  [id number]).                   % Free PSA       50-64 yr        65-75 yr                   0.00-10.00%        56%  55%                  10.01-15.00%        24%             35%                  15.01-20.00%        17%             23%                  20.01-25.00%        10%             20%                       >25.00%         5%              9%  Please note:  Catalona et al did not make specific               recommendations regarding the use of               percent free PSA for any other population               of men.  Performed At: [facility]  [address] [place], [place] [id number]   Jolene Schimke MD [id number]     Office Visit on 06/30/2018   Component Date Value Ref Range Status   ??? Color (UA POC) 06/30/2018 Yellow   Final   ??? Clarity (UA POC) 06/30/2018 Clear   Final   ??? Glucose (UA POC) 06/30/2018 Negative  Negative Final   ??? Bilirubin (UA POC) 06/30/2018 Negative  Negative Final   ??? Ketones (UA POC) 06/30/2018 Negative  Negative Final   ??? Specific gravity (UA POC) 06/30/2018 1.015  1.001 - 1.035 Final   ??? Blood (UA POC) 06/30/2018 Negative  Negative Final   ??? pH (UA POC) 06/30/2018 6.0  4.6 - 8.0 Final   ??? Protein (UA POC) 06/30/2018 Negative  Negative Final   ??? Urobilinogen (UA POC) 06/30/2018 0.2 mg/dL  0.2 - 1 Final   ??? Nitrites (UA POC) 06/30/2018 Negative  Negative Final   ??? Leukocyte esterase (UA POC) 06/30/2018 Negative  Negative Final       .No results found for any visits on 09/02/18.    Assessment / Plan:      ICD-10-CM ICD-9-CM    1. Medicare annual wellness visit, subsequent Z00.00 V70.0    2. Essential hypertension I10 401.9 CBC WITH AUTOMATED DIFF      METABOLIC PANEL, COMPREHENSIVE   3. Hyperlipidemia, unspecified hyperlipidemia type E78.5 272.4 LIPID PANEL   4. Memory loss R41.3 780.93 memantine (NAMENDA) 10 mg tablet      REFERRAL TO NEUROLOGY   5. Encounter for immunization Z23 V03.89 INFLUENZA VACCINE INACTIVATED (IIV), SUBUNIT, ADJUVANTED, IM      ADMIN INFLUENZA VIRUS VAC   6. Pulse irregularity R09.89 785.9 AMB POC EKG ROUTINE W/ 12 LEADS, INTER & REP   7. EKG abnormalities R94.31 794.31 REFERRAL TO CARDIOLOGY       Medicare wellness done today  Fasting labs ordered  Refilled Namenda 10 mg twice daily  Patient also given a referral to neurology for memory loss  EKG done.  Reviewed with consultant provider.  Will refer patient to cardiology  Follow-up in 3 months  I asked the patient if he  had any questions and answered his  questions.  The patient stated that he understands the treatment plan and agrees with the treatment plan     This document was created with a voice activated dictation system and may contain transcription errors.       This is the Subsequent Medicare Annual Wellness Exam, performed 12 months or more after the Initial AWV or the last Subsequent AWV    I have reviewed the patient's medical history in detail and updated the computerized patient record.     History     Past Medical History:   Diagnosis Date   ??? Constipation    ??? H/O seasonal allergies    ??? History of BPH    ??? Hypercholesterolemia    ??? Hypertension    ??? Memory loss       Past Surgical History:   Procedure Laterality Date   ??? HX HERNIA REPAIR     ??? HX KNEE REPLACEMENT Left    ??? HX ORTHOPAEDIC      bilateral shoulder replacement   ??? HX TUMOR REMOVAL Right     shoulder      Current Outpatient Medications   Medication Sig Dispense Refill   ??? C/sourcherry/celery/grape seed (TART CHERRY PO) Take  by mouth.     ??? memantine (NAMENDA) 10 mg tablet Take 1 Tab by mouth two (2) times a day. 60 Tab 3   ??? finasteride (PROSCAR) 5 mg tablet Take 1 Tab by mouth daily. 90 Tab 1   ??? amLODIPine-benazepril (LOTREL) 10-40 mg per capsule Take 1 Cap by mouth daily. 90 Cap 1   ??? aspirin delayed-release 81 mg tablet Take 81 mg by mouth daily.     ??? peg 400-propylene glycol (SYSTANE, PROPYLENE GLYCOL,) 0.4-0.3 % drop as needed.     ??? multivitamin (ONE A DAY) tablet Take 1 Tab by mouth daily.       No Known Allergies  Family History   Problem Relation Age of Onset   ??? Cancer Mother         ovarian   ??? Cancer Father         stomach   ??? Heart Disease Maternal Grandmother      Social History     Tobacco Use   ??? Smoking status: Former Smoker   ??? Smokeless tobacco: Former Estate agent Use Topics   ??? Alcohol use: No     Patient Active Problem List   Diagnosis Code   ??? Essential hypertension I10   ??? Hyperlipidemia E78.5   ??? Memory loss R41.3   ??? Benign prostatic hyperplasia N40.0   ??? Seasonal allergic rhinitis J30.2   ??? Constipation K59.00       Depression Risk Factor Screening:      3 most recent PHQ Screens 09/02/2018   Little interest or pleasure in doing things Not at all   Feeling down, depressed, irritable, or hopeless Not at all   Total Score PHQ 2 0     Alcohol Risk Factor Screening:   You do not drink alcohol or very rarely.    Functional Ability and Level of Safety:   Hearing Loss  Hearing is good. The patient wears hearing aids.    Activities of Daily Living  The home contains: no safety equipment.  Patient does total self care    Fall Risk  Fall Risk Assessment, last 12 mths 09/02/2018   Able to walk?  Yes   Fall in past 12 months? No       Abuse Screen  Patient is not abused    Cognitive Screening   Evaluation of Cognitive Function:  Has your family/caregiver stated any concerns about your memory: no  Normal    Patient Care Team   Patient Care Team:  Paula Libra, NP as PCP - General (Nurse Practitioner)  Cherrie Gauze, MD as Physician (Urology)    Assessment/Plan   Education and counseling provided:  Are appropriate based on today's review and evaluation    Diagnoses and all orders for this visit:    1. Medicare annual wellness visit, subsequent    2. Essential hypertension  -     CBC WITH AUTOMATED DIFF; Future  -     METABOLIC PANEL, COMPREHENSIVE; Future    3. Hyperlipidemia, unspecified hyperlipidemia type  -     LIPID PANEL; Future    4. Memory loss  -     memantine (NAMENDA) 10 mg tablet; Take 1 Tab by mouth two (2) times a day.  -     REFERRAL TO NEUROLOGY    5. Encounter for immunization  -     INFLUENZA VACCINE INACTIVATED (IIV), SUBUNIT, ADJUVANTED, IM  -     ADMIN INFLUENZA VIRUS VAC    6. Pulse irregularity  -     AMB POC EKG ROUTINE W/ 12 LEADS, INTER & REP    7. EKG abnormalities  -     REFERRAL TO CARDIOLOGY        Health Maintenance Due   Topic Date Due   ??? DTaP/Tdap/Td series (1 - Tdap) 07/15/1956   ??? Shingrix Vaccine Age 59> (1 of 2) 07/15/1985   ??? GLAUCOMA SCREENING Q2Y  07/15/2000   ??? Pneumococcal 65+ years (1 of 2 - PCV13) 07/15/2000

## 2018-10-14 ENCOUNTER — Encounter: Attending: Urology | Primary: Internal Medicine

## 2018-10-17 ENCOUNTER — Encounter

## 2018-10-17 MED ORDER — MEMANTINE 10 MG TAB
10 mg | ORAL_TABLET | Freq: Two times a day (BID) | ORAL | 3 refills | Status: DC
Start: 2018-10-17 — End: 2019-03-05

## 2018-10-17 NOTE — Telephone Encounter (Signed)
Last seen 09/02/18  Next appt  11/11/18    Requested Prescriptions     Pending Prescriptions Disp Refills   ??? memantine (NAMENDA) 10 mg tablet 60 Tab 3     Sig: Take 1 Tab by mouth two (2) times a day.

## 2018-11-11 ENCOUNTER — Ambulatory Visit: Attending: Family | Primary: Internal Medicine

## 2018-11-11 ENCOUNTER — Ambulatory Visit: Admit: 2018-11-11 | Discharge: 2018-11-11 | Payer: MEDICARE | Attending: Family | Primary: Internal Medicine

## 2018-11-11 DIAGNOSIS — N400 Enlarged prostate without lower urinary tract symptoms: Secondary | ICD-10-CM

## 2018-11-11 NOTE — Progress Notes (Signed)
 Visit Vitals  BP (!) 148/94   Pulse 75   Temp 96.6 F (35.9 C) (Oral)   Resp 12   Ht 6' 2 (1.88 m)   Wt 181 lb (82.1 kg)   SpO2 97%   BMI 23.24 kg/m     Kyle Williams presents today for   Chief Complaint   Patient presents with   . Hypertension     follow-up   . Labs     patient did not get his labs done       Is someone accompanying this pt? no    Is the patient using any DME equipment during OV? no    Depression Screening:  3 most recent PHQ Screens 11/11/2018   Little interest or pleasure in doing things Not at all   Feeling down, depressed, irritable, or hopeless Not at all   Total Score PHQ 2 0       Learning Assessment:  Learning Assessment 05/06/2017   PRIMARY LEARNER Patient   HIGHEST LEVEL OF EDUCATION - PRIMARY LEARNER  SOME COLLEGE   BARRIERS PRIMARY LEARNER NONE   CO-LEARNER CAREGIVER No   PRIMARY LANGUAGE ENGLISH   LEARNER PREFERENCE PRIMARY READING   ANSWERED BY patient   RELATIONSHIP SELF       Abuse Screening:  Abuse Screening Questionnaire 05/06/2017   Do you ever feel afraid of your partner? N   Are you in a relationship with someone who physically or mentally threatens you? N   Is it safe for you to go home? Y       Fall Risk  Fall Risk Assessment, last 12 mths 11/11/2018   Able to walk? Yes   Fall in past 12 months? No       Health Maintenance reviewed and discussed and ordered per Provider.    Health Maintenance Due   Topic Date Due   . DTaP/Tdap/Td series (1 - Tdap) 07[birth year]/1946   . Shingrix Vaccine Age 18> (1 of 2) 07/15/1985   . GLAUCOMA SCREENING Q2Y  07/15/2000   . Pneumococcal 65+ years (1 of 1 - PPSV23) 07/15/2000           Coordination of Care:  1. Have you been to the ER, urgent care clinic since your last visit? Hospitalized since your last visit? no    2. Have you seen or consulted any other health care providers outside of the [facility] since your last visit? Include any pap smears or colon screening. no

## 2018-11-11 NOTE — Progress Notes (Signed)
Kyle Williams is a 84 y.o. male presenting today for Hypertension (follow-up) and Labs (patient did not get his labs done)    HPI:  Kyle Williams presents to the office today for hypertension follow-up care. He present today feeling well. His blood pressure is controlled at 124/80 and is negative for chest pain, palpation or lower extremity edema.  Per the family, the patient remains confused and he is taking his Namenda daily.  A neurologist referral was placed in October, 2019 but an appointment has not been made.  He previously noncompliant with his medication but according to the family he is taking his medication daily.  The patient and his wife still have plans to move to [place] with their daughter who is looking for home.  He and his wife currently live with a son who is the primary caregiver.    Review of Systems   Constitutional: Negative for malaise/fatigue.   Respiratory: Negative for cough and sputum production.    Cardiovascular: Negative for chest pain and palpitations.   Gastrointestinal: Negative for nausea and vomiting.   Musculoskeletal: Negative for back pain.   Neurological: Negative for dizziness and headaches.       No Known Allergies    Current Outpatient Medications   Medication Sig Dispense Refill   ??? memantine (NAMENDA) 10 mg tablet Take 1 Tab by mouth two (2) times a day. 60 Tab 3   ??? C/sourcherry/celery/grape seed (TART CHERRY PO) Take  by mouth.     ??? finasteride (PROSCAR) 5 mg tablet Take 1 Tab by mouth daily. 90 Tab 1   ??? amLODIPine-benazepril (LOTREL) 10-40 mg per capsule Take 1 Cap by mouth daily. 90 Cap 1   ??? aspirin delayed-release 81 mg tablet Take 81 mg by mouth daily.     ??? peg 400-propylene glycol (SYSTANE, PROPYLENE GLYCOL,) 0.4-0.3 % drop as needed.     ??? multivitamin (ONE A DAY) tablet Take 1 Tab by mouth daily.         Past Medical History:   Diagnosis Date   ??? Constipation    ??? H/O seasonal allergies    ??? History of BPH    ??? Hypercholesterolemia    ??? Hypertension     ??? Memory loss        Past Surgical History:   Procedure Laterality Date   ??? HX HERNIA REPAIR     ??? HX KNEE REPLACEMENT Left    ??? HX ORTHOPAEDIC      bilateral shoulder replacement   ??? HX TUMOR REMOVAL Right     shoulder        Social History     Socioeconomic History   ??? Marital status: MARRIED     Spouse name: Not on file   ??? Number of children: Not on file   ??? Years of education: Not on file   ??? Highest education level: Not on file   Occupational History   ??? Not on file   Social Needs   ??? Financial resource strain: Not on file   ??? Food insecurity:     Worry: Not on file     Inability: Not on file   ??? Transportation needs:     Medical: Not on file     Non-medical: Not on file   Tobacco Use   ??? Smoking status: Former Smoker   ??? Smokeless tobacco: Former Estate agentUser   Substance and Sexual Activity   ??? Alcohol use: No   ??? Drug  use: No   ??? Sexual activity: Not Currently     Partners: Female   Lifestyle   ??? Physical activity:     Days per week: Not on file     Minutes per session: Not on file   ??? Stress: Not on file   Relationships   ??? Social connections:     Talks on phone: Not on file     Gets together: Not on file     Attends religious service: Not on file     Active member of club or organization: Not on file     Attends meetings of clubs or organizations: Not on file     Relationship status: Not on file   ??? Intimate partner violence:     Fear of current or ex partner: Not on file     Emotionally abused: Not on file     Physically abused: Not on file     Forced sexual activity: Not on file   Other Topics Concern   ??? Not on file   Social History Narrative   ??? Not on file       Patient does not have an advanced directive on file    Vitals:    11/11/18 0956 11/11/18 1036   BP: (!) 148/94 124/80   Pulse: 75    Resp: 12    Temp: 96.6 ??F (35.9 ??C)    TempSrc: Oral    SpO2: 97%    Weight: 181 lb (82.1 kg)    Height: 6\' 2"  (1.88 m)    PainSc:   0 - No pain        Physical Exam  Vitals signs and nursing note reviewed.    Constitutional:       Appearance: Normal appearance.   Neck:      Musculoskeletal: Normal range of motion and neck supple.   Cardiovascular:      Pulses: Normal pulses.      Heart sounds: Normal heart sounds.   Pulmonary:      Effort: Pulmonary effort is normal.      Breath sounds: Normal breath sounds.   Skin:     General: Skin is warm and dry.   Neurological:      Mental Status: He is alert.         No visits with results within 3 Month(s) from this visit.   Latest known visit with results is:   Hospital Outpatient Visit on 07/01/2018   Component Date Value Ref Range Status   ??? Prostate Specific Ag 07/01/2018 22.8* 0.0 - 4.0 ng/mL Final    Comment: (NOTE)  Roche ECLIA methodology.  According to the American Urological Association, Serum PSA should  decrease and remain at undetectable levels after radical  prostatectomy. The AUA defines biochemical recurrence as an initial  PSA value 0.2 ng/mL or greater followed by a subsequent  confirmatory PSA value 0.2 ng/mL or greater.  Values obtained with different assay methods or kits cannot be used  interchangeably. Results cannot be interpreted as absolute evidence  of the presence or absence of malignant disease.     ??? PSA, Free 07/01/2018 3.13  N/A ng/mL Final    Comment: (NOTE)  Roche ECLIA methodology.     ??? PSA, % Free 07/01/2018 13.7  % Final    Comment: (NOTE)  The table below lists the probability of prostate cancer for  men with non-suspicious DRE results and total PSA between  4 and 10 ng/mL, by patient  age Damaris Schooner, JAMA 1998,  [id number]).                   % Free PSA       50-64 yr        65-75 yr                   0.00-10.00%        56%             55%                  10.01-15.00%        24%             35%                  15.01-20.00%        17%             23%                  20.01-25.00%        10%             20%                       >25.00%         5%              9%  Please note:  Catalona et al did not make specific                recommendations regarding the use of               percent free PSA for any other population               of men.  Performed At: [facility]  [address] [place], [place] [id number]  Jolene Schimke MD [id number]         .No results found for any visits on 11/11/18.    Assessment / Plan:      ICD-10-CM ICD-9-CM    1. Benign prostatic hyperplasia, unspecified whether lower urinary tract symptoms present N40.0 600.00    2. Hyperlipidemia, unspecified hyperlipidemia type E78.5 272.4    3. Memory loss R41.3 780.93          No change to current treatment plan    Follow-up and Dispositions    ?? Return in about 6 months (around 05/13/2019).         I asked the patient if he  had any questions and answered his  questions.  The patient stated that he understands the treatment plan and agrees with the treatment plan    This document was created with a voice activated dictation system and may contain transcription errors.

## 2018-11-11 NOTE — Progress Notes (Signed)
Visit Vitals  BP (!) 148/94   Pulse 75   Temp 96.6 ??F (35.9 ??C) (Oral)   Resp 12   Ht 6' 2" (1.88 m)   Wt 181 lb (82.1 kg)   SpO2 97%   BMI 23.24 kg/m??     Kyle Williams presents today for   Chief Complaint   Patient presents with   ??? Hypertension     follow-up   ??? Labs     patient did not get his labs done       Is someone accompanying this pt? no    Is the patient using any DME equipment during OV? no    Depression Screening:  3 most recent PHQ Screens 11/11/2018   Little interest or pleasure in doing things Not at all   Feeling down, depressed, irritable, or hopeless Not at all   Total Score PHQ 2 0       Learning Assessment:  Learning Assessment 05/06/2017   PRIMARY LEARNER Patient   HIGHEST LEVEL OF EDUCATION - PRIMARY LEARNER  SOME COLLEGE   BARRIERS PRIMARY LEARNER NONE   CO-LEARNER CAREGIVER No   PRIMARY LANGUAGE ENGLISH   LEARNER PREFERENCE PRIMARY READING   ANSWERED BY patient   RELATIONSHIP SELF       Abuse Screening:  Abuse Screening Questionnaire 05/06/2017   Do you ever feel afraid of your partner? N   Are you in a relationship with someone who physically or mentally threatens you? N   Is it safe for you to go home? Y       Fall Risk  Fall Risk Assessment, last 12 mths 11/11/2018   Able to walk? Yes   Fall in past 12 months? No       Health Maintenance reviewed and discussed and ordered per Provider.    Health Maintenance Due   Topic Date Due   ??? DTaP/Tdap/Td series (1 - Tdap) 07[birth year]/1946   ??? Shingrix Vaccine Age 50> (1 of 2) 07/15/1985   ??? GLAUCOMA SCREENING Q2Y  07/15/2000   ??? Pneumococcal 65+ years (1 of 1 - PPSV23) 07/15/2000           Coordination of Care:  1. Have you been to the ER, urgent care clinic since your last visit? Hospitalized since your last visit? no    2. Have you seen or consulted any other health care providers outside of the [facility] since your last visit? Include any pap smears or colon screening. no

## 2018-11-11 NOTE — Progress Notes (Signed)
Kyle Williams is a 84 y.o. male presenting today for Hypertension (follow-up) and Labs (patient did not get his labs done)    HPI:  Kyle Williams presents to the office today for hypertension follow-up care. He present today feeling well. His blood pressure is controlled at 124/80 and is negative for chest pain, palpation or lower extremity edema.  Per the family, the patient remains confused and he is taking his Namenda daily.  A neurologist referral was placed in October, 2019 but an appointment has not been made.  He previously noncompliant with his medication but according to the family he is taking his medication daily.  The patient and his wife still have plans to move to [place] with their daughter who is looking for home.  He and his wife currently live with a son who is the primary caregiver.    Review of Systems   Constitutional: Negative for malaise/fatigue.   Respiratory: Negative for cough and sputum production.    Cardiovascular: Negative for chest pain and palpitations.   Gastrointestinal: Negative for nausea and vomiting.   Musculoskeletal: Negative for back pain.   Neurological: Negative for dizziness and headaches.       No Known Allergies    Current Outpatient Medications   Medication Sig Dispense Refill   ??? memantine (NAMENDA) 10 mg tablet Take 1 Tab by mouth two (2) times a day. 60 Tab 3   ??? C/sourcherry/celery/grape seed (TART CHERRY PO) Take  by mouth.     ??? finasteride (PROSCAR) 5 mg tablet Take 1 Tab by mouth daily. 90 Tab 1   ??? amLODIPine-benazepril (LOTREL) 10-40 mg per capsule Take 1 Cap by mouth daily. 90 Cap 1   ??? aspirin delayed-release 81 mg tablet Take 81 mg by mouth daily.     ??? peg 400-propylene glycol (SYSTANE, PROPYLENE GLYCOL,) 0.4-0.3 % drop as needed.     ??? multivitamin (ONE A DAY) tablet Take 1 Tab by mouth daily.         Past Medical History:   Diagnosis Date   ??? Constipation    ??? H/O seasonal allergies    ??? History of BPH    ??? Hypercholesterolemia     ??? Hypertension    ??? Memory loss        Past Surgical History:   Procedure Laterality Date   ??? HX HERNIA REPAIR     ??? HX KNEE REPLACEMENT Left    ??? HX ORTHOPAEDIC      bilateral shoulder replacement   ??? HX TUMOR REMOVAL Right     shoulder        Social History     Socioeconomic History   ??? Marital status: MARRIED     Spouse name: Not on file   ??? Number of children: Not on file   ??? Years of education: Not on file   ??? Highest education level: Not on file   Occupational History   ??? Not on file   Social Needs   ??? Financial resource strain: Not on file   ??? Food insecurity:     Worry: Not on file     Inability: Not on file   ??? Transportation needs:     Medical: Not on file     Non-medical: Not on file   Tobacco Use   ??? Smoking status: Former Smoker   ??? Smokeless tobacco: Former Estate agent and Sexual Activity   ??? Alcohol use: No   ??? Drug  use: No   ??? Sexual activity: Not Currently     Partners: Female   Lifestyle   ??? Physical activity:     Days per week: Not on file     Minutes per session: Not on file   ??? Stress: Not on file   Relationships   ??? Social connections:     Talks on phone: Not on file     Gets together: Not on file     Attends religious service: Not on file     Active member of club or organization: Not on file     Attends meetings of clubs or organizations: Not on file     Relationship status: Not on file   ??? Intimate partner violence:     Fear of current or ex partner: Not on file     Emotionally abused: Not on file     Physically abused: Not on file     Forced sexual activity: Not on file   Other Topics Concern   ??? Not on file   Social History Narrative   ??? Not on file       Patient does not have an advanced directive on file    Vitals:    11/11/18 0956 11/11/18 1036   BP: (!) 148/94 124/80   Pulse: 75    Resp: 12    Temp: 96.6 ??F (35.9 ??C)    TempSrc: Oral    SpO2: 97%    Weight: 181 lb (82.1 kg)    Height: 6\' 2"  (1.88 m)    PainSc:   0 - No pain        Physical Exam   Vitals signs and nursing note reviewed.   Constitutional:       Appearance: Normal appearance.   Neck:      Musculoskeletal: Normal range of motion and neck supple.   Cardiovascular:      Pulses: Normal pulses.      Heart sounds: Normal heart sounds.   Pulmonary:      Effort: Pulmonary effort is normal.      Breath sounds: Normal breath sounds.   Skin:     General: Skin is warm and dry.   Neurological:      Mental Status: He is alert.         No visits with results within 3 Month(s) from this visit.   Latest known visit with results is:   Hospital Outpatient Visit on 07/01/2018   Component Date Value Ref Range Status   ??? Prostate Specific Ag 07/01/2018 22.8* 0.0 - 4.0 ng/mL Final    Comment: (NOTE)  Roche ECLIA methodology.  According to the American Urological Association, Serum PSA should  decrease and remain at undetectable levels after radical  prostatectomy. The AUA defines biochemical recurrence as an initial  PSA value 0.2 ng/mL or greater followed by a subsequent  confirmatory PSA value 0.2 ng/mL or greater.  Values obtained with different assay methods or kits cannot be used  interchangeably. Results cannot be interpreted as absolute evidence  of the presence or absence of malignant disease.     ??? PSA, Free 07/01/2018 3.13  N/A ng/mL Final    Comment: (NOTE)  Roche ECLIA methodology.     ??? PSA, % Free 07/01/2018 13.7  % Final    Comment: (NOTE)  The table below lists the probability of prostate cancer for  men with non-suspicious DRE results and total PSA between  4 and 10 ng/mL, by patient  age Damaris Schooner, JAMA 1998,  [id number]).                   % Free PSA       50-64 yr        65-75 yr                   0.00-10.00%        56%             55%                  10.01-15.00%        24%             35%                  15.01-20.00%        17%             23%                  20.01-25.00%        10%             20%                       >25.00%         5%              9%   Please note:  Catalona et al did not make specific               recommendations regarding the use of               percent free PSA for any other population               of men.  Performed At: [facility]  [address] [place], [place] [id number]  Jolene Schimke MD JY:[id number]         .No results found for any visits on 11/11/18.    Assessment / Plan:      ICD-10-CM ICD-9-CM    1. Benign prostatic hyperplasia, unspecified whether lower urinary tract symptoms present N40.0 600.00    2. Hyperlipidemia, unspecified hyperlipidemia type E78.5 272.4    3. Memory loss R41.3 780.93          No change to current treatment plan    Follow-up and Dispositions    ?? Return in about 6 months (around 05/13/2019).         I asked the patient if he  had any questions and answered his  questions.  The patient stated that he understands the treatment plan and agrees with the treatment plan    This document was created with a voice activated dictation system and may contain transcription errors.

## 2019-03-05 ENCOUNTER — Encounter

## 2019-03-05 NOTE — Telephone Encounter (Signed)
Last seen 11/12/19  Next appt  03/17/19    Requested Prescriptions     Pending Prescriptions Disp Refills   ??? memantine (NAMENDA) 10 mg tablet 60 Tab 3     Sig: Take 1 Tab by mouth two (2) times a day.

## 2019-03-11 ENCOUNTER — Encounter

## 2019-03-11 MED ORDER — MEMANTINE 10 MG TAB
10 mg | ORAL_TABLET | Freq: Two times a day (BID) | ORAL | 3 refills | Status: DC
Start: 2019-03-11 — End: 2019-03-31

## 2019-03-11 NOTE — Telephone Encounter (Signed)
A user error has taken place: encounter opened in error, closed for administrative reasons.

## 2019-03-12 ENCOUNTER — Inpatient Hospital Stay: Admit: 2019-03-12 | Payer: MEDICARE | Primary: Family

## 2019-03-12 DIAGNOSIS — I10 Essential (primary) hypertension: Secondary | ICD-10-CM

## 2019-03-12 LAB — COMPREHENSIVE METABOLIC PANEL
ALT: 16 U/L (ref 16–61)
AST: 20 U/L (ref 10–38)
Albumin/Globulin Ratio: 1 (ref 0.8–1.7)
Albumin: 4.1 g/dL (ref 3.4–5.0)
Alkaline Phosphatase: 89 U/L (ref 45–117)
Anion Gap: 5 mmol/L (ref 3.0–18)
BUN: 20 MG/DL — ABNORMAL HIGH (ref 7.0–18)
Bun/Cre Ratio: 13 (ref 12–20)
CO2: 29 mmol/L (ref 21–32)
Calcium: 9.4 MG/DL (ref 8.5–10.1)
Chloride: 111 mmol/L (ref 100–111)
Creatinine: 1.51 MG/DL — ABNORMAL HIGH (ref 0.6–1.3)
EGFR IF NonAfrican American: 44 mL/min/{1.73_m2} — ABNORMAL LOW (ref >60)
GFR African American: 54 mL/min/{1.73_m2} — ABNORMAL LOW (ref >60)
Globulin: 4.2 g/dL — ABNORMAL HIGH (ref 2.0–4.0)
Glucose: 97 mg/dL (ref 74–99)
Potassium: 4.3 mmol/L (ref 3.5–5.5)
Sodium: 145 mmol/L (ref 136–145)
Total Bilirubin: 0.6 MG/DL (ref 0.2–1.0)
Total Protein: 8.3 g/dL — ABNORMAL HIGH (ref 6.4–8.2)

## 2019-03-12 LAB — LIPID PANEL
CHOL/HDL Ratio: 4.5 (ref 0–5.0)
Chol/HDL Ratio: 4.5 (ref 0–5.0)
Cholesterol, Total: 272 MG/DL — ABNORMAL HIGH (ref <200)
Cholesterol, total: 272 MG/DL — ABNORMAL HIGH (ref <200)
HDL Cholesterol: 60 MG/DL (ref 40–60)
HDL: 60 MG/DL (ref 40–60)
LDL Calculated: 185.6 MG/DL — ABNORMAL HIGH (ref 0–100)
LDL, calculated: 185.6 MG/DL — ABNORMAL HIGH (ref 0–100)
Triglyceride: 132 MG/DL (ref <150)
Triglycerides: 132 MG/DL (ref <150)
VLDL Cholesterol Calculated: 26.4 MG/DL
VLDL, calculated: 26.4 MG/DL

## 2019-03-12 LAB — CBC WITH AUTO DIFFERENTIAL
Basophils %: 0 % (ref 0–2)
Basophils Absolute: 0 10*3/uL (ref 0.0–0.1)
Eosinophils %: 2 % (ref 0–5)
Eosinophils Absolute: 0.1 10*3/uL (ref 0.0–0.4)
Hematocrit: 42.2 % (ref 36.0–48.0)
Hemoglobin: 14.3 g/dL (ref 13.0–16.0)
Lymphocytes %: 30 % (ref 21–52)
Lymphocytes Absolute: 1.8 10*3/uL (ref 0.9–3.6)
MCH: 29.9 PG (ref 24.0–34.0)
MCHC: 33.9 g/dL (ref 31.0–37.0)
MCV: 88.1 FL (ref 74.0–97.0)
MPV: 10.9 FL (ref 9.2–11.8)
Monocytes %: 8 % (ref 3–10)
Monocytes Absolute: 0.5 10*3/uL (ref 0.05–1.2)
Neutrophils %: 60 % (ref 40–73)
Neutrophils Absolute: 3.8 10*3/uL (ref 1.8–8.0)
Platelets: 155 10*3/uL (ref 135–420)
RBC: 4.79 M/uL (ref 4.70–5.50)
RDW: 14.5 % (ref 11.6–14.5)
WBC: 6.2 10*3/uL (ref 4.6–13.2)

## 2019-03-12 LAB — METABOLIC PANEL, COMPREHENSIVE
A-G Ratio: 1 (ref 0.8–1.7)
ALT (SGPT): 16 U/L (ref 16–61)
AST (SGOT): 20 U/L (ref 10–38)
Albumin: 4.1 g/dL (ref 3.4–5.0)
Alk. phosphatase: 89 U/L (ref 45–117)
Anion gap: 5 mmol/L (ref 3.0–18)
BUN/Creatinine ratio: 13 (ref 12–20)
BUN: 20 MG/DL — ABNORMAL HIGH (ref 7.0–18)
Bilirubin, total: 0.6 MG/DL (ref 0.2–1.0)
CO2: 29 mmol/L (ref 21–32)
Calcium: 9.4 MG/DL (ref 8.5–10.1)
Chloride: 111 mmol/L (ref 100–111)
Creatinine: 1.51 MG/DL — ABNORMAL HIGH (ref 0.6–1.3)
GFR est AA: 54 mL/min/{1.73_m2} — ABNORMAL LOW (ref >60)
GFR est non-AA: 44 mL/min/{1.73_m2} — ABNORMAL LOW (ref >60)
Globulin: 4.2 g/dL — ABNORMAL HIGH (ref 2.0–4.0)
Glucose: 97 mg/dL (ref 74–99)
Potassium: 4.3 mmol/L (ref 3.5–5.5)
Protein, total: 8.3 g/dL — ABNORMAL HIGH (ref 6.4–8.2)
Sodium: 145 mmol/L (ref 136–145)

## 2019-03-12 LAB — CBC WITH AUTOMATED DIFF
ABS. BASOPHILS: 0 10*3/uL (ref 0.0–0.1)
ABS. EOSINOPHILS: 0.1 10*3/uL (ref 0.0–0.4)
ABS. LYMPHOCYTES: 1.8 10*3/uL (ref 0.9–3.6)
ABS. MONOCYTES: 0.5 10*3/uL (ref 0.05–1.2)
ABS. NEUTROPHILS: 3.8 10*3/uL (ref 1.8–8.0)
BASOPHILS: 0 % (ref 0–2)
EOSINOPHILS: 2 % (ref 0–5)
HCT: 42.2 % (ref 36.0–48.0)
HGB: 14.3 g/dL (ref 13.0–16.0)
LYMPHOCYTES: 30 % (ref 21–52)
MCH: 29.9 PG (ref 24.0–34.0)
MCHC: 33.9 g/dL (ref 31.0–37.0)
MCV: 88.1 FL (ref 74.0–97.0)
MONOCYTES: 8 % (ref 3–10)
MPV: 10.9 FL (ref 9.2–11.8)
NEUTROPHILS: 60 % (ref 40–73)
PLATELET: 155 10*3/uL (ref 135–420)
RBC: 4.79 M/uL (ref 4.70–5.50)
RDW: 14.5 % (ref 11.6–14.5)
WBC: 6.2 10*3/uL (ref 4.6–13.2)

## 2019-03-17 ENCOUNTER — Telehealth: Attending: Family | Primary: Family

## 2019-03-17 ENCOUNTER — Encounter: Attending: Family | Primary: Family

## 2019-03-17 ENCOUNTER — Telehealth: Admit: 2019-03-17 | Payer: MEDICARE | Attending: Family | Primary: Family

## 2019-03-17 DIAGNOSIS — I10 Essential (primary) hypertension: Secondary | ICD-10-CM

## 2019-03-17 MED ORDER — AMLODIPINE-BENAZEPRIL 10 MG-40 MG CAP
10-40 mg | ORAL_CAPSULE | Freq: Every day | ORAL | 1 refills | Status: DC
Start: 2019-03-17 — End: 2019-03-31

## 2019-03-17 MED ORDER — FINASTERIDE 5 MG TAB
5 mg | ORAL_TABLET | Freq: Every day | ORAL | 1 refills | Status: DC
Start: 2019-03-17 — End: 2019-03-31

## 2019-03-17 NOTE — Progress Notes (Signed)
Consent: Kyle Williams, who was seen by synchronous (real-time) audio-video technology, and/or his healthcare decision maker, is aware that this patient-initiated, Telehealth encounter on 03/17/2019 is a billable service, with coverage as determined by his insurance carrier. He is aware that he may receive a bill and has provided verbal consent to proceed: Yes.        Assessment & Plan:   Diagnoses and all orders for this visit:    1. Essential hypertension  -     amLODIPine-benazepril (LOTREL) 10-40 mg per capsule; Take 1 Cap by mouth daily.    2. Benign prostatic hyperplasia with lower urinary tract symptoms, symptom details unspecified  -     finasteride (Proscar) 5 mg tablet; Take 1 Tab by mouth daily.    3. Mixed hyperlipidemia  -     LIPID PANEL; Future    4. Memory loss    Memory loss-continue Namenda daily  Hypertension-refill Lotrel prescription  History of BPH-refill finasteride  Hyperlipidemia-uncontrolled.  Son/caregiver would like to work on diet changes.  Will repeat lab in 3 months  Follow-up and Dispositions    ?? Return in about 4 months (around 07/17/2019).     I spent at least 15 minutes with this established patient, and >50% of the time was spent counseling and/or coordinating care regarding medical evaluation, treatment plan and follow-up care  712  Subjective:   Kyle Williams is a 85 y.o. male who was seen for Follow-up and Hypertension    The patient presents for an Audio-visual teleconference appointment for hypertension follow-up care.  Still has a medical history for memory loss.  Is currently taking Namenda 10 mg tablet.  Per the family he has been compliant with taking his medication daily.  Per his son/caregiver his memory loss has not worsened.  He is alert and oriented x2.  Hypertension-his blood pressure is elevated today but he has not had his hypertension medication this morning.  He denies any chest pain or palpitation.  Hyperlipidemia-fasting labs done prior to today's  visit.  His cholesterol is 272 and LDL is 184.  His previous numbers were cholesterol 160 and LDL was 90.  Notes his diet has been consistent.    Prior to Admission medications    Medication Sig Start Date End Date Taking? Authorizing Provider   amLODIPine-benazepril (LOTREL) 10-40 mg per capsule Take 1 Cap by mouth daily. 03/17/19  Yes Paula Libra, NP   finasteride (Proscar) 5 mg tablet Take 1 Tab by mouth daily. 03/17/19  Yes Paula Libra, NP   memantine (NAMENDA) 10 mg tablet Take 1 Tab by mouth two (2) times a day. 03/11/19   Paula Libra, NP   C/sourcherry/celery/grape seed (TART CHERRY PO) Take  by mouth.    Provider, Historical   finasteride (PROSCAR) 5 mg tablet Take 1 Tab by mouth daily. 11/05/17 03/17/19  Paula Libra, NP   amLODIPine-benazepril (LOTREL) 10-40 mg per capsule Take 1 Cap by mouth daily. 11/05/17 03/17/19  Paula Libra, NP   aspirin delayed-release 81 mg tablet Take 81 mg by mouth daily.    Provider, Historical   peg 400-propylene glycol (SYSTANE, PROPYLENE GLYCOL,) 0.4-0.3 % drop as needed.    Provider, Historical   multivitamin (ONE A DAY) tablet Take 1 Tab by mouth daily.    Provider, Historical     No Known Allergies    Patient Active Problem List   Diagnosis Code   ??? Essential hypertension I10   ??? Hyperlipidemia  E78.5   ??? Memory loss R41.3   ??? Benign prostatic hyperplasia N40.0   ??? Seasonal allergic rhinitis J30.2   ??? Constipation K59.00     Patient Active Problem List    Diagnosis Date Noted   ??? Essential hypertension 05/06/2017   ??? Hyperlipidemia 05/06/2017   ??? Memory loss 05/06/2017   ??? Benign prostatic hyperplasia 05/06/2017   ??? Seasonal allergic rhinitis 05/06/2017   ??? Constipation 05/06/2017     Current Outpatient Medications   Medication Sig Dispense Refill   ??? amLODIPine-benazepril (LOTREL) 10-40 mg per capsule Take 1 Cap by mouth daily. 90 Cap 1   ??? finasteride (Proscar) 5 mg tablet Take 1 Tab by mouth daily. 90 Tab 1   ??? memantine  (NAMENDA) 10 mg tablet Take 1 Tab by mouth two (2) times a day. 60 Tab 3   ??? C/sourcherry/celery/grape seed (TART CHERRY PO) Take  by mouth.     ??? aspirin delayed-release 81 mg tablet Take 81 mg by mouth daily.     ??? peg 400-propylene glycol (SYSTANE, PROPYLENE GLYCOL,) 0.4-0.3 % drop as needed.     ??? multivitamin (ONE A DAY) tablet Take 1 Tab by mouth daily.       No Known Allergies  Past Medical History:   Diagnosis Date   ??? Constipation    ??? H/O seasonal allergies    ??? History of BPH    ??? Hypercholesterolemia    ??? Hypertension    ??? Memory loss        ROS    Constitutional: No apparent distress noted  General- negative for fever, chills or fatigue  Eyes- negative visual changes  CV- denies chest pain, palpitation  Pul: negative cough or SOB  GI: negative nausea, flank pain, diarrhea, constipation  Urinary:- No dysuria or polyuria  MS- negative myalgia, negative joint pain  Neuro- + memory loss  Skin- negative for rashes or lesions.        Objective:   Vital Signs: (As obtained by patient/caregiver at home)  Visit Vitals  BP (!) 155/100 Comment: without medication   Pulse 72        [INSTRUCTIONS:  "[x] " Indicates a positive item  "[] " Indicates a negative item  -- DELETE ALL ITEMS NOT EXAMINED]    Constitutional: [x]  Appears well-developed and well-nourished [x]  No apparent distress      []  Abnormal -     Mental status: [x]  Alert and awake  [x]  Oriented to person/place/time [x]  Able to follow commands    []  Abnormal -     Eyes:   EOM    [x]   Normal    []  Abnormal -   Sclera  [x]   Normal    []  Abnormal -          Discharge [x]   None visible   []  Abnormal -     HENT: [x]  Normocephalic, atraumatic  []  Abnormal -   [x]  Mouth/Throat: Mucous membranes are moist    External Ears [x]  Normal  []  Abnormal -    Neck: [x]  No visualized mass []  Abnormal -     Pulmonary/Chest: [x]  Respiratory effort normal   [x]  No visualized signs of difficulty breathing or respiratory distress        []  Abnormal -      Musculoskeletal:   [x]   Normal gait with no signs of ataxia         [x]  Normal range of motion of neck        []   Abnormal -     Neurological:        [x]  No Facial Asymmetry (Cranial nerve 7 motor function) (limited exam due to video visit)          [x]  No gaze palsy        []  Abnormal -          Skin:        [x]  No significant exanthematous lesions or discoloration noted on facial skin         []  Abnormal -            Psychiatric:       [x]  Normal Affect []  Abnormal -        [x]  No Hallucinations    Other pertinent observable physical exam findings:-        We discussed the expected course, resolution and complications of the diagnosis(es) in detail.  Medication risks, benefits, costs, interactions, and alternatives were discussed as indicated.  I advised him to contact the office if his condition worsens, changes or fails to improve as anticipated. He expressed understanding with the diagnosis(es) and plan.       Kyle Williams is a 85 y.o. male being evaluated by a video visit encounter for concerns as above.  A caregiver was present when appropriate. Due to this being a Scientist, research (medical) (During COVID-19 public health emergency), evaluation of the following organ systems was limited: Vitals/Constitutional/EENT/Resp/CV/GI/GU/MS/Neuro/Skin/Heme-Lymph-Imm.  Pursuant to the emergency declaration under the [facility] Act and the [employer], 1135 waiver authority and the [employer] and [employer] Act, this Virtual  Visit was conducted, with patient's (and/or legal guardian's) consent, to reduce the patient's risk of exposure to COVID-19 and provide necessary medical care.     Services were provided through a video synchronous discussion virtually to substitute for in-person clinic visit.   Patient and provider were located at their individual homes.        Paula Libra, NP

## 2019-03-17 NOTE — Progress Notes (Signed)
Consent: Kyle Williams, who was seen by synchronous (real-time) audio-video technology, and/or his healthcare decision maker, is aware that this patient-initiated, Telehealth encounter on 03/17/2019 is a billable service, with coverage as determined by his insurance carrier. He is aware that he may receive a bill and has provided verbal consent to proceed: Yes.        Assessment & Plan:   Diagnoses and all orders for this visit:    1. Essential hypertension  -     amLODIPine-benazepril (LOTREL) 10-40 mg per capsule; Take 1 Cap by mouth daily.    2. Benign prostatic hyperplasia with lower urinary tract symptoms, symptom details unspecified  -     finasteride (Proscar) 5 mg tablet; Take 1 Tab by mouth daily.    3. Mixed hyperlipidemia  -     LIPID PANEL; Future    4. Memory loss    Memory loss-continue Namenda daily  Hypertension-refill Lotrel prescription  History of BPH-refill finasteride  Hyperlipidemia-uncontrolled.  Son/caregiver would like to work on diet changes.  Will repeat lab in 3 months  Follow-up and Dispositions    ?? Return in about 4 months (around 07/17/2019).     I spent at least 15 minutes with this established patient, and >50% of the time was spent counseling and/or coordinating care regarding medical evaluation, treatment plan and follow-up care  712  Subjective:   Kyle Williams is a 85 y.o. male who was seen for Follow-up and Hypertension    The patient presents for an Audio-visual teleconference appointment for hypertension follow-up care.  Still has a medical history for memory loss.  Is currently taking Namenda 10 mg tablet.  Per the family he has been compliant with taking his medication daily.  Per his son/caregiver his memory loss has not worsened.  He is alert and oriented x2.  Hypertension-his blood pressure is elevated today but he has not had his hypertension medication this morning.  He denies any chest pain or palpitation.   Hyperlipidemia-fasting labs done prior to today's visit.  His cholesterol is 272 and LDL is 184.  His previous numbers were cholesterol 160 and LDL was 90.  Notes his diet has been consistent.    Prior to Admission medications    Medication Sig Start Date End Date Taking? Authorizing Provider   amLODIPine-benazepril (LOTREL) 10-40 mg per capsule Take 1 Cap by mouth daily. 03/17/19  Yes Paula Libra, NP   finasteride (Proscar) 5 mg tablet Take 1 Tab by mouth daily. 03/17/19  Yes Paula Libra, NP   memantine (NAMENDA) 10 mg tablet Take 1 Tab by mouth two (2) times a day. 03/11/19   Paula Libra, NP   C/sourcherry/celery/grape seed (TART CHERRY PO) Take  by mouth.    Provider, Historical   finasteride (PROSCAR) 5 mg tablet Take 1 Tab by mouth daily. 11/05/17 03/17/19  Paula Libra, NP   amLODIPine-benazepril (LOTREL) 10-40 mg per capsule Take 1 Cap by mouth daily. 11/05/17 03/17/19  Paula Libra, NP   aspirin delayed-release 81 mg tablet Take 81 mg by mouth daily.    Provider, Historical   peg 400-propylene glycol (SYSTANE, PROPYLENE GLYCOL,) 0.4-0.3 % drop as needed.    Provider, Historical   multivitamin (ONE A DAY) tablet Take 1 Tab by mouth daily.    Provider, Historical     No Known Allergies    Patient Active Problem List   Diagnosis Code   ??? Essential hypertension I10   ??? Hyperlipidemia  E78.5   ??? Memory loss R41.3   ??? Benign prostatic hyperplasia N40.0   ??? Seasonal allergic rhinitis J30.2   ??? Constipation K59.00     Patient Active Problem List    Diagnosis Date Noted   ??? Essential hypertension 05/06/2017   ??? Hyperlipidemia 05/06/2017   ??? Memory loss 05/06/2017   ??? Benign prostatic hyperplasia 05/06/2017   ??? Seasonal allergic rhinitis 05/06/2017   ??? Constipation 05/06/2017     Current Outpatient Medications   Medication Sig Dispense Refill   ??? amLODIPine-benazepril (LOTREL) 10-40 mg per capsule Take 1 Cap by mouth daily. 90 Cap 1    ??? finasteride (Proscar) 5 mg tablet Take 1 Tab by mouth daily. 90 Tab 1   ??? memantine (NAMENDA) 10 mg tablet Take 1 Tab by mouth two (2) times a day. 60 Tab 3   ??? C/sourcherry/celery/grape seed (TART CHERRY PO) Take  by mouth.     ??? aspirin delayed-release 81 mg tablet Take 81 mg by mouth daily.     ??? peg 400-propylene glycol (SYSTANE, PROPYLENE GLYCOL,) 0.4-0.3 % drop as needed.     ??? multivitamin (ONE A DAY) tablet Take 1 Tab by mouth daily.       No Known Allergies  Past Medical History:   Diagnosis Date   ??? Constipation    ??? H/O seasonal allergies    ??? History of BPH    ??? Hypercholesterolemia    ??? Hypertension    ??? Memory loss        ROS    Constitutional: No apparent distress noted  General- negative for fever, chills or fatigue  Eyes- negative visual changes  CV- denies chest pain, palpitation  Pul: negative cough or SOB  GI: negative nausea, flank pain, diarrhea, constipation  Urinary:- No dysuria or polyuria  MS- negative myalgia, negative joint pain  Neuro- + memory loss  Skin- negative for rashes or lesions.        Objective:   Vital Signs: (As obtained by patient/caregiver at home)  Visit Vitals  BP (!) 155/100 Comment: without medication   Pulse 72        [INSTRUCTIONS:  "[x] " Indicates a positive item  "[] " Indicates a negative item  -- DELETE ALL ITEMS NOT EXAMINED]    Constitutional: [x]  Appears well-developed and well-nourished [x]  No apparent distress      []  Abnormal -     Mental status: [x]  Alert and awake  [x]  Oriented to person/place/time [x]  Able to follow commands    []  Abnormal -     Eyes:   EOM    [x]   Normal    []  Abnormal -   Sclera  [x]   Normal    []  Abnormal -          Discharge [x]   None visible   []  Abnormal -     HENT: [x]  Normocephalic, atraumatic  []  Abnormal -   [x]  Mouth/Throat: Mucous membranes are moist    External Ears [x]  Normal  []  Abnormal -    Neck: [x]  No visualized mass []  Abnormal -     Pulmonary/Chest: [x]  Respiratory effort normal   [x]  No visualized signs  of difficulty breathing or respiratory distress        []  Abnormal -      Musculoskeletal:   [x]  Normal gait with no signs of ataxia         [x]  Normal range of motion of neck        []   Abnormal -     Neurological:        [x]  No Facial Asymmetry (Cranial nerve 7 motor function) (limited exam due to video visit)          [x]  No gaze palsy        []  Abnormal -          Skin:        [x]  No significant exanthematous lesions or discoloration noted on facial skin         []  Abnormal -            Psychiatric:       [x]  Normal Affect []  Abnormal -        [x]  No Hallucinations    Other pertinent observable physical exam findings:-        We discussed the expected course, resolution and complications of the diagnosis(es) in detail.  Medication risks, benefits, costs, interactions, and alternatives were discussed as indicated.  I advised him to contact the office if his condition worsens, changes or fails to improve as anticipated. He expressed understanding with the diagnosis(es) and plan.       Kyle Williams is a 85 y.o. male being evaluated by a video visit encounter for concerns as above.  A caregiver was present when appropriate. Due to this being a Scientist, research (medical) (During COVID-19 public health emergency), evaluation of the following organ systems was limited: Vitals/Constitutional/EENT/Resp/CV/GI/GU/MS/Neuro/Skin/Heme-Lymph-Imm.  Pursuant to the emergency declaration under the [facility] Act and the [employer], 1135 waiver authority and the [employer] Act, this Virtual  Visit was conducted, with patient's (and/or legal guardian's) consent, to reduce the patient's risk of exposure to COVID-19 and provide necessary medical care.     Services were provided through a video synchronous discussion virtually to substitute for in-person clinic visit.   Patient and provider were located at their individual homes.        Paula Libra, NP

## 2019-03-31 ENCOUNTER — Encounter

## 2019-03-31 MED ORDER — FINASTERIDE 5 MG TAB
5 mg | ORAL_TABLET | Freq: Every day | ORAL | 1 refills | Status: DC
Start: 2019-03-31 — End: 2019-10-28

## 2019-03-31 MED ORDER — MEMANTINE 10 MG TAB
10 mg | ORAL_TABLET | Freq: Two times a day (BID) | ORAL | 3 refills | Status: DC
Start: 2019-03-31 — End: 2019-07-27

## 2019-03-31 MED ORDER — AMLODIPINE-BENAZEPRIL 10 MG-40 MG CAP
10-40 mg | ORAL_CAPSULE | Freq: Every day | ORAL | 1 refills | Status: DC
Start: 2019-03-31 — End: 2019-10-28

## 2019-07-23 ENCOUNTER — Encounter

## 2019-07-27 MED ORDER — MEMANTINE 10 MG TAB
10 mg | ORAL_TABLET | ORAL | 3 refills | Status: DC
Start: 2019-07-27 — End: 2019-12-10

## 2019-10-28 ENCOUNTER — Encounter

## 2019-10-28 MED ORDER — AMLODIPINE-BENAZEPRIL 10 MG-40 MG CAP
10-40 mg | ORAL_CAPSULE | Freq: Every day | ORAL | 1 refills | Status: DC
Start: 2019-10-28 — End: 2020-05-23

## 2019-10-28 MED ORDER — FINASTERIDE 5 MG TAB
5 mg | ORAL_TABLET | Freq: Every day | ORAL | 1 refills | Status: DC
Start: 2019-10-28 — End: 2020-05-23

## 2019-10-28 NOTE — Telephone Encounter (Signed)
Last seen 03/17/19  Next appt  11/05/19    Requested Prescriptions     Pending Prescriptions Disp Refills   ??? amLODIPine-benazepril (LOTREL) 10-40 mg per capsule 90 Cap 1     Sig: Take 1 Cap by mouth daily.   ??? finasteride (Proscar) 5 mg tablet 90 Tab 1     Sig: Take 1 Tab by mouth daily.

## 2019-11-05 ENCOUNTER — Telehealth: Attending: Family | Primary: Family

## 2019-11-05 ENCOUNTER — Telehealth: Admit: 2019-11-05 | Discharge: 2019-11-05 | Payer: MEDICARE | Attending: Family | Primary: Family

## 2019-11-05 DIAGNOSIS — Z0000 Encounter for general adult medical examination without abnormal findings: Secondary | ICD-10-CM

## 2019-11-05 NOTE — Progress Notes (Signed)
 Kyle Williams presents today for   Chief Complaint   Patient presents with   . Annual Wellness Visit   . Hypertension     follow-up       Virtual/telephone visit    Depression Screening:  3 most recent PHQ Screens 11/05/2019   Little interest or pleasure in doing things Not at all   Feeling down, depressed, irritable, or hopeless Not at all   Total Score PHQ 2 0       Learning Assessment:  Learning Assessment 05/06/2017   PRIMARY LEARNER Patient   HIGHEST LEVEL OF EDUCATION - PRIMARY LEARNER  SOME COLLEGE   BARRIERS PRIMARY LEARNER NONE   CO-LEARNER CAREGIVER No   PRIMARY LANGUAGE ENGLISH   LEARNER PREFERENCE PRIMARY READING   ANSWERED BY patient   RELATIONSHIP SELF       Abuse Screening:  Abuse Screening Questionnaire 11/05/2019   Do you ever feel afraid of your partner? N   Are you in a relationship with someone who physically or mentally threatens you? N   Is it safe for you to go home? Y       Fall Risk  Fall Risk Assessment, last 12 mths 11/05/2019   Able to walk? Yes   Fall in past 12 months? No         Health Maintenance reviewed and discussed and ordered per Provider.    Health Maintenance Due   Topic Date Due   . DTaP/Tdap/Td series (1 - Tdap) 07/15/1956   . Shingrix Vaccine Age 46> (1 of 2) 07/15/1985   . GLAUCOMA SCREENING Q2Y  07/15/2000   . Pneumococcal 65+ years (1 of 1 - PPSV23) 07/15/2000   . Flu Vaccine (1) 07/28/2019   . Medicare Yearly Exam  09/03/2019   .      Coordination of Care:  1. Have you been to the ER, urgent care clinic since your last visit? Hospitalized since your last visit? no    2. Have you seen or consulted any other health care providers outside of the [facility] since your last visit? Include any pap smears or colon screening. no

## 2019-11-05 NOTE — Progress Notes (Signed)
This is the Subsequent Medicare Annual Wellness Exam, performed 12 months or more after the Initial AWV or the last Subsequent AWV    I have reviewed the patient's medical history in detail and updated the computerized patient record.     Depression Risk Factor Screening:     3 most recent PHQ Screens 11/05/2019   Little interest or pleasure in doing things Not at all   Feeling down, depressed, irritable, or hopeless Not at all   Total Score PHQ 2 0       Alcohol Risk Screen   Do you average more than 1 drink per night or more than 7 drinks a week: No    In the past three months have you have had more than 4 drinks containing alcohol on one occasion: No      Functional Ability and Level of Safety:   Hearing: The patient needs further evaluation.     Activities of Daily Living:  The home contains: grab bars  Patient does total self care     Ambulation: with no difficulty     Fall Risk:  Fall Risk Assessment, last 12 mths 11/05/2019   Able to walk? Yes   Fall in past 12 months? No     Abuse Screen:  Patient is not abused       Cognitive Screening   Has your family/caregiver stated any concerns about your memory: Yes    Cognitive Screening: Normal - Patient has a history of dementia    Assessment/Plan   Education and counseling provided:  Are appropriate based on today's review and evaluation    Diagnoses and all orders for this visit:    1. Essential hypertension  -     CBC WITH AUTOMATED DIFF; Future  -     METABOLIC PANEL, COMPREHENSIVE; Future    2. Memory loss    3. Mixed hyperlipidemia  -     LIPID PANEL; Future    4. Screening PSA (prostate specific antigen)  -     PSA SCREENING (SCREENING); Future    5. Dementia without behavioral disturbance, unspecified dementia type [facility])        Health Maintenance Due     Health Maintenance Due   Topic Date Due   ??? DTaP/Tdap/Td series (1 - Tdap) 07/15/1956   ??? Shingrix Vaccine Age 58> (1 of 2) 07/15/1985   ??? GLAUCOMA SCREENING Q2Y  07/15/2000   ??? Pneumococcal 65+ years (1 of  1 - PPSV23) 07/15/2000   ??? Flu Vaccine (1) 07/28/2019   ??? Medicare Yearly Exam  09/03/2019       Patient Care Team   Patient Care Team:  Paula Libra, NP as PCP - General (Nurse Practitioner)  Paula Libra, NP as PCP - [facility] Empaneled Provider  Cherrie Gauze, MD as Physician (Urology)    History     Patient Active Problem List   Diagnosis Code   ??? Essential hypertension I10   ??? Hyperlipidemia E78.5   ??? Memory loss R41.3   ??? Benign prostatic hyperplasia N40.0   ??? Seasonal allergic rhinitis J30.2   ??? Constipation K59.00   ??? Dementia without behavioral disturbance (HCC) F03.90     Past Medical History:   Diagnosis Date   ??? Constipation    ??? H/O seasonal allergies    ??? History of BPH    ??? Hypercholesterolemia    ??? Hypertension    ??? Memory loss       Past Surgical  History:   Procedure Laterality Date   ??? HX HERNIA REPAIR     ??? HX KNEE REPLACEMENT Left    ??? HX ORTHOPAEDIC      bilateral shoulder replacement   ??? HX TUMOR REMOVAL Right     shoulder      Current Outpatient Medications   Medication Sig Dispense Refill   ??? amLODIPine-benazepril (LOTREL) 10-40 mg per capsule Take 1 Cap by mouth daily. 90 Cap 1   ??? finasteride (Proscar) 5 mg tablet Take 1 Tab by mouth daily. 90 Tab 1   ??? peg 400-propylene glycol (SYSTANE, PROPYLENE GLYCOL,) 0.4-0.3 % drop as needed.     ??? multivitamin (ONE A DAY) tablet Take 1 Tab by mouth daily.     ??? memantine (NAMENDA) 10 mg tablet take 1 tablet by mouth twice a day 60 Tab 3   ??? C/sourcherry/celery/grape seed (TART CHERRY PO) Take  by mouth.     ??? aspirin delayed-release 81 mg tablet Take 81 mg by mouth daily.       No Known Allergies    Family History   Problem Relation Age of Onset   ??? Cancer Mother         ovarian   ??? Cancer Father         stomach   ??? Heart Disease Maternal Grandmother      Social History     Tobacco Use   ??? Smoking status: Former Smoker   ??? Smokeless tobacco: Former Chief Strategy Officer Use Topics   ??? Alcohol use: No       Kyle Williams, who was  evaluated through a synchronous (real-time) audio only encounter, and/or his healthcare decision maker, is aware that it is a billable service, with coverage as determined by his insurance carrier. He provided verbal consent to proceed: n/a- consent obtained within past 12 months, and patient identification was verified. It was conducted pursuant to the emergency declaration under the [place], [place] waiver authority and the [employer] and [employer] Act. A caregiver was present when appropriate. Ability to conduct physical exam was limited. I was in the office. The patient was at home.    Candie Echevaria, NP

## 2019-11-05 NOTE — Progress Notes (Signed)
Kyle BonesMillard C [name] is a 85 y.o. male who was seen by synchronous (real-time) audio-video technology on 11/05/2019 for Annual Wellness Visit and Hypertension (follow-up)    Assessment & Plan:   Diagnoses and all orders for this visit:    1. Essential hypertension  -     CBC WITH AUTOMATED DIFF; Future  -     METABOLIC PANEL, COMPREHENSIVE; Future    2. Memory loss    3. Mixed hyperlipidemia  -     LIPID PANEL; Future    4. Screening PSA (prostate specific antigen)  -     PSA SCREENING (SCREENING); Future    5. Dementia without behavioral disturbance, unspecified dementia type (HCC)      Fasting labs ordered  Family needs a letter noting patient's memory loss      Subjective:     The patient presents for an Audio-visual teleconference appointment for routine follow-up care.  Patient has a medical diagnosis for hypertension, hyperlipidemia and memory loss.  Patient is alert to person only.  Per his caregiver/son and wife his memory has progressively worsened since April, 2020.  Per the wife, her husband is like his 83-year-old.  They deny any behavioral disturbances but notes he does get frustrated at times.  The family is requesting a letter noting he has dementia so that his daughter can file taxes for 2019 at 2020.  Hypertension-patient is compliant with taking his medications daily.  He notes he negative for chest pain or palpitation.  He denies any cough, fever, or GI upset.    Prior to Admission medications    Medication Sig Start Date End Date Taking? Authorizing Provider   amLODIPine-benazepril (LOTREL) 10-40 mg per capsule Take 1 Cap by mouth daily. 10/28/19  Yes Paula LibraFields-Sykes, Kerrianne Jeng A, NP   finasteride (Proscar) 5 mg tablet Take 1 Tab by mouth daily. 10/28/19  Yes Paula LibraFields-Sykes, Louanna Vanliew A, NP   peg 400-propylene glycol (SYSTANE, PROPYLENE GLYCOL,) 0.4-0.3 % drop as needed.   Yes Provider, Historical   multivitamin (ONE A DAY) tablet Take 1 Tab by mouth daily.   Yes Provider, Historical   memantine (NAMENDA) 10 mg  tablet take 1 tablet by mouth twice a day 07/27/19   Paula LibraFields-Sykes, Kimanh Templeman A, NP   C/sourcherry/celery/grape seed (TART CHERRY PO) Take  by mouth.    Provider, Historical   aspirin delayed-release 81 mg tablet Take 81 mg by mouth daily.    Provider, Historical     Patient Active Problem List   Diagnosis Code   ??? Essential hypertension I10   ??? Hyperlipidemia E78.5   ??? Memory loss R41.3   ??? Benign prostatic hyperplasia N40.0   ??? Seasonal allergic rhinitis J30.2   ??? Constipation K59.00   ??? Dementia without behavioral disturbance (HCC) F03.90     Patient Active Problem List    Diagnosis Date Noted   ??? Dementia without behavioral disturbance (HCC) 11/05/2019   ??? Essential hypertension 05/06/2017   ??? Hyperlipidemia 05/06/2017   ??? Memory loss 05/06/2017   ??? Benign prostatic hyperplasia 05/06/2017   ??? Seasonal allergic rhinitis 05/06/2017   ??? Constipation 05/06/2017     Current Outpatient Medications   Medication Sig Dispense Refill   ??? amLODIPine-benazepril (LOTREL) 10-40 mg per capsule Take 1 Cap by mouth daily. 90 Cap 1   ??? finasteride (Proscar) 5 mg tablet Take 1 Tab by mouth daily. 90 Tab 1   ??? peg 400-propylene glycol (SYSTANE, PROPYLENE GLYCOL,) 0.4-0.3 % drop as needed.     ???  multivitamin (ONE A DAY) tablet Take 1 Tab by mouth daily.     ??? memantine (NAMENDA) 10 mg tablet take 1 tablet by mouth twice a day 60 Tab 3   ??? C/sourcherry/celery/grape seed (TART CHERRY PO) Take  by mouth.     ??? aspirin delayed-release 81 mg tablet Take 81 mg by mouth daily.       No Known Allergies  Past Medical History:   Diagnosis Date   ??? Constipation    ??? H/O seasonal allergies    ??? History of BPH    ??? Hypercholesterolemia    ??? Hypertension    ??? Memory loss        ROS    Constitutional: No apparent distress noted  General- negative for fever, chills or fatigue  Eyes- negative visual changes  CV- denies chest pain, palpitation  Pul: negative cough or SOB  GI: negative nausea, flank pain, diarrhea, constipation  Urinary:- No dysuria or  polyuria  MS- negative myalgia, negative joint pain  Neuro- negative headache, dizziness or weakness  Skin- negative for rashes or lesions.  Psych- dementia    Objective:     Patient-Reported Vitals 11/05/2019   Patient-Reported Weight 172 lb   Patient-Reported Pulse 67   Patient-Reported Temperature 97.7   Patient-Reported Systolic  161   Patient-Reported Diastolic 91        [INSTRUCTIONS:  "[x] " Indicates a positive item  "[] " Indicates a negative item  -- DELETE ALL ITEMS NOT EXAMINED]    Constitutional: [x]  Appears well-developed and well-nourished [x]  No apparent distress      []  Abnormal -     Mental status: [x]  Alert and awake  [x]  Oriented to person/place/time [x]  Able to follow commands    []  Abnormal -     Eyes:   EOM    [x]   Normal    []  Abnormal -   Sclera  [x]   Normal    []  Abnormal -          Discharge [x]   None visible   []  Abnormal -     HENT: [x]  Normocephalic, atraumatic  []  Abnormal -   [x]  Mouth/Throat: Mucous membranes are moist    External Ears [x]  Normal  []  Abnormal -    Neck: [x]  No visualized mass []  Abnormal -     Pulmonary/Chest: [x]  Respiratory effort normal   [x]  No visualized signs of difficulty breathing or respiratory distress        []  Abnormal -      Musculoskeletal:   [x]  Normal gait with no signs of ataxia         [x]  Normal range of motion of neck        []  Abnormal -     Neurological:        [x]  No Facial Asymmetry (Cranial nerve 7 motor function) (limited exam due to video visit)          [x]  No gaze palsy        []  Abnormal -          Skin:        [x]  No significant exanthematous lesions or discoloration noted on facial skin         []  Abnormal -            Psychiatric:       [x]  Normal Affect []  Abnormal -        [x]  No Hallucinations    Other pertinent observable physical exam  findings:-        We discussed the expected course, resolution and complications of the diagnosis(es) in detail.  Medication risks, benefits, costs, interactions, and alternatives were discussed as  indicated.  I advised him to contact the office if his condition worsens, changes or fails to improve as anticipated. He expressed understanding with the diagnosis(es) and plan.       Kyle Williams, who was evaluated through a patient-initiated, synchronous (real-time) audio-video encounter, and/or his healthcare decision maker, is aware that it is a billable service, with coverage as determined by his insurance carrier. He provided verbal consent to proceed: Yes, and patient identification was verified. It was conducted pursuant to the emergency declaration under the [employer] and the [employer], [employer] waiver authority and the [employer] and [employer] Act. A caregiver was present when appropriate. Ability to conduct physical exam was limited. I was at home. The patient was at home.      Paula Libra, NP

## 2019-11-05 NOTE — Progress Notes (Signed)
Kyle Williams is a 85 y.o. male who was seen by synchronous (real-time) audio-video technology on 11/05/2019 for Annual Wellness Visit and Hypertension (follow-up)    Assessment & Plan:   Diagnoses and all orders for this visit:    1. Essential hypertension  -     CBC WITH AUTOMATED DIFF; Future  -     METABOLIC PANEL, COMPREHENSIVE; Future    2. Memory loss    3. Mixed hyperlipidemia  -     LIPID PANEL; Future    4. Screening PSA (prostate specific antigen)  -     PSA SCREENING (SCREENING); Future    5. Dementia without behavioral disturbance, unspecified dementia type (HCC)      Fasting labs ordered  Family needs a letter noting patient's memory loss      Subjective:     The patient presents for an Audio-visual teleconference appointment for routine follow-up care.  Patient has a medical diagnosis for hypertension, hyperlipidemia and memory loss.  Patient is alert to person only.  Per his caregiver/son and wife his memory has progressively worsened since April, 2020.  Per the wife, her husband is like his 55-year-old.  They deny any behavioral disturbances but notes he does get frustrated at times.  The family is requesting a letter noting he has dementia so that his daughter can file taxes for 2019 at 2020.  Hypertension-patient is compliant with taking his medications daily.  He notes he negative for chest pain or palpitation.  He denies any cough, fever, or GI upset.    Prior to Admission medications    Medication Sig Start Date End Date Taking? Authorizing Provider   amLODIPine-benazepril (LOTREL) 10-40 mg per capsule Take 1 Cap by mouth daily. 10/28/19  Yes Paula Libra, NP   finasteride (Proscar) 5 mg tablet Take 1 Tab by mouth daily. 10/28/19  Yes Paula Libra, NP   peg 400-propylene glycol (SYSTANE, PROPYLENE GLYCOL,) 0.4-0.3 % drop as needed.   Yes Provider, Historical   multivitamin (ONE A DAY) tablet Take 1 Tab by mouth daily.   Yes Provider, Historical    memantine (NAMENDA) 10 mg tablet take 1 tablet by mouth twice a day 07/27/19   Paula Libra, NP   C/sourcherry/celery/grape seed (TART CHERRY PO) Take  by mouth.    Provider, Historical   aspirin delayed-release 81 mg tablet Take 81 mg by mouth daily.    Provider, Historical     Patient Active Problem List   Diagnosis Code   ??? Essential hypertension I10   ??? Hyperlipidemia E78.5   ??? Memory loss R41.3   ??? Benign prostatic hyperplasia N40.0   ??? Seasonal allergic rhinitis J30.2   ??? Constipation K59.00   ??? Dementia without behavioral disturbance (HCC) F03.90     Patient Active Problem List    Diagnosis Date Noted   ??? Dementia without behavioral disturbance (HCC) 11/05/2019   ??? Essential hypertension 05/06/2017   ??? Hyperlipidemia 05/06/2017   ??? Memory loss 05/06/2017   ??? Benign prostatic hyperplasia 05/06/2017   ??? Seasonal allergic rhinitis 05/06/2017   ??? Constipation 05/06/2017     Current Outpatient Medications   Medication Sig Dispense Refill   ??? amLODIPine-benazepril (LOTREL) 10-40 mg per capsule Take 1 Cap by mouth daily. 90 Cap 1   ??? finasteride (Proscar) 5 mg tablet Take 1 Tab by mouth daily. 90 Tab 1   ??? peg 400-propylene glycol (SYSTANE, PROPYLENE GLYCOL,) 0.4-0.3 % drop as needed.     ???  multivitamin (ONE A DAY) tablet Take 1 Tab by mouth daily.     ??? memantine (NAMENDA) 10 mg tablet take 1 tablet by mouth twice a day 60 Tab 3   ??? C/sourcherry/celery/grape seed (TART CHERRY PO) Take  by mouth.     ??? aspirin delayed-release 81 mg tablet Take 81 mg by mouth daily.       No Known Allergies  Past Medical History:   Diagnosis Date   ??? Constipation    ??? H/O seasonal allergies    ??? History of BPH    ??? Hypercholesterolemia    ??? Hypertension    ??? Memory loss        ROS    Constitutional: No apparent distress noted  General- negative for fever, chills or fatigue  Eyes- negative visual changes  CV- denies chest pain, palpitation  Pul: negative cough or SOB  GI: negative nausea, flank pain, diarrhea, constipation   Urinary:- No dysuria or polyuria  MS- negative myalgia, negative joint pain  Neuro- negative headache, dizziness or weakness  Skin- negative for rashes or lesions.  Psych- dementia    Objective:     Patient-Reported Vitals 11/05/2019   Patient-Reported Weight 172 lb   Patient-Reported Pulse 67   Patient-Reported Temperature 97.7   Patient-Reported Systolic  140   Patient-Reported Diastolic 91        [INSTRUCTIONS:  "[x] " Indicates a positive item  "[] " Indicates a negative item  -- DELETE ALL ITEMS NOT EXAMINED]    Constitutional: [x]  Appears well-developed and well-nourished [x]  No apparent distress      []  Abnormal -     Mental status: [x]  Alert and awake  [x]  Oriented to person/place/time [x]  Able to follow commands    []  Abnormal -     Eyes:   EOM    [x]   Normal    []  Abnormal -   Sclera  [x]   Normal    []  Abnormal -          Discharge [x]   None visible   []  Abnormal -     HENT: [x]  Normocephalic, atraumatic  []  Abnormal -   [x]  Mouth/Throat: Mucous membranes are moist    External Ears [x]  Normal  []  Abnormal -    Neck: [x]  No visualized mass []  Abnormal -     Pulmonary/Chest: [x]  Respiratory effort normal   [x]  No visualized signs of difficulty breathing or respiratory distress        []  Abnormal -      Musculoskeletal:   [x]  Normal gait with no signs of ataxia         [x]  Normal range of motion of neck        []  Abnormal -     Neurological:        [x]  No Facial Asymmetry (Cranial nerve 7 motor function) (limited exam due to video visit)          [x]  No gaze palsy        []  Abnormal -          Skin:        [x]  No significant exanthematous lesions or discoloration noted on facial skin         []  Abnormal -            Psychiatric:       [x]  Normal Affect []  Abnormal -        [x]  No Hallucinations    Other pertinent observable physical exam  findings:-         We discussed the expected course, resolution and complications of the diagnosis(es) in detail.  Medication risks, benefits, costs, interactions, and alternatives were discussed as indicated.  I advised him to contact the office if his condition worsens, changes or fails to improve as anticipated. He expressed understanding with the diagnosis(es) and plan.       Johnney Killian, who was evaluated through a patient-initiated, synchronous (real-time) audio-video encounter, and/or his healthcare decision maker, is aware that it is a billable service, with coverage as determined by his insurance carrier. He provided verbal consent to proceed: Yes, and patient identification was verified. It was conducted pursuant to the emergency declaration under the [place], [place] waiver authority and the [employer] and [employer] Act. A caregiver was present when appropriate. Ability to conduct physical exam was limited. I was at home. The patient was at home.      Candie Echevaria, NP

## 2019-11-05 NOTE — Progress Notes (Signed)
This is the Subsequent Medicare Annual Wellness Exam, performed 12 months or more after the Initial AWV or the last Subsequent AWV    I have reviewed the patient's medical history in detail and updated the computerized patient record.     Depression Risk Factor Screening:     3 most recent PHQ Screens 11/05/2019   Little interest or pleasure in doing things Not at all   Feeling down, depressed, irritable, or hopeless Not at all   Total Score PHQ 2 0       Alcohol Risk Screen   Do you average more than 1 drink per night or more than 7 drinks a week: No    In the past three months have you have had more than 4 drinks containing alcohol on one occasion: No      Functional Ability and Level of Safety:   Hearing: The patient needs further evaluation.     Activities of Daily Living:  The home contains: grab bars  Patient does total self care     Ambulation: with no difficulty     Fall Risk:  Fall Risk Assessment, last 12 mths 11/05/2019   Able to walk? Yes   Fall in past 12 months? No     Abuse Screen:  Patient is not abused       Cognitive Screening   Has your family/caregiver stated any concerns about your memory: Yes    Cognitive Screening: Normal - Patient has a history of dementia    Assessment/Plan   Education and counseling provided:  Are appropriate based on today's review and evaluation    Diagnoses and all orders for this visit:    1. Essential hypertension  -     CBC WITH AUTOMATED DIFF; Future  -     METABOLIC PANEL, COMPREHENSIVE; Future    2. Memory loss    3. Mixed hyperlipidemia  -     LIPID PANEL; Future    4. Screening PSA (prostate specific antigen)  -     PSA SCREENING (SCREENING); Future    5. Dementia without behavioral disturbance, unspecified dementia type [facility])        Health Maintenance Due     Health Maintenance Due   Topic Date Due   ??? DTaP/Tdap/Td series (1 - Tdap) 07/15/1956   ??? Shingrix Vaccine Age 33> (1 of 2) 07/15/1985   ??? GLAUCOMA SCREENING Q2Y  07/15/2000    ??? Pneumococcal 65+ years (1 of 1 - PPSV23) 07/15/2000   ??? Flu Vaccine (1) 07/28/2019   ??? Medicare Yearly Exam  09/03/2019       Patient Care Team   Patient Care Team:  Candie Echevaria, NP as PCP - General (Nurse Practitioner)  Candie Echevaria, NP as PCP - [facility] Empaneled Provider  Arabella Merles, MD as Physician (Urology)    History     Patient Active Problem List   Diagnosis Code   ??? Essential hypertension I10   ??? Hyperlipidemia E78.5   ??? Memory loss R41.3   ??? Benign prostatic hyperplasia N40.0   ??? Seasonal allergic rhinitis J30.2   ??? Constipation K59.00   ??? Dementia without behavioral disturbance (HCC) F03.90     Past Medical History:   Diagnosis Date   ??? Constipation    ??? H/O seasonal allergies    ??? History of BPH    ??? Hypercholesterolemia    ??? Hypertension    ??? Memory loss       Past Surgical  History:   Procedure Laterality Date   ??? HX HERNIA REPAIR     ??? HX KNEE REPLACEMENT Left    ??? HX ORTHOPAEDIC      bilateral shoulder replacement   ??? HX TUMOR REMOVAL Right     shoulder      Current Outpatient Medications   Medication Sig Dispense Refill   ??? amLODIPine-benazepril (LOTREL) 10-40 mg per capsule Take 1 Cap by mouth daily. 90 Cap 1   ??? finasteride (Proscar) 5 mg tablet Take 1 Tab by mouth daily. 90 Tab 1   ??? peg 400-propylene glycol (SYSTANE, PROPYLENE GLYCOL,) 0.4-0.3 % drop as needed.     ??? multivitamin (ONE A DAY) tablet Take 1 Tab by mouth daily.     ??? memantine (NAMENDA) 10 mg tablet take 1 tablet by mouth twice a day 60 Tab 3   ??? C/sourcherry/celery/grape seed (TART CHERRY PO) Take  by mouth.     ??? aspirin delayed-release 81 mg tablet Take 81 mg by mouth daily.       No Known Allergies    Family History   Problem Relation Age of Onset   ??? Cancer Mother         ovarian   ??? Cancer Father         stomach   ??? Heart Disease Maternal Grandmother      Social History     Tobacco Use   ??? Smoking status: Former Smoker   ??? Smokeless tobacco: Former Estate agent Use Topics   ??? Alcohol use: No        Kyle Williams, who was evaluated through a synchronous (real-time) audio only encounter, and/or his healthcare decision maker, is aware that it is a billable service, with coverage as determined by his insurance carrier. He provided verbal consent to proceed: n/a- consent obtained within past 12 months, and patient identification was verified. It was conducted pursuant to the emergency declaration under the [employer] and the [employer], 1135 waiver authority and the [employer] Act. A caregiver was present when appropriate. Ability to conduct physical exam was limited. I was in the office. The patient was at home.    Paula Libra, NP

## 2019-11-05 NOTE — Progress Notes (Signed)
Kyle Williams presents today for   Chief Complaint   Patient presents with   ??? Annual Wellness Visit   ??? Hypertension     follow-up       Virtual/telephone visit    Depression Screening:  3 most recent PHQ Screens 11/05/2019   Little interest or pleasure in doing things Not at all   Feeling down, depressed, irritable, or hopeless Not at all   Total Score PHQ 2 0       Learning Assessment:  Learning Assessment 05/06/2017   PRIMARY LEARNER Patient   HIGHEST LEVEL OF EDUCATION - PRIMARY LEARNER  SOME COLLEGE   BARRIERS PRIMARY LEARNER NONE   CO-LEARNER CAREGIVER No   PRIMARY LANGUAGE ENGLISH   LEARNER PREFERENCE PRIMARY READING   ANSWERED BY patient   RELATIONSHIP SELF       Abuse Screening:  Abuse Screening Questionnaire 11/05/2019   Do you ever feel afraid of your partner? N   Are you in a relationship with someone who physically or mentally threatens you? N   Is it safe for you to go home? Y       Fall Risk  Fall Risk Assessment, last 12 mths 11/05/2019   Able to walk? Yes   Fall in past 12 months? No         Health Maintenance reviewed and discussed and ordered per Provider.    Health Maintenance Due   Topic Date Due   ??? DTaP/Tdap/Td series (1 - Tdap) 07/15/1956   ??? Shingrix Vaccine Age 50> (1 of 2) 07/15/1985   ??? GLAUCOMA SCREENING Q2Y  07/15/2000   ??? Pneumococcal 65+ years (1 of 1 - PPSV23) 07/15/2000   ??? Flu Vaccine (1) 07/28/2019   ??? Medicare Yearly Exam  09/03/2019   .      Coordination of Care:  1. Have you been to the ER, urgent care clinic since your last visit? Hospitalized since your last visit? no    2. Have you seen or consulted any other health care providers outside of the [facility] since your last visit? Include any pap smears or colon screening. no

## 2019-11-05 NOTE — Patient Instructions (Signed)
Medicare Wellness Visit, Male    The best way to live healthy is to have a lifestyle where you eat a well-balanced diet, exercise regularly, limit alcohol use, and quit all forms of tobacco/nicotine, if applicable.     Regular preventive services are another way to keep healthy. Preventive services (vaccines, screening tests, monitoring & exams) can help personalize your care plan, which helps you manage your own care. Screening tests can find health problems at the earliest stages, when they are easiest to treat.   [facility] follows the current, evidence-based guidelines published by the [place] [employer] (USPSTF) when recommending preventive services for our patients. Because we follow these guidelines, sometimes recommendations change over time as research supports it. (For example, a prostate screening blood test is no longer routinely recommended for men with no symptoms).  Of course, you and your doctor may decide to screen more often for some diseases, based on your risk and co-morbidities (chronic disease you are already diagnosed with).     Preventive services for you include:  - Medicare offers their members a free annual wellness visit, which is time for you and your primary care provider to discuss and plan for your preventive service needs. Take advantage of this benefit every year!  -All adults over age 15 should receive the recommended pneumonia vaccines. Current USPSTF guidelines recommend a series of two vaccines for the best pneumonia protection.   -All adults should have a flu vaccine yearly and tetanus vaccine every 10 years.  -All adults age 67 and older should receive the shingles vaccines (series of two vaccines).       -All adults age 54-70 who are overweight should have a diabetes screening test once every three years.    -Other screening tests & preventive services for persons with diabetes include: an eye exam to screen for diabetic retinopathy, a kidney function test, a foot exam, and stricter control over your cholesterol.   -Cardiovascular screening for adults with routine risk involves an electrocardiogram (ECG) at intervals determined by the provider.   -Colorectal cancer screening should be done for adults age 83-75 with no increased risk factors for colorectal cancer.  There are a number of acceptable methods of screening for this type of cancer. Each test has its own benefits and drawbacks. Discuss with your provider what is most appropriate for you during your annual wellness visit. The different tests include: colonoscopy (considered the best screening method), a fecal occult blood test, a fecal DNA test, and sigmoidoscopy.  -All adults born between [place] should be screened once for Hepatitis C.  -An Abdominal Aortic Aneurysm (AAA) Screening is recommended for men age 77-75 who has ever smoked in their lifetime.     Here is a list of your current Health Maintenance items (your personalized list of preventive services) with a due date:  Health Maintenance Due   Topic Date Due   ??? DTaP/Tdap/Td  (1 - Tdap) 07/15/1956   ??? Shingles Vaccine (1 of 2) 07/15/1985   ??? Glaucoma Screening   07/15/2000   ??? Pneumococcal Vaccine (1 of 1 - PPSV23) 07/15/2000   ??? Yearly Flu Vaccine (1) 07/28/2019

## 2019-11-16 NOTE — Telephone Encounter (Signed)
Patient's son states he had discussed with Clydie Braun a letter stating his dad has dementia.  Patient's daughter has POA, but [employer] requires a Physicist, medical.  Please let him know when letter is ready for pick up.

## 2019-11-30 ENCOUNTER — Ambulatory Visit: Admit: 2019-11-30 | Discharge: 2019-11-30 | Payer: MEDICARE | Primary: Family

## 2019-11-30 DIAGNOSIS — Z125 Encounter for screening for malignant neoplasm of prostate: Secondary | ICD-10-CM

## 2019-11-30 NOTE — Progress Notes (Signed)
Please notify patient that lab results were reviewed and his prostate level remains elevated.  No he seen urology in the past and based on a note he does not need to return.  His PSA has decreased from prior visit.  Additionally his cholesterol panel is very elevated.  I started him on a atorvastatin 40 mg daily.  Please have him take the medication at night.

## 2019-11-30 NOTE — Progress Notes (Signed)
Please notify patient that lab results were reviewed and his prostate level remains elevated.  No he seen urology in the past and based on a note he does not need to return.  His PSA has decreased from prior visit.  Additionally his cholesterol panel is very elevated.  I started him on a atorvastatin 40 mg daily.  Please have him take the medication at night.

## 2019-12-01 ENCOUNTER — Inpatient Hospital Stay: Admit: 2019-12-01 | Payer: MEDICARE | Primary: Family

## 2019-12-01 LAB — CBC WITH AUTO DIFFERENTIAL
Basophils %: 1 % (ref 0–2)
Basophils Absolute: 0 10*3/uL (ref 0.0–0.1)
Eosinophils %: 1 % (ref 0–5)
Eosinophils Absolute: 0.1 10*3/uL (ref 0.0–0.4)
Hematocrit: 39.4 % (ref 36.0–48.0)
Hemoglobin: 13 g/dL (ref 13.0–16.0)
Lymphocytes %: 30 % (ref 21–52)
Lymphocytes Absolute: 1.9 10*3/uL (ref 0.9–3.6)
MCH: 29.5 PG (ref 24.0–34.0)
MCHC: 33 g/dL (ref 31.0–37.0)
MCV: 89.5 FL (ref 74.0–97.0)
MPV: 12.8 FL — ABNORMAL HIGH (ref 9.2–11.8)
Monocytes %: 6 % (ref 3–10)
Monocytes Absolute: 0.4 10*3/uL (ref 0.05–1.2)
Neutrophils %: 62 % (ref 40–73)
Neutrophils Absolute: 3.9 10*3/uL (ref 1.8–8.0)
Platelets: 170 10*3/uL (ref 135–420)
RBC: 4.4 M/uL — ABNORMAL LOW (ref 4.70–5.50)
RDW: 14.9 % — ABNORMAL HIGH (ref 11.6–14.5)
WBC: 6.3 10*3/uL (ref 4.6–13.2)

## 2019-12-01 LAB — COMPREHENSIVE METABOLIC PANEL
ALT: 21 U/L (ref 16–61)
AST: 17 U/L (ref 10–38)
Albumin/Globulin Ratio: 1 (ref 0.8–1.7)
Albumin: 3.8 g/dL (ref 3.4–5.0)
Alkaline Phosphatase: 84 U/L (ref 45–117)
Anion Gap: 9 mmol/L (ref 3.0–18)
BUN: 23 MG/DL — ABNORMAL HIGH (ref 7.0–18)
Bun/Cre Ratio: 16 (ref 12–20)
CO2: 25 mmol/L (ref 21–32)
Calcium: 9.5 MG/DL (ref 8.5–10.1)
Chloride: 112 mmol/L — ABNORMAL HIGH (ref 100–111)
Creatinine: 1.42 MG/DL — ABNORMAL HIGH (ref 0.6–1.3)
EGFR IF NonAfrican American: 48 mL/min/{1.73_m2} — ABNORMAL LOW (ref >60)
GFR African American: 58 mL/min/{1.73_m2} — ABNORMAL LOW (ref >60)
Globulin: 3.9 g/dL (ref 2.0–4.0)
Glucose: 91 mg/dL (ref 74–99)
Potassium: 4 mmol/L (ref 3.5–5.5)
Sodium: 146 mmol/L — ABNORMAL HIGH (ref 136–145)
Total Bilirubin: 0.4 MG/DL (ref 0.2–1.0)
Total Protein: 7.7 g/dL (ref 6.4–8.2)

## 2019-12-01 LAB — LIPID PANEL
CHOL/HDL Ratio: 3.8 (ref 0–5.0)
Chol/HDL Ratio: 3.8 (ref 0–5.0)
Cholesterol, Total: 248 MG/DL — ABNORMAL HIGH (ref <200)
Cholesterol, total: 248 MG/DL — ABNORMAL HIGH (ref <200)
HDL Cholesterol: 65 MG/DL — ABNORMAL HIGH (ref 40–60)
HDL: 65 MG/DL — ABNORMAL HIGH (ref 40–60)
LDL Calculated: 162.4 MG/DL — ABNORMAL HIGH (ref 0–100)
LDL, calculated: 162.4 MG/DL — ABNORMAL HIGH (ref 0–100)
Triglyceride: 103 MG/DL (ref <150)
Triglycerides: 103 MG/DL (ref <150)
VLDL Cholesterol Calculated: 20.6 MG/DL
VLDL, calculated: 20.6 MG/DL

## 2019-12-01 LAB — PSA PROSTATIC SPECIFIC ANTIGEN: Pros. Spec. Antigen: 14.3 ng/mL — ABNORMAL HIGH (ref 0.0–4.0)

## 2019-12-01 LAB — CBC WITH AUTOMATED DIFF
ABS. BASOPHILS: 0 10*3/uL (ref 0.0–0.1)
ABS. EOSINOPHILS: 0.1 10*3/uL (ref 0.0–0.4)
ABS. LYMPHOCYTES: 1.9 10*3/uL (ref 0.9–3.6)
ABS. MONOCYTES: 0.4 10*3/uL (ref 0.05–1.2)
ABS. NEUTROPHILS: 3.9 10*3/uL (ref 1.8–8.0)
BASOPHILS: 1 % (ref 0–2)
EOSINOPHILS: 1 % (ref 0–5)
HCT: 39.4 % (ref 36.0–48.0)
HGB: 13 g/dL (ref 13.0–16.0)
LYMPHOCYTES: 30 % (ref 21–52)
MCH: 29.5 PG (ref 24.0–34.0)
MCHC: 33 g/dL (ref 31.0–37.0)
MCV: 89.5 FL (ref 74.0–97.0)
MONOCYTES: 6 % (ref 3–10)
MPV: 12.8 FL — ABNORMAL HIGH (ref 9.2–11.8)
NEUTROPHILS: 62 % (ref 40–73)
PLATELET: 170 10*3/uL (ref 135–420)
RBC: 4.4 M/uL — ABNORMAL LOW (ref 4.70–5.50)
RDW: 14.9 % — ABNORMAL HIGH (ref 11.6–14.5)
WBC: 6.3 10*3/uL (ref 4.6–13.2)

## 2019-12-01 LAB — METABOLIC PANEL, COMPREHENSIVE
A-G Ratio: 1 (ref 0.8–1.7)
ALT (SGPT): 21 U/L (ref 16–61)
AST (SGOT): 17 U/L (ref 10–38)
Albumin: 3.8 g/dL (ref 3.4–5.0)
Alk. phosphatase: 84 U/L (ref 45–117)
Anion gap: 9 mmol/L (ref 3.0–18)
BUN/Creatinine ratio: 16 (ref 12–20)
BUN: 23 MG/DL — ABNORMAL HIGH (ref 7.0–18)
Bilirubin, total: 0.4 MG/DL (ref 0.2–1.0)
CO2: 25 mmol/L (ref 21–32)
Calcium: 9.5 MG/DL (ref 8.5–10.1)
Chloride: 112 mmol/L — ABNORMAL HIGH (ref 100–111)
Creatinine: 1.42 MG/DL — ABNORMAL HIGH (ref 0.6–1.3)
GFR est AA: 58 mL/min/{1.73_m2} — ABNORMAL LOW (ref >60)
GFR est non-AA: 48 mL/min/{1.73_m2} — ABNORMAL LOW (ref >60)
Globulin: 3.9 g/dL (ref 2.0–4.0)
Glucose: 91 mg/dL (ref 74–99)
Potassium: 4 mmol/L (ref 3.5–5.5)
Protein, total: 7.7 g/dL (ref 6.4–8.2)
Sodium: 146 mmol/L — ABNORMAL HIGH (ref 136–145)

## 2019-12-01 LAB — PSA, DIAGNOSTIC (PROSTATE SPECIFIC AG): Prostate Specific Ag: 14.3 ng/mL — ABNORMAL HIGH (ref 0.0–4.0)

## 2019-12-03 NOTE — Telephone Encounter (Signed)
TC to patient left VM for return call to [phone]. Needs daughter name for letter to be completed.

## 2019-12-10 ENCOUNTER — Encounter

## 2019-12-10 MED ORDER — ATORVASTATIN 40 MG TAB
40 mg | ORAL_TABLET | Freq: Every day | ORAL | 1 refills | Status: DC
Start: 2019-12-10 — End: 2020-10-13

## 2019-12-10 MED ORDER — MEMANTINE 10 MG TAB
10 mg | ORAL_TABLET | ORAL | 3 refills | Status: DC
Start: 2019-12-10 — End: 2020-10-13

## 2019-12-10 NOTE — Telephone Encounter (Signed)
Patient called stating he needs a letter stating his father has dementia and that he is not able to handle his financial.  Please call him when letter is ready at [phone].

## 2019-12-10 NOTE — Telephone Encounter (Signed)
Last seen 11/05/19  Next appt  None    Requested Prescriptions     Pending Prescriptions Disp Refills   ??? memantine (NAMENDA) 10 mg tablet 60 Tab 3

## 2019-12-25 NOTE — Telephone Encounter (Signed)
Please inform patient son that his can be done on Wednesday when provider return to the office. Please prepare the letter for signature.

## 2019-12-25 NOTE — Telephone Encounter (Signed)
Patient son Barrie Dunker called stating he needs a letter stating that his father can not handle his finances due to having dementia and can not speak complete sentence. Letter needs to be on letterhead and signed by provider. [name] can be reached at [phone].

## 2020-01-13 ENCOUNTER — Encounter

## 2020-03-13 ENCOUNTER — Inpatient Hospital Stay
Admit: 2020-03-13 | Discharge: 2020-03-14 | Disposition: A | Discharge status: Home / Self Care | Payer: MEDICARE | Attending: Emergency Medicine

## 2020-03-13 DIAGNOSIS — K644 Residual hemorrhoidal skin tags: Secondary | ICD-10-CM

## 2020-03-13 MED ORDER — HYDROCORTISONE 2.5 % RECTAL CREAM
2.5 % | Freq: Four times a day (QID) | CUTANEOUS | 0 refills | Status: DC
Start: 2020-03-13 — End: 2020-10-30

## 2020-03-13 MED ORDER — POLYETHYLENE GLYCOL 3350 100 % ORAL POWDER
17 gram/dose | Freq: Every day | ORAL | 0 refills | Status: DC
Start: 2020-03-13 — End: 2021-06-28

## 2020-03-13 NOTE — ED Provider Notes (Signed)
86 year old male with past medical history significant for dementia, hypertension, hyperlipidemia and BPH presents to the ED with complaints of rectal pain and loose stools for the past 2 days.  He describes the discomfort as a dull sensation and nothing seems to make it better.  The patient has rectal pain whenever he attempts to have a bowel movement but denies seeing any blood in the stool.  He also denies any fever, chills, abdominal pain, nausea, vomiting or any other complaints.  He denies smoking, drinking or recreational drug use.  Family history reviewed and noncontributory.           Past Medical History:   Diagnosis Date   ??? Constipation    ??? H/O seasonal allergies    ??? History of BPH    ??? Hypercholesterolemia    ??? Hypertension    ??? Memory loss        Past Surgical History:   Procedure Laterality Date   ??? HX HERNIA REPAIR     ??? HX KNEE REPLACEMENT Left    ??? HX ORTHOPAEDIC      bilateral shoulder replacement   ??? HX TUMOR REMOVAL Right     shoulder          Family History:   Problem Relation Age of Onset   ??? Cancer Mother         ovarian   ??? Cancer Father         stomach   ??? Heart Disease Maternal Grandmother        Social History     Socioeconomic History   ??? Marital status: MARRIED     Spouse name: Not on file   ??? Number of children: Not on file   ??? Years of education: Not on file   ??? Highest education level: Not on file   Occupational History   ??? Not on file   Social Needs   ??? Financial resource strain: Not on file   ??? Food insecurity     Worry: Not on file     Inability: Not on file   ??? Transportation needs     Medical: Not on file     Non-medical: Not on file   Tobacco Use   ??? Smoking status: Former Smoker   ??? Smokeless tobacco: Former Estate agent and Sexual Activity   ??? Alcohol use: No   ??? Drug use: No   ??? Sexual activity: Not Currently     Partners: Female   Lifestyle   ??? Physical activity     Days per week: Not on file     Minutes per session: Not on file   ??? Stress: Not on file   Relationships    ??? Social Wellsite geologist on phone: Not on file     Gets together: Not on file     Attends religious service: Not on file     Active member of club or organization: Not on file     Attends meetings of clubs or organizations: Not on file     Relationship status: Not on file   ??? Intimate partner violence     Fear of current or ex partner: Not on file     Emotionally abused: Not on file     Physically abused: Not on file     Forced sexual activity: Not on file   Other Topics Concern   ??? Not on file   Social History Narrative   ???  Not on file         ALLERGIES: Patient has no known allergies.    Review of Systems   Constitutional: Negative for activity change and appetite change.   HENT: Negative for drooling and facial swelling.    Eyes: Negative for pain and discharge.   Respiratory: Negative for apnea and choking.    Cardiovascular: Negative for palpitations and leg swelling.   Gastrointestinal: Positive for rectal pain. Negative for blood in stool.   Endocrine: Negative for polydipsia and polyphagia.   Genitourinary: Negative for genital sores and hematuria.   Musculoskeletal: Negative for gait problem and neck stiffness.   Skin: Negative for color change and rash.   Allergic/Immunologic: Negative for environmental allergies.   Neurological: Negative for tremors.   Hematological: Negative for adenopathy.   Psychiatric/Behavioral: Negative for agitation and behavioral problems.       Vitals:    03/13/20 1834 03/13/20 1835 03/13/20 1840 03/13/20 1845   BP: (!) 147/112  (!) 142/83 (!) 142/83   Pulse:   93 98   Resp:  13 14 18    Temp:    99.9 ??F (37.7 ??C)   SpO2: 92% 97% 99% 100%   Weight:    82.1 kg (181 lb)   Height:    6\' 1"  (1.854 m)            Physical Exam  Vitals signs and nursing note reviewed. Exam conducted with a chaperone present ).   Constitutional:       Appearance: Normal appearance. He is well-developed.   HENT:      Head: Normocephalic and atraumatic.   Eyes:      Extraocular Movements:  Extraocular movements intact.      Pupils: Pupils are equal, round, and reactive to light.   Cardiovascular:      Rate and Rhythm: Normal rate and regular rhythm.      Heart sounds: Normal heart sounds. No murmur. No friction rub.   Pulmonary:      Effort: Pulmonary effort is normal.      Breath sounds: Normal breath sounds.   Abdominal:      Palpations: Abdomen is soft.      Tenderness: There is no abdominal tenderness.   Genitourinary:     Comments: Palpable external hemorrhoid present at the 7 o'clock position, nontender to palpation, not thrombosed, no active bleeding.  Brown stool present in the rectal vault.  Musculoskeletal: Normal range of motion.   Skin:     General: Skin is warm and dry.      Capillary Refill: Capillary refill takes less than 2 seconds.   Neurological:      General: No focal deficit present.      Mental Status: He is alert.      Comments: Oriented x2   Psychiatric:         Mood and Affect: Mood normal.          MDM  Number of Diagnoses or Management Options  Diagnosis management comments: 86 year old male here with rectal pain and who has a obvious external hemorrhoid on physical exam.  He has normal vital signs and abdomen is completely soft and nontender to palpation; I do not feel that laboratory work-up or imaging are warranted at this time.  Patient is stable for discharge home with prescriptions for Anusol cream and stool softeners.  Family at bedside and given strict return precautions.    Pt has been reexamined. Patient has no new complaints, changes,  or physical findings.  Care plan outlined and precautions discussed.  Results were reviewed with the patient. All medications were reviewed with the patient; will d/c home with PMD f/u. All of pt's questions and concerns were addressed. Patient was instructed and agrees to follow up with PMD, as well as to return to the ED upon further deterioration. Patient is ready to go home.    This note was dictated utilizing voice recognition  software which may lead to typographical errors. ??I apologize in advance if the situation occurs. ??If questions arise please do not hesitate to contact me or call our department.    Earlie Lou, DO           Procedures

## 2020-03-13 NOTE — ED Notes (Signed)
8:13 PM  03/13/20     Discharge instructions given to patient (name) with verbalization of understanding. Patient accompanied by son, spouse.  Patient discharged with the following prescriptions Miralax, Anusol. Patient discharged to home (destination).      Butch Penny, RN

## 2020-03-13 NOTE — ED Notes (Signed)
Assumed care of pt   Introduced self to patient  Pt alert and confused   Oriented to self and place   Updated whiteboard and explained usage   Pt laying in bed   C/o stomach cramps/pains  Pt is a poor historian per dementia   Pain on bilateral sides in lower quadrant   Denied nausea or vomiting  Pt uses the bathroom independently   Son is unsure if he is able to have a formed BM   Rates pain 6/10 in lower abd  Informed pt not to eat or drink anything   Informed pt will need urine sample  Not pt has arrhythmia   Pt stated no history of heart issues   Pt positioned in bed for comfort  Updated about plan of care   Will continue to monitor and assess

## 2020-03-13 NOTE — ED Triage Notes (Signed)
Assumed care of pt   Introduced self to patient  Pt alert and confused   Oriented to self and place   Updated whiteboard and explained usage   Pt laying in bed   C/o stomach cramps/pains  Pt is a poor historian per dementia   Pain on bilateral sides in lower quadrant   Denied nausea or vomiting  Pt uses the bathroom independently   Son is unsure if he is able to have a formed BM   Rates pain 6/10 in lower abd  Informed pt not to eat or drink anything   Informed pt will need urine sample  Not pt has arrhythmia   Pt stated no history of heart issues   Pt positioned in bed for comfort  Updated about plan of care   Will continue to monitor and assess

## 2020-03-13 NOTE — ED Notes (Signed)
8:13 PM  03/13/20     Discharge instructions given to patient (name) with verbalization of understanding. Patient accompanied by son, spouse.  Patient discharged with the following prescriptions Miralax, Anusol. Patient discharged to home (destination).      Kristen M. Gatling, RN

## 2020-03-13 NOTE — ED Provider Notes (Signed)
86 year old male with past medical history significant for dementia, hypertension, hyperlipidemia and BPH presents to the ED with complaints of rectal pain and loose stools for the past 2 days.  He describes the discomfort as a dull sensation and nothing seems to make it better.  The patient has rectal pain whenever he attempts to have a bowel movement but denies seeing any blood in the stool.  He also denies any fever, chills, abdominal pain, nausea, vomiting or any other complaints.  He denies smoking, drinking or recreational drug use.  Family history reviewed and noncontributory.           Past Medical History:   Diagnosis Date   ??? Constipation    ??? H/O seasonal allergies    ??? History of BPH    ??? Hypercholesterolemia    ??? Hypertension    ??? Memory loss        Past Surgical History:   Procedure Laterality Date   ??? HX HERNIA REPAIR     ??? HX KNEE REPLACEMENT Left    ??? HX ORTHOPAEDIC      bilateral shoulder replacement   ??? HX TUMOR REMOVAL Right     shoulder          Family History:   Problem Relation Age of Onset   ??? Cancer Mother         ovarian   ??? Cancer Father         stomach   ??? Heart Disease Maternal Grandmother        Social History     Socioeconomic History   ??? Marital status: MARRIED     Spouse name: Not on file   ??? Number of children: Not on file   ??? Years of education: Not on file   ??? Highest education level: Not on file   Occupational History   ??? Not on file   Social Needs   ??? Financial resource strain: Not on file   ??? Food insecurity     Worry: Not on file     Inability: Not on file   ??? Transportation needs     Medical: Not on file     Non-medical: Not on file   Tobacco Use   ??? Smoking status: Former Smoker   ??? Smokeless tobacco: Former Estate agent and Sexual Activity   ??? Alcohol use: No   ??? Drug use: No   ??? Sexual activity: Not Currently     Partners: Female   Lifestyle   ??? Physical activity     Days per week: Not on file     Minutes per session: Not on file   ??? Stress: Not on file   Relationships    ??? Social Wellsite geologist on phone: Not on file     Gets together: Not on file     Attends religious service: Not on file     Active member of club or organization: Not on file     Attends meetings of clubs or organizations: Not on file     Relationship status: Not on file   ??? Intimate partner violence     Fear of current or ex partner: Not on file     Emotionally abused: Not on file     Physically abused: Not on file     Forced sexual activity: Not on file   Other Topics Concern   ??? Not on file   Social History Narrative   ???  Not on file         ALLERGIES: Patient has no known allergies.    Review of Systems   Constitutional: Negative for activity change and appetite change.   HENT: Negative for drooling and facial swelling.    Eyes: Negative for pain and discharge.   Respiratory: Negative for apnea and choking.    Cardiovascular: Negative for palpitations and leg swelling.   Gastrointestinal: Positive for rectal pain. Negative for blood in stool.   Endocrine: Negative for polydipsia and polyphagia.   Genitourinary: Negative for genital sores and hematuria.   Musculoskeletal: Negative for gait problem and neck stiffness.   Skin: Negative for color change and rash.   Allergic/Immunologic: Negative for environmental allergies.   Neurological: Negative for tremors.   Hematological: Negative for adenopathy.   Psychiatric/Behavioral: Negative for agitation and behavioral problems.       Vitals:    03/13/20 1834 03/13/20 1835 03/13/20 1840 03/13/20 1845   BP: (!) 147/112  (!) 142/83 (!) 142/83   Pulse:   93 98   Resp:  13 14 18   Temp:    99.9 ??F (37.7 ??C)   SpO2: 92% 97% 99% 100%   Weight:    82.1 kg (181 lb)   Height:    6' 1" (1.854 m)            Physical Exam  Vitals signs and nursing note reviewed. Exam conducted with a chaperone present (RN Tim).   Constitutional:       Appearance: Normal appearance. He is well-developed.   HENT:      Head: Normocephalic and atraumatic.   Eyes:      Extraocular Movements:  Extraocular movements intact.      Pupils: Pupils are equal, round, and reactive to light.   Cardiovascular:      Rate and Rhythm: Normal rate and regular rhythm.      Heart sounds: Normal heart sounds. No murmur. No friction rub.   Pulmonary:      Effort: Pulmonary effort is normal.      Breath sounds: Normal breath sounds.   Abdominal:      Palpations: Abdomen is soft.      Tenderness: There is no abdominal tenderness.   Genitourinary:     Comments: Palpable external hemorrhoid present at the 7 o'clock position, nontender to palpation, not thrombosed, no active bleeding.  Brown stool present in the rectal vault.  Musculoskeletal: Normal range of motion.   Skin:     General: Skin is warm and dry.      Capillary Refill: Capillary refill takes less than 2 seconds.   Neurological:      General: No focal deficit present.      Mental Status: He is alert.      Comments: Oriented x2   Psychiatric:         Mood and Affect: Mood normal.          MDM  Number of Diagnoses or Management Options  Diagnosis management comments: 86-year-old male here with rectal pain and who has a obvious external hemorrhoid on physical exam.  He has normal vital signs and abdomen is completely soft and nontender to palpation; I do not feel that laboratory work-up or imaging are warranted at this time.  Patient is stable for discharge home with prescriptions for Anusol cream and stool softeners.  Family at bedside and given strict return precautions.    Pt has been reexamined. Patient has no new complaints, changes,   or physical findings.  Care plan outlined and precautions discussed.  Results were reviewed with the patient. All medications were reviewed with the patient; will d/c home with PMD f/u. All of pt's questions and concerns were addressed. Patient was instructed and agrees to follow up with PMD, as well as to return to the ED upon further deterioration. Patient is ready to go home.    This note was dictated utilizing voice recognition  software which may lead to typographical errors. ??I apologize in advance if the situation occurs. ??If questions arise please do not hesitate to contact me or call our department.    Earlie Lou, DO           Procedures

## 2020-04-21 ENCOUNTER — Ambulatory Visit: Attending: Family | Primary: Family

## 2020-04-21 ENCOUNTER — Ambulatory Visit: Admit: 2020-04-21 | Discharge: 2020-04-21 | Payer: MEDICARE | Attending: Family | Primary: Family

## 2020-04-21 DIAGNOSIS — F0390 Unspecified dementia without behavioral disturbance: Secondary | ICD-10-CM

## 2020-04-21 NOTE — Progress Notes (Signed)
 Kyle Williams presents today for   Chief Complaint   Patient presents with   . Hypertension     follow-up       Is someone accompanying this pt? daughter    Is the patient using any DME equipment during OV? no    Depression Screening:  3 most recent PHQ Screens 04/21/2020   Little interest or pleasure in doing things Not at all   Feeling down, depressed, irritable, or hopeless Not at all   Total Score PHQ 2 0       Learning Assessment:  Learning Assessment 05/06/2017   PRIMARY LEARNER Patient   HIGHEST LEVEL OF EDUCATION - PRIMARY LEARNER  SOME COLLEGE   BARRIERS PRIMARY LEARNER NONE   CO-LEARNER CAREGIVER No   PRIMARY LANGUAGE ENGLISH   LEARNER PREFERENCE PRIMARY READING   ANSWERED BY patient   RELATIONSHIP SELF       Abuse Screening:  Abuse Screening Questionnaire 11/05/2019   Do you ever feel afraid of your partner? N   Are you in a relationship with someone who physically or mentally threatens you? N   Is it safe for you to go home? Y       Fall Risk  Fall Risk Assessment, last 12 mths 04/21/2020   Able to walk? Yes   Fall in past 12 months? 0   Do you feel unsteady? 0   Are you worried about falling 0       Health Maintenance reviewed and discussed and ordered per Provider.    Health Maintenance Due   Topic Date Due   . COVID-19 Vaccine (1) Never done   . DTaP/Tdap/Td series (1 - Tdap) Never done   . Shingrix Vaccine Age 64> (1 of 2) Never done   . Pneumococcal 65+ years (1 of 1 - PPSV23) Never done   .      Coordination of Care:  1. Have you been to the ER, urgent care clinic since your last visit? Hospitalized since your last visit? yes    2. Have you seen or consulted any other health care providers outside of the [facility] since your last visit? Include any pap smears or colon screening. no

## 2020-04-21 NOTE — Progress Notes (Signed)
Kyle Williams is a 86 y.o. male presenting today for Hypertension (follow-up)  .     Chief Complaint   Patient presents with   ??? Hypertension     follow-up       HPI:  Kyle Williams presents to the office today for routine follow-up care. He also carries a medical diagnosis for dementia that is progressively worsening. Family previously declined a referral to neurology but would like a referral today.    HTN- BP controlled at 138/82. Negative for CP or palpitation. Per the family he is compliant with the medication treatment plan,  HLD- prescribed a statin daily and negative for myalgia.     ROS    ROS:  History obtained from the patient intake forms which are reviewed with the patient  ?? General: negative for - chills, fever, weight changes or malaise  ?? HEENT: no sore throat, nasal congestion, vision problems or ear problems  ?? Respiratory: no cough, shortness of breath, or wheezing  ?? Cardiovascular: no chest pain, palpitations, or dyspnea on exertion  ?? Gastrointestinal: no abdominal pain, N/V, change in bowel habits, or black or bloody stools  ?? Musculoskeletal: no back pain, joint pain, joint stiffness, muscle pain or muscle weakness  ?? Neurological: memory loss without an behavioral disturbances  ?? Endo:  No polyuria or polydipsia.   ?? GU: no hematuria, dysuria, frequency, hesitancy, or nocturia.    ?? Psychological: negative for - anxiety, depression, sleep disturbances, suicidal or homicidal ideations    No Known Allergies    PHQ Screening   3 most recent PHQ Screens 04/21/2020   Little interest or pleasure in doing things Not at all   Feeling down, depressed, irritable, or hopeless Not at all   Total Score PHQ 2 0       History  Past Medical History:   Diagnosis Date   ??? Constipation    ??? H/O seasonal allergies    ??? History of BPH    ??? Hypercholesterolemia    ??? Hypertension    ??? Memory loss        Past Surgical History:   Procedure Laterality Date   ??? HX HERNIA REPAIR     ??? HX KNEE REPLACEMENT  Left    ??? HX ORTHOPAEDIC      bilateral shoulder replacement   ??? HX TUMOR REMOVAL Right     shoulder        Social History     Socioeconomic History   ??? Marital status: MARRIED     Spouse name: Not on file   ??? Number of children: Not on file   ??? Years of education: Not on file   ??? Highest education level: Not on file   Occupational History   ??? Not on file   Tobacco Use   ??? Smoking status: Former Smoker   ??? Smokeless tobacco: Former Chief Strategy Officer and Sexual Activity   ??? Alcohol use: No   ??? Drug use: No   ??? Sexual activity: Not Currently     Partners: Female   Other Topics Concern   ??? Not on file   Social History Narrative   ??? Not on file     Social Determinants of Health     Financial Resource Strain:    ??? Difficulty of Paying Living Expenses:    Food Insecurity:    ??? Worried About Running Out of Food in the Last Year:    ???  Ran Out of Food in the Last Year:    Transportation Needs:    ??? Lack of Transportation (Medical):    ??? Lack of Transportation (Non-Medical):    Physical Activity:    ??? Days of Exercise per Week:    ??? Minutes of Exercise per Session:    Stress:    ??? Feeling of Stress :    Social Connections:    ??? Frequency of Communication with Friends and Family:    ??? Frequency of Social Gatherings with Friends and Family:    ??? Attends Religious Services:    ??? Marine scientist or Organizations:    ??? Attends Music therapist:    ??? Marital Status:    Intimate Production manager Violence:    ??? Fear of Current or Ex-Partner:    ??? Emotionally Abused:    ??? Physically Abused:    ??? Sexually Abused:        Current Outpatient Medications   Medication Sig Dispense Refill   ??? hydrocortisone (Anusol-HC) 2.5 % rectal cream Insert  into rectum four (4) times daily. 30 g 0   ??? polyethylene glycol (Miralax) 17 gram/dose powder Take 17 g by mouth daily. 1 tablespoon with 8 oz of water daily  Indications: constipation 595 g 0   ??? atorvastatin (LIPITOR) 40 mg tablet Take 1 Tab by mouth daily. 90 Tab 1   ??? memantine  (NAMENDA) 10 mg tablet take 1 tablet by mouth twice a day 60 Tab 3   ??? amLODIPine-benazepril (LOTREL) 10-40 mg per capsule Take 1 Cap by mouth daily. 90 Cap 1   ??? finasteride (Proscar) 5 mg tablet Take 1 Tab by mouth daily. 90 Tab 1   ??? C/sourcherry/celery/grape seed (TART CHERRY PO) Take  by mouth.     ??? aspirin delayed-release 81 mg tablet Take 81 mg by mouth daily.     ??? peg 400-propylene glycol (SYSTANE, PROPYLENE GLYCOL,) 0.4-0.3 % drop as needed.     ??? multivitamin (ONE A DAY) tablet Take 1 Tab by mouth daily.           Vitals:    04/21/20 1106 04/21/20 1158   BP: (!) 159/84 138/82   Pulse: 61    Resp: 16    Temp: 96.8 ??F (36 ??C)    TempSrc: Oral    Weight: 170 lb (77.1 kg)    Height: 6' 1" (1.854 m)    PainSc:   0 - No pain        Physical Exam    No visits with results within 3 Month(s) from this visit.   Latest known visit with results is:   Hospital Outpatient Visit on 11/30/2019   Component Date Value Ref Range Status   ??? WBC 11/30/2019 6.3  4.6 - 13.2 K/uL Final   ??? RBC 11/30/2019 4.40* 4.70 - 5.50 M/uL Final   ??? HGB 11/30/2019 13.0  13.0 - 16.0 g/dL Final   ??? HCT 11/30/2019 39.4  36.0 - 48.0 % Final   ??? MCV 11/30/2019 89.5  74.0 - 97.0 FL Final   ??? MCH 11/30/2019 29.5  24.0 - 34.0 PG Final   ??? MCHC 11/30/2019 33.0  31.0 - 37.0 g/dL Final   ??? RDW 11/30/2019 14.9* 11.6 - 14.5 % Final   ??? PLATELET 11/30/2019 170  135 - 420 K/uL Final   ??? MPV 11/30/2019 12.8* 9.2 - 11.8 FL Final   ??? NEUTROPHILS 11/30/2019 62  40 - 73 % Final   ???  LYMPHOCYTES 11/30/2019 30  21 - 52 % Final   ??? MONOCYTES 11/30/2019 6  3 - 10 % Final   ??? EOSINOPHILS 11/30/2019 1  0 - 5 % Final   ??? BASOPHILS 11/30/2019 1  0 - 2 % Final   ??? ABS. NEUTROPHILS 11/30/2019 3.9  1.8 - 8.0 K/UL Final   ??? ABS. LYMPHOCYTES 11/30/2019 1.9  0.9 - 3.6 K/UL Final   ??? ABS. MONOCYTES 11/30/2019 0.4  0.05 - 1.2 K/UL Final   ??? ABS. EOSINOPHILS 11/30/2019 0.1  0.0 - 0.4 K/UL Final   ??? ABS. BASOPHILS 11/30/2019 0.0  0.0 - 0.1 K/UL Final   ??? DF 11/30/2019 AUTOMATED     Final   ??? LIPID PROFILE 11/30/2019        Final   ??? Cholesterol, total 11/30/2019 248* <200 MG/DL Final   ??? Triglyceride 11/30/2019 103  <150 MG/DL Final    Comment: The drugs N-acetylcysteine (NAC) and  Metamiszole have been found to cause falsely  low results in this chemical assay. Please  be sure to submit blood samples obtained  BEFORE administration of either of these  drugs to assure correct results.     ??? HDL Cholesterol 11/30/2019 65* 40 - 60 MG/DL Final   ??? LDL, calculated 11/30/2019 162.4* 0 - 100 MG/DL Final   ??? VLDL, calculated 11/30/2019 20.6  MG/DL Final   ??? CHOL/HDL Ratio 11/30/2019 3.8  0 - 5.0   Final   ??? Sodium 11/30/2019 146* 136 - 145 mmol/L Final   ??? Potassium 11/30/2019 4.0  3.5 - 5.5 mmol/L Final   ??? Chloride 11/30/2019 112* 100 - 111 mmol/L Final   ??? CO2 11/30/2019 25  21 - 32 mmol/L Final   ??? Anion gap 11/30/2019 9  3.0 - 18 mmol/L Final   ??? Glucose 11/30/2019 91  74 - 99 mg/dL Final   ??? BUN 11/30/2019 23* 7.0 - 18 MG/DL Final   ??? Creatinine 11/30/2019 1.42* 0.6 - 1.3 MG/DL Final   ??? BUN/Creatinine ratio 11/30/2019 16  12 - 20   Final   ??? GFR est AA 11/30/2019 58* >60 ml/min/1.47m Final   ??? GFR est non-AA 11/30/2019 48* >60 ml/min/1.79mFinal    Comment: (NOTE)  Estimated GFR is calculated using the Modification of Diet in Renal   Disease (MDRD) Study equation, reported for both African Americans   (GFRAA) and non-African Americans (GFRNA), and normalized to 1.7348m body surface area. The physician must decide which value applies to   the patient. The MDRD study equation should only be used in   individuals age 5 52 older. It has not been validated for the   following: pregnant women, patients with serious comorbid conditions,   or on certain medications, or persons with extremes of body size,   muscle mass, or nutritional status.     ??? Calcium 11/30/2019 9.5  8.5 - 10.1 MG/DL Final   ??? Bilirubin, total 11/30/2019 0.4  0.2 - 1.0 MG/DL Final   ??? ALT (SGPT) 11/30/2019 21  16 - 61 U/L  Final   ??? AST (SGOT) 11/30/2019 17  10 - 38 U/L Final   ??? Alk. phosphatase 11/30/2019 84  45 - 117 U/L Final   ??? Protein, total 11/30/2019 7.7  6.4 - 8.2 g/dL Final   ??? Albumin 11/30/2019 3.8  3.4 - 5.0 g/dL Final   ??? Globulin 11/30/2019 3.9  2.0 - 4.0 g/dL Final   ??? A-G Ratio 11/30/2019 1.0  0.8 - 1.7   Final   ??? Prostate Specific Ag 11/30/2019 14.3* 0.0 - 4.0 ng/mL Final       No results found for any visits on 04/21/20.    Patient Care Team:  Patient Care Team:  Candie Echevaria, NP as PCP - General (Nurse Practitioner)  Candie Echevaria, NP as PCP - [facility] Empaneled Provider  Arabella Merles, MD as Physician (Urology)      Assessment / Plan:      ICD-10-CM ICD-9-CM    1. Dementia without behavioral disturbance, unspecified dementia type ([place])  F03.90 294.20 REFERRAL TO NEUROLOGY      MRI BRAIN W WO CONT   2. Balance disorder  R26.89 781.99 REFERRAL TO HOME HEALTH      MRI BRAIN W WO CONT   3. Essential hypertension  I10 401.9    4. Mixed hyperlipidemia  E78.2 272.2      HTN- continue current treatment plan. Denies any side effects or adverse reactions to medications  HLD- no changes to treatment plan.   Referral to [facility] for evaluation of balance disorder  MRI ordered  Dementia- referral to Neurology  Follow-up and Dispositions    ?? Return in about 6 months (around 10/22/2020).         I asked the patient if he  had any questions and answered his  questions.  The patient stated that he understands the treatment plan and agrees with the treatment plan    This document was created with a voice activated dictation system and may contain transcription errors.

## 2020-04-21 NOTE — Progress Notes (Addendum)
Kyle Williams presents today for   Chief Complaint   Patient presents with   ??? Hypertension     follow-up       Is someone accompanying this pt? daughter    Is the patient using any DME equipment during OV? no    Depression Screening:  3 most recent PHQ Screens 04/21/2020   Little interest or pleasure in doing things Not at all   Feeling down, depressed, irritable, or hopeless Not at all   Total Score PHQ 2 0       Learning Assessment:  Learning Assessment 05/06/2017   PRIMARY LEARNER Patient   HIGHEST LEVEL OF EDUCATION - PRIMARY LEARNER  SOME COLLEGE   BARRIERS PRIMARY LEARNER NONE   CO-LEARNER CAREGIVER No   PRIMARY LANGUAGE ENGLISH   LEARNER PREFERENCE PRIMARY READING   ANSWERED BY patient   RELATIONSHIP SELF       Abuse Screening:  Abuse Screening Questionnaire 11/05/2019   Do you ever feel afraid of your partner? N   Are you in a relationship with someone who physically or mentally threatens you? N   Is it safe for you to go home? Y       Fall Risk  Fall Risk Assessment, last 12 mths 04/21/2020   Able to walk? Yes   Fall in past 12 months? 0   Do you feel unsteady? 0   Are you worried about falling 0       Health Maintenance reviewed and discussed and ordered per Provider.    Health Maintenance Due   Topic Date Due   ??? COVID-19 Vaccine (1) Never done   ??? DTaP/Tdap/Td series (1 - Tdap) Never done   ??? Shingrix Vaccine Age 50> (1 of 2) Never done   ??? Pneumococcal 65+ years (1 of 1 - PPSV23) Never done   .      Coordination of Care:  1. Have you been to the ER, urgent care clinic since your last visit? Hospitalized since your last visit? yes    2. Have you seen or consulted any other health care providers outside of the [facility] since your last visit? Include any pap smears or colon screening. no

## 2020-04-21 NOTE — Progress Notes (Signed)
Kyle Williams is a 86 y.o. male presenting today for Hypertension (follow-up)  .     Chief Complaint   Patient presents with   ??? Hypertension     follow-up       HPI:  Kyle Williams presents to the office today for routine follow-up care.    ROS    ROS:  History obtained from the patient intake forms which are reviewed with the patient  ?? General: negative for - chills, fever, weight changes or malaise  ?? HEENT: no sore throat, nasal congestion, vision problems or ear problems  ?? Respiratory: no cough, shortness of breath, or wheezing  ?? Cardiovascular: no chest pain, palpitations, or dyspnea on exertion  ?? Gastrointestinal: no abdominal pain, N/V, change in bowel habits, or black or bloody stools  ?? Musculoskeletal: no back pain, joint pain, joint stiffness, muscle pain or muscle weakness  ?? Neurological: no numbness, tingling, headache or dizziness  ?? Endo:  No polyuria or polydipsia.   ?? GU: no hematuria, dysuria, frequency, hesitancy, or nocturia.    ?? Psychological: negative for - anxiety, depression, sleep disturbances, suicidal or homicidal ideations    No Known Allergies    PHQ Screening   3 most recent PHQ Screens 04/21/2020   Little interest or pleasure in doing things Not at all   Feeling down, depressed, irritable, or hopeless Not at all   Total Score PHQ 2 0       History  Past Medical History:   Diagnosis Date   ??? Constipation    ??? H/O seasonal allergies    ??? History of BPH    ??? Hypercholesterolemia    ??? Hypertension    ??? Memory loss        Past Surgical History:   Procedure Laterality Date   ??? HX HERNIA REPAIR     ??? HX KNEE REPLACEMENT Left    ??? HX ORTHOPAEDIC      bilateral shoulder replacement   ??? HX TUMOR REMOVAL Right     shoulder        Social History     Socioeconomic History   ??? Marital status: MARRIED     Spouse name: Not on file   ??? Number of children: Not on file   ??? Years of education: Not on file   ??? Highest education level: Not on file   Occupational History   ??? Not on file    Tobacco Use   ??? Smoking status: Former Smoker   ??? Smokeless tobacco: Former Chief Strategy Officer and Sexual Activity   ??? Alcohol use: No   ??? Drug use: No   ??? Sexual activity: Not Currently     Partners: Female   Other Topics Concern   ??? Not on file   Social History Narrative   ??? Not on file     Social Determinants of Health     Financial Resource Strain:    ??? Difficulty of Paying Living Expenses:    Food Insecurity:    ??? Worried About Charity fundraiser in the Last Year:    ??? Arboriculturist in the Last Year:    Transportation Needs:    ??? Film/video editor (Medical):    ??? Lack of Transportation (Non-Medical):    Physical Activity:    ??? Days of Exercise per Week:    ??? Minutes of Exercise per Session:    Stress:    ??? Feeling of  Stress :    Social Connections:    ??? Frequency of Communication with Friends and Family:    ??? Frequency of Social Gatherings with Friends and Family:    ??? Attends Religious Services:    ??? Marine scientist or Organizations:    ??? Attends Music therapist:    ??? Marital Status:    Intimate Production manager Violence:    ??? Fear of Current or Ex-Partner:    ??? Emotionally Abused:    ??? Physically Abused:    ??? Sexually Abused:        Current Outpatient Medications   Medication Sig Dispense Refill   ??? hydrocortisone (Anusol-HC) 2.5 % rectal cream Insert  into rectum four (4) times daily. 30 g 0   ??? polyethylene glycol (Miralax) 17 gram/dose powder Take 17 g by mouth daily. 1 tablespoon with 8 oz of water daily  Indications: constipation 595 g 0   ??? atorvastatin (LIPITOR) 40 mg tablet Take 1 Tab by mouth daily. 90 Tab 1   ??? memantine (NAMENDA) 10 mg tablet take 1 tablet by mouth twice a day 60 Tab 3   ??? amLODIPine-benazepril (LOTREL) 10-40 mg per capsule Take 1 Cap by mouth daily. 90 Cap 1   ??? finasteride (Proscar) 5 mg tablet Take 1 Tab by mouth daily. 90 Tab 1   ??? C/sourcherry/celery/grape seed (TART CHERRY PO) Take  by mouth.     ??? aspirin delayed-release 81 mg tablet Take 81 mg by mouth  daily.     ??? peg 400-propylene glycol (SYSTANE, PROPYLENE GLYCOL,) 0.4-0.3 % drop as needed.     ??? multivitamin (ONE A DAY) tablet Take 1 Tab by mouth daily.           Vitals:    04/21/20 1106   BP: (!) 159/84   Pulse: 61   Resp: 16   Temp: 96.8 ??F (36 ??C)   TempSrc: Oral   Weight: 170 lb (77.1 kg)   Height: 6' 1" (1.854 m)   PainSc:   0 - No pain       Physical Exam    No visits with results within 3 Month(s) from this visit.   Latest known visit with results is:   Hospital Outpatient Visit on 11/30/2019   Component Date Value Ref Range Status   ??? WBC 11/30/2019 6.3  4.6 - 13.2 K/uL Final   ??? RBC 11/30/2019 4.40* 4.70 - 5.50 M/uL Final   ??? HGB 11/30/2019 13.0  13.0 - 16.0 g/dL Final   ??? HCT 11/30/2019 39.4  36.0 - 48.0 % Final   ??? MCV 11/30/2019 89.5  74.0 - 97.0 FL Final   ??? MCH 11/30/2019 29.5  24.0 - 34.0 PG Final   ??? MCHC 11/30/2019 33.0  31.0 - 37.0 g/dL Final   ??? RDW 11/30/2019 14.9* 11.6 - 14.5 % Final   ??? PLATELET 11/30/2019 170  135 - 420 K/uL Final   ??? MPV 11/30/2019 12.8* 9.2 - 11.8 FL Final   ??? NEUTROPHILS 11/30/2019 62  40 - 73 % Final   ??? LYMPHOCYTES 11/30/2019 30  21 - 52 % Final   ??? MONOCYTES 11/30/2019 6  3 - 10 % Final   ??? EOSINOPHILS 11/30/2019 1  0 - 5 % Final   ??? BASOPHILS 11/30/2019 1  0 - 2 % Final   ??? ABS. NEUTROPHILS 11/30/2019 3.9  1.8 - 8.0 K/UL Final   ??? ABS. LYMPHOCYTES 11/30/2019 1.9  0.9 - 3.6  K/UL Final   ??? ABS. MONOCYTES 11/30/2019 0.4  0.05 - 1.2 K/UL Final   ??? ABS. EOSINOPHILS 11/30/2019 0.1  0.0 - 0.4 K/UL Final   ??? ABS. BASOPHILS 11/30/2019 0.0  0.0 - 0.1 K/UL Final   ??? DF 11/30/2019 AUTOMATED    Final   ??? LIPID PROFILE 11/30/2019        Final   ??? Cholesterol, total 11/30/2019 248* <200 MG/DL Final   ??? Triglyceride 11/30/2019 103  <150 MG/DL Final    Comment: The drugs N-acetylcysteine (NAC) and  Metamiszole have been found to cause falsely  low results in this chemical assay. Please  be sure to submit blood samples obtained  BEFORE administration of either of these  drugs to  assure correct results.     ??? HDL Cholesterol 11/30/2019 65* 40 - 60 MG/DL Final   ??? LDL, calculated 11/30/2019 162.4* 0 - 100 MG/DL Final   ??? VLDL, calculated 11/30/2019 20.6  MG/DL Final   ??? CHOL/HDL Ratio 11/30/2019 3.8  0 - 5.0   Final   ??? Sodium 11/30/2019 146* 136 - 145 mmol/L Final   ??? Potassium 11/30/2019 4.0  3.5 - 5.5 mmol/L Final   ??? Chloride 11/30/2019 112* 100 - 111 mmol/L Final   ??? CO2 11/30/2019 25  21 - 32 mmol/L Final   ??? Anion gap 11/30/2019 9  3.0 - 18 mmol/L Final   ??? Glucose 11/30/2019 91  74 - 99 mg/dL Final   ??? BUN 11/30/2019 23* 7.0 - 18 MG/DL Final   ??? Creatinine 11/30/2019 1.42* 0.6 - 1.3 MG/DL Final   ??? BUN/Creatinine ratio 11/30/2019 16  12 - 20   Final   ??? GFR est AA 11/30/2019 58* >60 ml/min/1.72m Final   ??? GFR est non-AA 11/30/2019 48* >60 ml/min/1.756mFinal    Comment: (NOTE)  Estimated GFR is calculated using the Modification of Diet in Renal   Disease (MDRD) Study equation, reported for both African Americans   (GFRAA) and non-African Americans (GFRNA), and normalized to 1.7321m body surface area. The physician must decide which value applies to   the patient. The MDRD study equation should only be used in   individuals age 55 38 older. It has not been validated for the   following: pregnant women, patients with serious comorbid conditions,   or on certain medications, or persons with extremes of body size,   muscle mass, or nutritional status.     ??? Calcium 11/30/2019 9.5  8.5 - 10.1 MG/DL Final   ??? Bilirubin, total 11/30/2019 0.4  0.2 - 1.0 MG/DL Final   ??? ALT (SGPT) 11/30/2019 21  16 - 61 U/L Final   ??? AST (SGOT) 11/30/2019 17  10 - 38 U/L Final   ??? Alk. phosphatase 11/30/2019 84  45 - 117 U/L Final   ??? Protein, total 11/30/2019 7.7  6.4 - 8.2 g/dL Final   ??? Albumin 11/30/2019 3.8  3.4 - 5.0 g/dL Final   ??? Globulin 11/30/2019 3.9  2.0 - 4.0 g/dL Final   ??? A-G Ratio 11/30/2019 1.0  0.8 - 1.7   Final   ??? Prostate Specific Ag 11/30/2019 14.3* 0.0 - 4.0 ng/mL Final       No  results found for any visits on 04/21/20.    Patient Care Team:  Patient Care Team:  FieCandie EchevariaP as PCP - General (Nurse Practitioner)  [facility]Cand[facility]P [facility]  StrArabella Merles  MD as Physician (Urology)      Assessment / Plan:    {No Diagnosis Found}          I asked the patient if he  had any questions and answered his  questions.  The patient stated that he understands the treatment plan and agrees with the treatment plan    This document was created with a voice activated dictation system and may contain transcription errors.

## 2020-04-25 ENCOUNTER — Encounter: Primary: Family

## 2020-04-26 ENCOUNTER — Encounter: Primary: Family

## 2020-04-26 ENCOUNTER — Encounter: Admit: 2020-04-26 | Discharge: 2020-04-26 | Payer: MEDICARE | Primary: Family

## 2020-04-28 ENCOUNTER — Encounter: Admit: 2020-04-28 | Discharge: 2020-04-28 | Payer: MEDICARE | Primary: Family

## 2020-04-28 ENCOUNTER — Encounter: Primary: Family

## 2020-04-29 ENCOUNTER — Encounter: Admit: 2020-04-29 | Discharge: 2020-04-29 | Payer: MEDICARE | Primary: Family

## 2020-05-02 ENCOUNTER — Encounter: Admit: 2020-05-02 | Discharge: 2020-05-02 | Payer: MEDICARE | Primary: Family

## 2020-05-03 ENCOUNTER — Encounter: Admit: 2020-05-03 | Discharge: 2020-05-03 | Payer: MEDICARE | Primary: Family

## 2020-05-04 ENCOUNTER — Encounter: Primary: Family

## 2020-05-04 ENCOUNTER — Encounter: Admit: 2020-05-04 | Discharge: 2020-05-04 | Payer: MEDICARE | Primary: Family

## 2020-05-05 ENCOUNTER — Encounter: Primary: Family

## 2020-05-06 ENCOUNTER — Encounter: Primary: Family

## 2020-05-09 ENCOUNTER — Encounter: Primary: Family

## 2020-05-10 ENCOUNTER — Encounter: Admit: 2020-05-10 | Discharge: 2020-05-10 | Payer: MEDICARE | Primary: Family

## 2020-05-12 ENCOUNTER — Encounter: Admit: 2020-05-12 | Discharge: 2020-05-12 | Payer: MEDICARE | Primary: Family

## 2020-05-17 ENCOUNTER — Encounter: Admit: 2020-05-17 | Discharge: 2020-05-17 | Payer: MEDICARE | Primary: Family

## 2020-05-22 ENCOUNTER — Inpatient Hospital Stay: Admit: 2020-05-23 | Payer: MEDICARE | Attending: Family | Primary: Family

## 2020-05-22 DIAGNOSIS — G309 Alzheimer's disease, unspecified: Secondary | ICD-10-CM

## 2020-05-22 NOTE — Progress Notes (Signed)
Please let patient know the xray is consistent with the diagnosis of Alzheimers disease

## 2020-05-23 ENCOUNTER — Encounter

## 2020-05-23 LAB — AMB POC CREATININE
Creatinine, POC: 1.8 MG/DL — ABNORMAL HIGH (ref 0.6–1.3)
GFR African American: 44 mL/min/{1.73_m2} — ABNORMAL LOW (ref >60)
GFR Non-African American: 36 mL/min/{1.73_m2} — ABNORMAL LOW (ref >60)

## 2020-05-23 LAB — CREATININE, POC
Creatinine, POC: 1.8 MG/DL — ABNORMAL HIGH (ref 0.6–1.3)
GFRAA, POC: 44 mL/min/{1.73_m2} — ABNORMAL LOW (ref >60)
GFRNA, POC: 36 mL/min/{1.73_m2} — ABNORMAL LOW (ref >60)

## 2020-05-23 MED ORDER — GADOTERATE MEGLUMINE 0.5 MMOL/ML IV SYRINGE
0.5 mmol/mL | Freq: Once | INTRAVENOUS | Status: AC
Start: 2020-05-23 — End: 2020-05-22
  Administered 2020-05-23: 01:00:00 via INTRAVENOUS

## 2020-05-23 MED FILL — DOTAREM 0.5 MMOL/ML INTRAVENOUS SYRINGE: 0.5 mmol/mL | INTRAVENOUS | Qty: 20

## 2020-05-23 NOTE — Telephone Encounter (Signed)
 Last seen 04/21/20  Next appt  10/13/20    Requested Prescriptions     Pending Prescriptions Disp Refills   . finasteride  (Proscar ) 5 mg tablet 90 Tablet 1     Sig: Take 1 Tablet by mouth daily.   . amLODIPine -benazepril  (LOTREL ) 10-40 mg per capsule 90 Capsule 1     Sig: Take 1 Capsule by mouth daily.

## 2020-05-30 MED ORDER — AMLODIPINE-BENAZEPRIL 10 MG-40 MG CAP
10-40 mg | ORAL_CAPSULE | Freq: Every day | ORAL | 1 refills | Status: DC
Start: 2020-05-30 — End: 2020-10-13

## 2020-05-30 MED ORDER — FINASTERIDE 5 MG TAB
5 mg | ORAL_TABLET | Freq: Every day | ORAL | 1 refills | Status: DC
Start: 2020-05-30 — End: 2020-10-13

## 2020-08-20 ENCOUNTER — Emergency Department: Admit: 2020-08-20 | Payer: MEDICARE | Primary: Family

## 2020-08-20 ENCOUNTER — Inpatient Hospital Stay
Admit: 2020-08-20 | Discharge: 2020-08-20 | Disposition: A | Discharge status: Home / Self Care | Payer: MEDICARE | Attending: Emergency Medicine

## 2020-08-20 DIAGNOSIS — N433 Hydrocele, unspecified: Secondary | ICD-10-CM

## 2020-08-20 NOTE — ED Notes (Signed)
Pt is here for enlarged scrotum unknown for how long this has been ongoing    Patient is here with his son who is his spokesperson.     Pt does have dementia    Son noticed he grimaces when he walks

## 2020-08-20 NOTE — ED Provider Notes (Signed)
EMERGENCY DEPARTMENT HISTORY AND PHYSICAL EXAM    10:37 AM  Date: 08/20/2020  Patient Name: Kyle Williams    History of Presenting Illness       History Provided By:     HPI: Kyle Williams is a 86 y.o. male with medical history of hypertension, hypercholesterolemia dementia presents with enlarged scrotum that the son noticed the past few weeks while he was bathing him.  Son noticed that he grimaces when he walks and thinks that patient experiences discomfort.         PCP: Paula Libra, NP    Past History     Past Medical History:  Past Medical History:   Diagnosis Date   ??? Constipation    ??? H/O seasonal allergies    ??? History of BPH    ??? Hypercholesterolemia    ??? Hypertension    ??? Memory loss        Past Surgical History:  Past Surgical History:   Procedure Laterality Date   ??? HX HERNIA REPAIR     ??? HX KNEE REPLACEMENT Left    ??? HX ORTHOPAEDIC      bilateral shoulder replacement   ??? HX TUMOR REMOVAL Right     shoulder        Family History:  Family History   Problem Relation Age of Onset   ??? Cancer Mother         ovarian   ??? Cancer Father         stomach   ??? Heart Disease Maternal Grandmother        Social History:  Social History     Tobacco Use   ??? Smoking status: Former Smoker   ??? Smokeless tobacco: Former Estate agent Use Topics   ??? Alcohol use: No   ??? Drug use: No       Allergies:  No Known Allergies    Review of Systems   Review of Systems   Constitutional: Negative for activity change, appetite change and chills.   HENT: Negative for congestion, ear discharge, ear pain and sore throat.    Eyes: Negative for photophobia and pain.   Respiratory: Negative for cough and choking.    Cardiovascular: Negative for palpitations and leg swelling.   Gastrointestinal: Negative for anal bleeding and rectal pain.   Endocrine: Negative for polydipsia and polyuria.   Genitourinary: Negative for genital sores and urgency.   Musculoskeletal: Negative for arthralgias and myalgias.   Neurological:  Negative for dizziness, seizures and speech difficulty.   Psychiatric/Behavioral: Negative for hallucinations, self-injury and suicidal ideas.        Physical Exam     Patient Vitals for the past 12 hrs:   Temp Pulse Resp BP SpO2   08/20/20 1021 98.1 ??F (36.7 ??C) 66 19 116/79 100 %       Physical Exam  Vitals and nursing note reviewed.   Constitutional:       Appearance: He is well-developed.   HENT:      Head: Normocephalic and atraumatic.   Eyes:      General:         Right eye: No discharge.         Left eye: No discharge.   Cardiovascular:      Rate and Rhythm: Normal rate and regular rhythm.      Heart sounds: Normal heart sounds. No murmur heard.     Pulmonary:  Effort: Pulmonary effort is normal. No respiratory distress.      Breath sounds: Normal breath sounds. No stridor. No wheezing or rales.   Chest:      Chest wall: No tenderness.   Abdominal:      General: Bowel sounds are normal. There is no distension.      Palpations: Abdomen is soft.      Tenderness: There is no abdominal tenderness. There is no guarding or rebound.   Genitourinary:     Penis: Normal.       Comments: Fluctuant Scrotal swelling  No testicular tenderness or swelling  Musculoskeletal:         General: Normal range of motion.      Cervical back: Normal range of motion and neck supple.   Skin:     General: Skin is warm and dry.   Neurological:      Mental Status: He is alert and oriented to person, place, and time.         Diagnostic Study Results     Labs -  No results found for this or any previous visit (from the past 12 hour(s)).    Radiologic Studies -   No results found.      Medical Decision Making     ED Course: Progress Notes, Reevaluation, and Consults:    10:37 AM Initial assessment performed. The patients presenting problems have been discussed, and they/their family are in agreement with the care plan formulated and outlined with them.  I have encouraged them to ask questions as they arise throughout their  visit.      Provider Notes (Medical Decision Making):   Patient presents with scrotal swelling  No tenderness on exam  Bilateral hydroceles on ultrasound  Patient will be follow-up with urology            Vital Signs-Reviewed the patient's vital signs. Reviewed pt's pulse ox reading.         Records Reviewed: old medical records  -I am the first provider for this patient.  -I reviewed the vital signs, available nursing notes, past medical history, past surgical history, family history and social history.    Current Outpatient Medications   Medication Sig Dispense Refill   ??? finasteride (Proscar) 5 mg tablet Take 1 Tablet by mouth daily. 90 Tablet 1   ??? amLODIPine-benazepril (LOTREL) 10-40 mg per capsule Take 1 Capsule by mouth daily. 90 Capsule 1   ??? hydrocortisone (Anusol-HC) 2.5 % rectal cream Insert  into rectum four (4) times daily. 30 g 0   ??? polyethylene glycol (Miralax) 17 gram/dose powder Take 17 g by mouth daily. 1 tablespoon with 8 oz of water daily  Indications: constipation 595 g 0   ??? atorvastatin (LIPITOR) 40 mg tablet Take 1 Tab by mouth daily. 90 Tab 1   ??? memantine (NAMENDA) 10 mg tablet take 1 tablet by mouth twice a day (Patient not taking: Reported on 05/17/2020) 60 Tab 3   ??? C/sourcherry/celery/grape seed (TART CHERRY PO) Take  by mouth.     ??? peg 400-propylene glycol (SYSTANE, PROPYLENE GLYCOL,) 0.4-0.3 % drop as needed.     ??? multivitamin (ONE A DAY) tablet Take 1 Tab by mouth daily.          Clinical Impression     Clinical Impression: No diagnosis found.    Disposition:          This note was dictated utilizing voice recognition software which may lead to typographical errors.  I apologize in advance if the situation occurs.  If questions arise please do not hesitate to contact me or call our department.    Malachy Mood, MD  10:37 AM

## 2020-08-20 NOTE — ED Notes (Signed)
Pt discharged at this time. This RN reviewed discharge instructions at this time with the pt's son, & he does not have any questions. Pt ambulatory upon discharge, & in stable condition. Pt armband removed & shredded.

## 2020-09-02 ENCOUNTER — Ambulatory Visit: Attending: Nurse Practitioner | Primary: Family

## 2020-09-02 ENCOUNTER — Ambulatory Visit: Admit: 2020-09-02 | Discharge: 2020-09-02 | Attending: Nurse Practitioner | Primary: Family

## 2020-09-02 DIAGNOSIS — N433 Hydrocele, unspecified: Secondary | ICD-10-CM

## 2020-09-02 NOTE — Progress Notes (Signed)
Progress Notes by Fayrene Helper, NP at 09/02/20 0940                Author: Fayrene Helper, NP  Service: --  Author Type: Nurse Practitioner       Filed: 09/02/20 1302  Encounter Date: 09/02/2020  Status: Addendum          Editor: Fayrene Helper, NP (Nurse Practitioner)          Related Notes: Original Note by Fayrene Helper, NP (Nurse Practitioner) filed at 09/02/20  1255                                           ICD-10-CM  ICD-9-CM             1.  Hydrocele in adult   N43.3  603.9              ASSESSMENT:       1.  Bilateral hydroceles     Scrotal ultrasound 08/20/2020-Large bilateral simple appearing hydroceles left greater than right             2.  History of elevated PSA    Status post 2 transrectal prostate biopsies by Dr. Beatriz Chancellor.         3. Dementia                     PLAN:     We discussed treatment options including surveillance or hydrocelectomy. The hydrocelectomy procedure was discussed  in detail and risks/benefits were reviewed. We also discussed post operative expectations and healing times. He is aware that this is an outpatient procedure and he may have scrotal swelling/tenderness for up to 3 month post-op. We will continue with  surveillance given age and co-morbidities    Discussed with patient and son that no further PSAs necessary given patient's age and low likelihood of him dying from prostate cancer   Advised to wear supportive underwear and elevated when sitting    RTC in 6 months for re-evaluation                                Chief Complaint       Patient presents with        ?  Hydrocele           HISTORY OF PRESENT ILLNESS:  Kyle Williams  is a 86 y.o. male who presents  today for consultation and has been referred by NP Files-Sykes for bilateral hydroceles. The patient's son reports he was bathing his father and noted swelling in his testicles.  He was evaluated at the emergency room and underwent a scrotal ultrasound  that revealed large bilateral simple  appearing hydroceles left greater than right.  The son reports the swelling has decreased slightly.      Denies gross hematuria    Denies f,c,n,v   He is incontinent at times. Wears a diaper prn    He denies pain to scrotum and penis    Hx of constipation- resolved.       History of dementia.  total care per son      Review of Systems   Constitutional: Fever: No   Skin: Rash: No   HEENT: Hearing difficulty: No   Eyes: Blurred vision: No   Cardiovascular:  Chest pain: No   Respiratory: Shortness of breath: No   Gastrointestinal: Nausea/vomiting: No   Musculoskeletal: Back pain: No   Neurological: Weakness: No   Psychological: Memory loss: No   Comments/additional findings:                   Past Medical History:        Diagnosis  Date         ?  Constipation       ?  H/O seasonal allergies       ?  History of BPH       ?  Hypercholesterolemia       ?  Hypertension           ?  Memory loss               Past Surgical History:         Procedure  Laterality  Date          ?  HX HERNIA REPAIR         ?  HX KNEE REPLACEMENT  Left       ?  HX ORTHOPAEDIC              bilateral shoulder replacement          ?  HX TUMOR REMOVAL  Right            shoulder              Social History          Tobacco Use         ?  Smoking status:  Former Smoker     ?  Smokeless tobacco:  Former Network engineer Use Topics         ?  Alcohol use:  No         ?  Drug use:  No           No Known Allergies        Family History         Problem  Relation  Age of Onset          ?  Cancer  Mother                ovarian          ?  Cancer  Father                stomach          ?  Heart Disease  Maternal Grandmother               Current Outpatient Medications          Medication  Sig  Dispense  Refill           ?  finasteride (Proscar) 5 mg tablet  Take 1 Tablet by mouth daily.  90 Tablet  1     ?  amLODIPine-benazepril (LOTREL) 10-40 mg per capsule  Take 1 Capsule by mouth daily.  90 Capsule  1     ?  hydrocortisone (Anusol-HC) 2.5 % rectal  cream  Insert  into rectum four (4) times daily.  30 g  0     ?  polyethylene glycol (Miralax) 17 gram/dose powder  Take 17 g by mouth daily. 1 tablespoon with 8 oz of water daily  Indications: constipation  595 g  0     ?  atorvastatin (LIPITOR) 40 mg tablet  Take 1 Tab by mouth daily.  90 Tab  1     ?  memantine (NAMENDA) 10 mg tablet  take 1 tablet by mouth twice a day  60 Tab  3     ?  C/sourcherry/celery/grape seed (TART CHERRY PO)  Take  by mouth.         ?  peg 400-propylene glycol (SYSTANE, PROPYLENE GLYCOL,) 0.4-0.3 % drop  as needed.               ?  multivitamin (ONE A DAY) tablet  Take 1 Tab by mouth daily.                  PHYSICAL EXAMINATION:    Visit Vitals      Ht  6\' 2"  (1.88 m)     Wt  161 lb (73 kg)        BMI  20.67 kg/m??        Constitutional: WDWN, Pleasant and appropriate affect, No acute distress.     CV:  No peripheral swelling noted   Respiratory: No respiratory distress or difficulties   Abdomen:  No abdominal masses or tenderness. No CVA tenderness. No inguinal hernias noted.    GU Male:  09/03/2019   11/03/2019 normal to visual inspection, no erythema or irritation, Sphincter with good tone, Rectum with no hemorrhoids, fissures or masses, Prostate smooth, symmetric and anodular. Prostate is moderate  in size, firm.  Right> left   SCROTUM:  No scrotal rash or lesions noticed.  Normal bilateral testes. Large left hydrocele. Soft.    PENIS: Urethral meatus normal in location and size. No urethral discharge.   Skin: No jaundice.     Neuro/Psych:  Alert to self   Lymphatic:   No enlarged inguinal lymph nodes.           REVIEW OF LABS AND IMAGING:          Results for orders placed or performed during the hospital encounter of 05/22/20     CREATININE, POC         Result  Value  Ref Range            Creatinine, POC  1.8 (H)  0.6 - 1.3 MG/DL       GFRAA, POC  44 (L)  >60 ml/min/1.53m2            GFRNA, POC  36 (L)  >60 ml/min/1.13m2              Imaging Report Reviewed?  YES   Type:   ultrasound positive               A copy of today's office visit with all pertinent imaging results and labs were sent to the referring provider, NP Fields-Sykes.           75m, NP-C   [facility]    [address]    [place], [place]    Phone: [phone]     Fax: [phone]      [address] [place], [address]   [place], [place]   P: [phone]    F: [phone]

## 2020-10-13 ENCOUNTER — Ambulatory Visit: Attending: Family | Primary: Family

## 2020-10-13 ENCOUNTER — Ambulatory Visit: Admit: 2020-10-13 | Discharge: 2020-10-13 | Payer: MEDICARE | Attending: Family | Primary: Family

## 2020-10-13 DIAGNOSIS — Z23 Encounter for immunization: Secondary | ICD-10-CM

## 2020-10-13 MED ORDER — ATORVASTATIN 40 MG TAB
40 mg | ORAL_TABLET | Freq: Every day | ORAL | 1 refills | Status: DC
Start: 2020-10-13 — End: 2021-01-18

## 2020-10-13 MED ORDER — AMLODIPINE-BENAZEPRIL 10 MG-40 MG CAP
10-40 mg | ORAL_CAPSULE | Freq: Every day | ORAL | 1 refills | Status: DC
Start: 2020-10-13 — End: 2021-01-18

## 2020-10-13 MED ORDER — FINASTERIDE 5 MG TAB
5 mg | ORAL_TABLET | Freq: Every day | ORAL | 1 refills | Status: DC
Start: 2020-10-13 — End: 2021-01-18

## 2020-10-13 MED ORDER — MEMANTINE 10 MG TAB
10 mg | ORAL_TABLET | ORAL | 3 refills | Status: DC
Start: 2020-10-13 — End: 2021-01-26

## 2020-10-13 NOTE — Progress Notes (Signed)
Kyle Williams is a 86 y.o. male presenting today for Hypertension (follow-up)  .     Chief Complaint   Patient presents with   ??? Hypertension     follow-up       HPI:  Kyle Williams presents to the office today for routine follow-up care. He also carries a medical diagnosis for dementia that is progressively worsening. Family previously declined a referral to neurology but would like a referral today.    HTN- BP controlled at 130/82. Negative for CP or palpitation. Per the family he is compliant with the medication treatment plan,   HLD- prescribed a statin daily and negative for myalgia.   Per the son the patient dementia is worsening and he has difficulty folowing commands.     ROS    ROS:  History obtained from the patient intake forms which are reviewed with the patient  ?? General: negative for - chills, fever, weight changes or malaise  ?? HEENT: no sore throat, nasal congestion, vision problems or ear problems  ?? Respiratory: no cough, shortness of breath, or wheezing  ?? Cardiovascular: no chest pain, palpitations, or dyspnea on exertion  ?? Gastrointestinal: no abdominal pain, N/V, change in bowel habits, or black or bloody stools  ?? Musculoskeletal: no back pain, joint pain, joint stiffness, muscle pain or muscle weakness  ?? Neurological: memory loss without an behavioral disturbances  ?? Endo:  No polyuria or polydipsia.   ?? GU: no hematuria, dysuria, frequency, hesitancy, or nocturia.    ?? Psychological: negative for - anxiety, depression, sleep disturbances, suicidal or homicidal ideations    No Known Allergies    PHQ Screening   3 most recent PHQ Screens 04/21/2020   Little interest or pleasure in doing things Not at all   Feeling down, depressed, irritable, or hopeless Not at all   Total Score PHQ 2 0       History  Past Medical History:   Diagnosis Date   ??? Constipation    ??? H/O seasonal allergies    ??? History of BPH    ??? Hypercholesterolemia    ??? Hypertension    ??? Memory loss        Past Surgical  History:   Procedure Laterality Date   ??? HX HERNIA REPAIR     ??? HX KNEE REPLACEMENT Left    ??? HX ORTHOPAEDIC      bilateral shoulder replacement   ??? HX TUMOR REMOVAL Right     shoulder        Social History     Socioeconomic History   ??? Marital status: MARRIED     Spouse name: Not on file   ??? Number of children: Not on file   ??? Years of education: Not on file   ??? Highest education level: Not on file   Occupational History   ??? Not on file   Tobacco Use   ??? Smoking status: Former Smoker   ??? Smokeless tobacco: Former Estate agent and Sexual Activity   ??? Alcohol use: No   ??? Drug use: No   ??? Sexual activity: Not Currently     Partners: Female   Other Topics Concern   ??? Not on file   Social History Narrative   ??? Not on file     Social Determinants of Health     Financial Resource Strain:    ??? Difficulty of Paying Living Expenses: Not on file   Food Insecurity:    ???  Worried About Programme researcher, broadcasting/film/videounning Out of Food in the Last Year: Not on file   ??? Ran Out of Food in the Last Year: Not on file   Transportation Needs:    ??? Lack of Transportation (Medical): Not on file   ??? Lack of Transportation (Non-Medical): Not on file   Physical Activity:    ??? Days of Exercise per Week: Not on file   ??? Minutes of Exercise per Session: Not on file   Stress:    ??? Feeling of Stress : Not on file   Social Connections:    ??? Frequency of Communication with Friends and Family: Not on file   ??? Frequency of Social Gatherings with Friends and Family: Not on file   ??? Attends Religious Services: Not on file   ??? Active Member of Clubs or Organizations: Not on file   ??? Attends BankerClub or Organization Meetings: Not on file   ??? Marital Status: Not on file   Intimate Partner Violence:    ??? Fear of Current or Ex-Partner: Not on file   ??? Emotionally Abused: Not on file   ??? Physically Abused: Not on file   ??? Sexually Abused: Not on file   Housing Stability:    ??? Unable to Pay for Housing in the Last Year: Not on file   ??? Number of Places Lived in the Last Year: Not on  file   ??? Unstable Housing in the Last Year: Not on file       Current Outpatient Medications   Medication Sig Dispense Refill   ??? memantine (NAMENDA) 10 mg tablet take 1 tablet by mouth twice a day 60 Tablet 3   ??? amLODIPine-benazepril (LOTREL) 10-40 mg per capsule Take 1 Capsule by mouth daily. 90 Capsule 1   ??? atorvastatin (LIPITOR) 40 mg tablet Take 1 Tablet by mouth daily. 90 Tablet 1   ??? finasteride (Proscar) 5 mg tablet Take 1 Tablet by mouth daily. 90 Tablet 1   ??? polyethylene glycol (Miralax) 17 gram/dose powder Take 17 g by mouth daily. 1 tablespoon with 8 oz of water daily  Indications: constipation 595 g 0   ??? peg 400-propylene glycol (SYSTANE, PROPYLENE GLYCOL,) 0.4-0.3 % drop as needed.     ??? multivitamin (ONE A DAY) tablet Take 1 Tab by mouth daily.     ??? hydrocortisone (Anusol-HC) 2.5 % rectal cream Insert  into rectum four (4) times daily. (Patient not taking: Reported on 10/13/2020) 30 g 0   ??? C/sourcherry/celery/grape seed (TART CHERRY PO) Take  by mouth. (Patient not taking: Reported on 10/13/2020)           Vitals:    10/13/20 1109 10/13/20 1154   BP: (!) 143/89 130/82   Pulse: 65    Resp: 16    Temp: (!) 96 ??F (35.6 ??C)    TempSrc: Oral    SpO2: 96%    Weight: 164 lb (74.4 kg)    Height: 6\' 2"  (1.88 m)    PainSc:   0 - No pain        Physical Exam    No visits with results within 3 Month(s) from this visit.   Latest known visit with results is:   Hospital Outpatient Visit on 05/22/2020   Component Date Value Ref Range Status   ??? Creatinine, POC 05/22/2020 1.8* 0.6 - 1.3 MG/DL Final   ??? GFRAA, POC 05/22/2020 44* >60 ml/min/1.6973m2 Final   ??? GFRNA, POC 05/22/2020 36* >60 ml/min/1.6073m2 Final  Estimated GFR is calculated using the IDMS-traceable Modification of Diet in Renal Disease (MDRD) Study equation, reported for both African Americans (GFRAA) and non-African Americans (GFRNA), and normalized to 1.37m2 body surface area. The physician must decide which value applies to the patient.       No  results found for any visits on 10/13/20.    Patient Care Team:  Patient Care Team:  Paula Libra, NP as PCP - General (Nurse Practitioner)  Paula Libra, NP as PCP - [facility] Empaneled Provider  Cherrie Gauze, MD as Physician (Urology)      Assessment / Plan:      ICD-10-CM ICD-9-CM    1. Needs flu shot  Z23 V04.81 FLU (FLUAD QUAD INFLUENZA VACCINE,QUAD,ADJUVANTED)   2. Memory loss  R41.3 780.93 memantine (NAMENDA) 10 mg tablet   3. Essential hypertension  I10 401.9 amLODIPine-benazepril (LOTREL) 10-40 mg per capsule      LIPID PANEL      METABOLIC PANEL, COMPREHENSIVE      CBC WITH AUTOMATED DIFF   4. Mixed hyperlipidemia  E78.2 272.2 atorvastatin (LIPITOR) 40 mg tablet      LIPID PANEL      METABOLIC PANEL, COMPREHENSIVE   5. Benign prostatic hyperplasia with lower urinary tract symptoms, symptom details unspecified  N40.1 600.01 finasteride (Proscar) 5 mg tablet     HTN- continue current treatment plan. Denies any side effects or adverse reactions to medications  HLD- no changes to treatment plan.   Referral to [facility] for evaluation of balance disorder  MRI ordered  Dementia- referral to Neurology  Follow-up and Dispositions    ?? Return in about 6 months (around 04/12/2021).         I asked the patient if he  had any questions and answered his  questions.  The patient stated that he understands the treatment plan and agrees with the treatment plan    This document was created with a voice activated dictation system and may contain transcription errors.

## 2020-10-13 NOTE — Progress Notes (Signed)
Kyle Williams presents today for   Chief Complaint   Patient presents with   . Hypertension     follow-up       Is someone accompanying this pt? son    Is the patient using any DME equipment during OV? no    Depression Screening:  3 most recent PHQ Screens 04/21/2020   Little interest or pleasure in doing things Not at all   Feeling down, depressed, irritable, or hopeless Not at all   Total Score PHQ 2 0       Learning Assessment:  Learning Assessment 05/06/2017   PRIMARY LEARNER Patient   HIGHEST LEVEL OF EDUCATION - PRIMARY LEARNER  SOME COLLEGE   BARRIERS PRIMARY LEARNER NONE   CO-LEARNER CAREGIVER No   PRIMARY LANGUAGE ENGLISH   LEARNER PREFERENCE PRIMARY READING   ANSWERED BY patient   RELATIONSHIP SELF       Abuse Screening:  Abuse Screening Questionnaire 11/05/2019   Do you ever feel afraid of your partner? N   Are you in a relationship with someone who physically or mentally threatens you? N   Is it safe for you to go home? Y       Fall Risk  Fall Risk Assessment, last 12 mths 04/21/2020   Able to walk? Yes   Fall in past 12 months? 0   Do you feel unsteady? 0   Are you worried about falling 0       Health Maintenance reviewed and discussed and ordered per Provider.    Health Maintenance Due   Topic Date Due   . COVID-19 Vaccine (1) Never done   . DTaP/Tdap/Td series (1 - Tdap) Never done   . Shingrix Vaccine Age 6> (1 of 2) Never done   . Pneumococcal 65+ years (1 of 1 - PPSV23) Never done   . Flu Vaccine (1) 07/27/2020   . Medicare Yearly Exam  11/05/2020   .      Coordination of Care:  1. Have you been to the ER, urgent care clinic since your last visit? Hospitalized since your last visit? no    2. Have you seen or consulted any other health care providers outside of the [facility] since your last visit? Include any pap smears or colon screening. no

## 2020-10-13 NOTE — Progress Notes (Signed)
Kyle Williams is a 86 y.o. male who presents for routine immunizations.   He denies any symptoms , reactions or allergies that would exclude them from being immunized today.  Risks and adverse reactions were discussed and the VIS was given to them. All questions were addressed.  He refused to stay for observation. There were no reaction (s) observed when patient left clinic.

## 2020-12-23 ENCOUNTER — Ambulatory Visit: Payer: TRICARE (CHAMPUS) | Attending: Family | Primary: Family

## 2021-01-16 ENCOUNTER — Emergency Department: Admit: 2021-01-16 | Payer: MEDICARE | Primary: Family

## 2021-01-16 ENCOUNTER — Inpatient Hospital Stay
Admit: 2021-01-16 | Discharge: 2021-01-16 | Disposition: A | Discharge status: Home / Self Care | Payer: MEDICARE | Attending: Emergency Medicine

## 2021-01-16 DIAGNOSIS — R569 Unspecified convulsions: Secondary | ICD-10-CM

## 2021-01-16 LAB — URINE MICROSCOPIC ONLY
RBC, UA: 0 /hpf (ref 0–5)
RBC: 0 /hpf (ref 0–5)
WBC, UA: 0 /hpf (ref 0–5)
WBC: 0 /hpf (ref 0–5)

## 2021-01-16 LAB — BASIC METABOLIC PANEL
Anion Gap: 5 mmol/L (ref 3.0–18)
BUN: 16 MG/DL (ref 7.0–18)
Bun/Cre Ratio: 12 (ref 12–20)
CO2: 25 mmol/L (ref 21–32)
Calcium: 9.2 MG/DL (ref 8.5–10.1)
Chloride: 112 mmol/L — ABNORMAL HIGH (ref 100–111)
Creatinine: 1.33 MG/DL — ABNORMAL HIGH (ref 0.6–1.3)
EGFR IF NonAfrican American: 51 mL/min/{1.73_m2} — ABNORMAL LOW (ref >60)
GFR African American: >60 mL/min/{1.73_m2} (ref >60)
Glucose: 115 mg/dL — ABNORMAL HIGH (ref 74–99)
Potassium: 4.3 mmol/L (ref 3.5–5.5)
Sodium: 142 mmol/L (ref 136–145)

## 2021-01-16 LAB — CBC WITH AUTO DIFFERENTIAL
Basophils %: 0 % (ref 0–2)
Basophils Absolute: 0 10*3/uL (ref 0.0–0.1)
Eosinophils %: 1 % (ref 0–5)
Eosinophils Absolute: 0 10*3/uL (ref 0.0–0.4)
Granulocyte Absolute Count: 0 10*3/uL (ref 0.00–0.04)
Hematocrit: 36.7 % (ref 36.0–48.0)
Hemoglobin: 12.1 g/dL — ABNORMAL LOW (ref 13.0–16.0)
Immature Granulocytes: 0 % (ref 0.0–0.5)
Lymphocytes %: 24 % (ref 21–52)
Lymphocytes Absolute: 1.5 10*3/uL (ref 0.9–3.6)
MCH: 29.1 PG (ref 24.0–34.0)
MCHC: 33 g/dL (ref 31.0–37.0)
MCV: 88.2 FL (ref 78.0–100.0)
MPV: 11.9 FL — ABNORMAL HIGH (ref 9.2–11.8)
Monocytes %: 7 % (ref 3–10)
Monocytes Absolute: 0.4 10*3/uL (ref 0.05–1.2)
NRBC Absolute: 0 10*3/uL (ref 0.00–0.01)
Neutrophils %: 68 % (ref 40–73)
Neutrophils Absolute: 4.2 10*3/uL (ref 1.8–8.0)
Nucleated RBCs: 0 PER 100 WBC
Platelets: 106 10*3/uL — ABNORMAL LOW (ref 135–420)
RBC: 4.16 M/uL — ABNORMAL LOW (ref 4.35–5.65)
RDW: 13.6 % (ref 11.6–14.5)
WBC: 6.2 10*3/uL (ref 4.6–13.2)

## 2021-01-16 LAB — URINALYSIS W/ RFLX MICROSCOPIC
Bilirubin, Urine: NEGATIVE
Bilirubin: NEGATIVE
Glucose, Ur: NEGATIVE mg/dL
Glucose: NEGATIVE mg/dL
Ketone: NEGATIVE mg/dL
Ketones, Urine: NEGATIVE mg/dL
Leukocyte Esterase, Urine: NEGATIVE
Leukocyte Esterase: NEGATIVE
Nitrite, Urine: NEGATIVE
Nitrites: NEGATIVE
Specific Gravity, UA: 1.016 (ref 1.005–1.030)
Specific gravity: 1.016 (ref 1.005–1.030)
Urobilinogen, UA, POCT: 1 EU/dL (ref 0.2–1.0)
Urobilinogen: 1 EU/dL (ref 0.2–1.0)
pH (UA): 6.5 (ref 5.0–8.0)
pH, UA: 6.5 (ref 5.0–8.0)

## 2021-01-16 LAB — TROPONIN, HIGH SENSITIVITY: Troponin, High Sensitivity: 13 ng/L (ref 0–78)

## 2021-01-16 LAB — COVID-19, RAPID: SARS-CoV-2, Rapid: NOT DETECTED

## 2021-01-16 LAB — METABOLIC PANEL, BASIC
Anion gap: 5 mmol/L (ref 3.0–18)
BUN/Creatinine ratio: 12 (ref 12–20)
BUN: 16 MG/DL (ref 7.0–18)
CO2: 25 mmol/L (ref 21–32)
Calcium: 9.2 MG/DL (ref 8.5–10.1)
Chloride: 112 mmol/L — ABNORMAL HIGH (ref 100–111)
Creatinine: 1.33 MG/DL — ABNORMAL HIGH (ref 0.6–1.3)
GFR est AA: >60 mL/min/{1.73_m2} (ref >60)
GFR est non-AA: 51 mL/min/{1.73_m2} — ABNORMAL LOW (ref >60)
Glucose: 115 mg/dL — ABNORMAL HIGH (ref 74–99)
Potassium: 4.3 mmol/L (ref 3.5–5.5)
Sodium: 142 mmol/L (ref 136–145)

## 2021-01-16 LAB — CBC WITH AUTOMATED DIFF
ABS. BASOPHILS: 0 10*3/uL (ref 0.0–0.1)
ABS. EOSINOPHILS: 0 10*3/uL (ref 0.0–0.4)
ABS. IMM. GRANS.: 0 10*3/uL (ref 0.00–0.04)
ABS. LYMPHOCYTES: 1.5 10*3/uL (ref 0.9–3.6)
ABS. MONOCYTES: 0.4 10*3/uL (ref 0.05–1.2)
ABS. NEUTROPHILS: 4.2 10*3/uL (ref 1.8–8.0)
ABSOLUTE NRBC: 0 10*3/uL (ref 0.00–0.01)
BASOPHILS: 0 % (ref 0–2)
EOSINOPHILS: 1 % (ref 0–5)
HCT: 36.7 % (ref 36.0–48.0)
HGB: 12.1 g/dL — ABNORMAL LOW (ref 13.0–16.0)
IMMATURE GRANULOCYTES: 0 % (ref 0.0–0.5)
LYMPHOCYTES: 24 % (ref 21–52)
MCH: 29.1 PG (ref 24.0–34.0)
MCHC: 33 g/dL (ref 31.0–37.0)
MCV: 88.2 FL (ref 78.0–100.0)
MONOCYTES: 7 % (ref 3–10)
MPV: 11.9 FL — ABNORMAL HIGH (ref 9.2–11.8)
NEUTROPHILS: 68 % (ref 40–73)
NRBC: 0 PER 100 WBC
PLATELET: 106 10*3/uL — ABNORMAL LOW (ref 135–420)
RBC: 4.16 M/uL — ABNORMAL LOW (ref 4.35–5.65)
RDW: 13.6 % (ref 11.6–14.5)
WBC: 6.2 10*3/uL (ref 4.6–13.2)

## 2021-01-16 LAB — COVID-19 RAPID TEST: COVID-19 rapid test: NOT DETECTED

## 2021-01-16 LAB — TROPONIN-HIGH SENSITIVITY: Troponin-High Sensitivity: 13 ng/L (ref 0–78)

## 2021-01-16 NOTE — ED Notes (Signed)
Pts daughter at bedside, states pt needs to urinate. Bedside commode placed, pt assisted to stand X 2 person assist. Pt tolerated well.      1220: Pt off floor to CT scan

## 2021-01-16 NOTE — ED Notes (Signed)
Pt back from CT scan in stable condition. Sitter remains at bedside with pt family

## 2021-01-16 NOTE — ED Notes (Signed)
I have reviewed discharge instructions with the patient and caregiver.  The patient and caregiver verbalized understanding. IV discontinued prior to pt leaving ED. Pt left ED in stable condition with care giver

## 2021-01-16 NOTE — ED Provider Notes (Signed)
EMERGENCY DEPARTMENT HISTORY AND PHYSICAL EXAM    I have evaluated the patient at 10:53 AM      Date: 01/16/2021  Patient Name: Kyle Williams    History of Presenting Illness     Chief Complaint   Patient presents with   ??? Altered mental status         History Provided By: Patient  Location/Duration/Severity/Modifying factors   The patient is an 87 year old male with history of baseline dementia, hypertension, hypercholesterolemia presenting to the emergency department for evaluation of altered mental status.  Patient is an unreliable historian given his altered mental status and his dementia.  Per family, patient has been not as responsive and not acting his baseline.  He is normally calm and able to answer yes/no questions but this morning he seemed to be hyperactive and combative and not answering questions appropriately.  No symptoms or recent trauma    I was able to ascertain more history from the patient's daughter is not bedside.  She states that she heard him scream this morning and when she entered the room found him arched back with his arms across his chest violently shaking.  This lasted less than a minute and afterward became unconscious and unresponsive.  Daughter reports that he has no prior seizure history and has never had seizures previously.          PCP: Paula Libra, NP    Current Outpatient Medications   Medication Sig Dispense Refill   ??? memantine (NAMENDA) 10 mg tablet take 1 tablet by mouth twice a day 60 Tablet 3   ??? amLODIPine-benazepril (LOTREL) 10-40 mg per capsule Take 1 Capsule by mouth daily. 90 Capsule 1   ??? atorvastatin (LIPITOR) 40 mg tablet Take 1 Tablet by mouth daily. 90 Tablet 1   ??? finasteride (Proscar) 5 mg tablet Take 1 Tablet by mouth daily. 90 Tablet 1   ??? polyethylene glycol (Miralax) 17 gram/dose powder Take 17 g by mouth daily. 1 tablespoon with 8 oz of water daily  Indications: constipation 595 g 0   ??? peg 400-propylene glycol (SYSTANE, PROPYLENE GLYCOL,)  0.4-0.3 % drop as needed.     ??? multivitamin (ONE A DAY) tablet Take 1 Tab by mouth daily.         Past History     Past Medical History:  Past Medical History:   Diagnosis Date   ??? Constipation    ??? H/O seasonal allergies    ??? History of BPH    ??? Hypercholesterolemia    ??? Hypertension    ??? Memory loss        Past Surgical History:  Past Surgical History:   Procedure Laterality Date   ??? HX HERNIA REPAIR     ??? HX KNEE REPLACEMENT Left    ??? HX ORTHOPAEDIC      bilateral shoulder replacement   ??? HX TUMOR REMOVAL Right     shoulder        Family History:  Family History   Problem Relation Age of Onset   ??? Cancer Mother         ovarian   ??? Cancer Father         stomach   ??? Heart Disease Maternal Grandmother        Social History:  Social History     Tobacco Use   ??? Smoking status: Former Smoker   ??? Smokeless tobacco: Former Estate agent Use Topics   ??? Alcohol use:  No   ??? Drug use: No       Allergies:  No Known Allergies      Review of Systems       Review of Systems   Unable to perform ROS: Dementia         Physical Exam     Visit Vitals  BP (!) 152/107 (BP 1 Location: Left arm, BP Patient Position: At rest)   Pulse 72   Temp 97.2 ??F (36.2 ??C)   Resp 15   SpO2 100%         Physical Exam  Constitutional:       General: He is not in acute distress.     Appearance: He is not toxic-appearing.   HENT:      Head: Normocephalic and atraumatic.      Mouth/Throat:      Mouth: Mucous membranes are moist.   Eyes:      Extraocular Movements: Extraocular movements intact.      Pupils: Pupils are equal, round, and reactive to light.   Cardiovascular:      Rate and Rhythm: Normal rate and regular rhythm.      Heart sounds: Normal heart sounds. No murmur heard.  No friction rub. No gallop.    Pulmonary:      Effort: Pulmonary effort is normal.      Breath sounds: Normal breath sounds.   Abdominal:      General: There is no distension.      Palpations: Abdomen is soft. There is no mass.      Tenderness: There is no abdominal  tenderness. There is no guarding.      Hernia: No hernia is present.   Musculoskeletal:         General: No swelling, tenderness or deformity.      Cervical back: Normal range of motion and neck supple.   Skin:     General: Skin is warm and dry.      Capillary Refill: Capillary refill takes 2 to 3 seconds.      Findings: No rash.   Neurological:      General: No focal deficit present.      Mental Status: He is disoriented and confused.      GCS: GCS eye subscore is 4. GCS verbal subscore is 5. GCS motor subscore is 6.   Psychiatric:         Mood and Affect: Mood normal.           Diagnostic Study Results     Labs -  Recent Results (from the past 12 hour(s))   EKG, 12 LEAD, INITIAL    Collection Time: 01/16/21 10:56 AM   Result Value Ref Range    Ventricular Rate 72 BPM    Atrial Rate 72 BPM    P-R Interval 238 ms    QRS Duration 144 ms    Q-T Interval 454 ms    QTC Calculation (Bezet) 497 ms    Calculated P Axis 73 degrees    Calculated R Axis -69 degrees    Calculated T Axis 20 degrees    Diagnosis       Sinus rhythm with 1st degree AV block with premature supraventricular   complexes  Right bundle branch block  Left anterior fascicular block  Bifascicular block  Minimal voltage criteria for LVH, may be normal variant  Abnormal ECG  No previous ECGs available     CBC WITH AUTOMATED DIFF    Collection Time:  01/16/21 11:14 AM   Result Value Ref Range    WBC 6.2 4.6 - 13.2 K/uL    RBC 4.16 (L) 4.35 - 5.65 M/uL    HGB 12.1 (L) 13.0 - 16.0 g/dL    HCT 28.3 15.1 - 76.1 %    MCV 88.2 78.0 - 100.0 FL    MCH 29.1 24.0 - 34.0 PG    MCHC 33.0 31.0 - 37.0 g/dL    RDW 60.7 37.1 - 06.2 %    PLATELET 106 (L) 135 - 420 K/uL    MPV 11.9 (H) 9.2 - 11.8 FL    NRBC 0.0 0 PER 100 WBC    ABSOLUTE NRBC 0.00 0.00 - 0.01 K/uL    NEUTROPHILS 68 40 - 73 %    LYMPHOCYTES 24 21 - 52 %    MONOCYTES 7 3 - 10 %    EOSINOPHILS 1 0 - 5 %    BASOPHILS 0 0 - 2 %    IMMATURE GRANULOCYTES 0 0.0 - 0.5 %    ABS. NEUTROPHILS 4.2 1.8 - 8.0 K/UL    ABS.  LYMPHOCYTES 1.5 0.9 - 3.6 K/UL    ABS. MONOCYTES 0.4 0.05 - 1.2 K/UL    ABS. EOSINOPHILS 0.0 0.0 - 0.4 K/UL    ABS. BASOPHILS 0.0 0.0 - 0.1 K/UL    ABS. IMM. GRANS. 0.0 0.00 - 0.04 K/UL    DF AUTOMATED     METABOLIC PANEL, BASIC    Collection Time: 01/16/21 11:14 AM   Result Value Ref Range    Sodium 142 136 - 145 mmol/L    Potassium 4.3 3.5 - 5.5 mmol/L    Chloride 112 (H) 100 - 111 mmol/L    CO2 25 21 - 32 mmol/L    Anion gap 5 3.0 - 18 mmol/L    Glucose 115 (H) 74 - 99 mg/dL    BUN 16 7.0 - 18 MG/DL    Creatinine 6.94 (H) 0.6 - 1.3 MG/DL    BUN/Creatinine ratio 12 12 - 20      GFR est AA >60 >60 ml/min/1.41m2    GFR est non-AA 51 (L) >60 ml/min/1.90m2    Calcium 9.2 8.5 - 10.1 MG/DL   TROPONIN-HIGH SENSITIVITY    Collection Time: 01/16/21 11:14 AM   Result Value Ref Range    Troponin-High Sensitivity 13 0 - 78 ng/L   COVID-19 RAPID TEST    Collection Time: 01/16/21 11:15 AM   Result Value Ref Range    Specimen source Nasopharyngeal      COVID-19 rapid test Not detected NOTD     URINALYSIS W/ RFLX MICROSCOPIC    Collection Time: 01/16/21 11:16 AM   Result Value Ref Range    Color YELLOW      Appearance CLEAR      Specific gravity 1.016 1.005 - 1.030      pH (UA) 6.5 5.0 - 8.0      Protein TRACE (A) NEG mg/dL    Glucose Negative NEG mg/dL    Ketone Negative NEG mg/dL    Bilirubin Negative NEG      Blood TRACE (A) NEG      Urobilinogen 1.0 0.2 - 1.0 EU/dL    Nitrites Negative NEG      Leukocyte Esterase Negative NEG     URINE MICROSCOPIC ONLY    Collection Time: 01/16/21 11:16 AM   Result Value Ref Range    WBC 0 to 2 0 - 5 /hpf  RBC 0 to 4 0 - 5 /hpf    Epithelial cells FEW 0 - 5 /lpf    Bacteria FEW (A) NEG /hpf    Mucus FEW (A) NEG /lpf       Radiologic Studies -   CT HEAD WO CONT   Final Result   :      1. Atrophy pattern with deep white matter changes present no evidence of an   acute bleed seen      No gross change from the MRI available June 2021            XR CHEST PORT   Final Result      No acute  diagnostic abnormality.            Medical Decision Making   I am the first provider for this patient.    I reviewed the vital signs, available nursing notes, past medical history, past surgical history, family history and social history.    Vital Signs-Reviewed the patient's vital signs.      EKG: Sinus rhythm with first-degree AV block.  Right bundle branch block    Records Reviewed: Nursing Notes, Old Medical Records, Previous Radiology Studies and Previous Laboratory Studies (Time of Review: 10:53 AM)    ED Course: Progress Notes, Reevaluation, and Consults:         Provider Notes (Medical Decision Making):   MDM  Number of Diagnoses or Management Options  Seizure [facility](HCC)  Diagnosis management comments: 87 year old male presenting for altered mental status.  He initially appeared hyperactive but throughout his ED course has calm down.  Daughter states that he is now at his mental baseline.  This physical exam was negative for any focal neurologic findings or any trauma.  Screening lab work was obtained which is largely unremarkable.  Chest x-ray negative.  CT of the head was obtained which shows no acute abnormalities.  At this time patient had a seizure followed by postictal phase but is now back to his mental baseline.  I have reassured the daughter and believe that the patient is stable for discharge home at this time.  Will give referral for neurology and have also encouraged daughter to take the patient to his primary care doctor for follow-up.  ED return precautions discussed.  Verbalizes good understanding and agreement with plan.              Diagnosis     Clinical Impression:   1. Seizure [facility](HCC)        Disposition: home    Follow-up Information     Follow up With Specialties Details Why Contact Info    [facility] EMERGENCY DEPT Emergency Medicine  As needed, If symptoms worsen [address]  [place] [id number]  [phone]    Paula LibraFields-Sykes, Karen A, NP Nurse Practitioner Call   [address]  [address]  [place] [id number]  [phone]      Vernie MurdersArasho, Belachew D, MD Neurology Call in 1 day  [address]  [address]  [place] [id number]  [phone]             Patient's Medications   Start Taking    No medications on file   Continue Taking    AMLODIPINE-BENAZEPRIL (LOTREL) 10-40 MG PER CAPSULE    Take 1 Capsule by mouth daily.    ATORVASTATIN (LIPITOR) 40 MG TABLET    Take 1 Tablet by mouth daily.    FINASTERIDE (PROSCAR) 5 MG TABLET  Take 1 Tablet by mouth daily.    MEMANTINE (NAMENDA) 10 MG TABLET    take 1 tablet by mouth twice a day    MULTIVITAMIN (ONE A DAY) TABLET    Take 1 Tab by mouth daily.    PEG 400-PROPYLENE GLYCOL (SYSTANE, PROPYLENE GLYCOL,) 0.4-0.3 % DROP    as needed.    POLYETHYLENE GLYCOL (MIRALAX) 17 GRAM/DOSE POWDER    Take 17 g by mouth daily. 1 tablespoon with 8 oz of water daily  Indications: constipation   These Medications have changed    No medications on file   Stop Taking    No medications on file     Disclaimer: Sections of this note are dictated using utilizing voice recognition software.  Minor typographical errors may be present. If questions arise, please do not hesitate to contact me or call our department.

## 2021-01-16 NOTE — ED Notes (Signed)
Pt to ED via EMS who reports family called, due to pt clinching chest as if he was in pain. Family also reports that at baseline pt has dementia but typically can shake his head yes or no, but is not doing that. Family also states that pt is being resistant which he also does not does at baseline. Pt is from home . Pt presents restless

## 2021-01-17 LAB — EKG 12-LEAD
Atrial Rate: 72 {beats}/min
P Axis: 73 degrees
P-R Interval: 238 ms
Q-T Interval: 454 ms
QRS Duration: 144 ms
QTc Calculation (Bazett): 497 ms
R Axis: -69 degrees
T Axis: 20 degrees
Ventricular Rate: 72 {beats}/min

## 2021-01-17 LAB — EKG, 12 LEAD, INITIAL
Atrial Rate: 72 {beats}/min
Calculated P Axis: 73 degrees
Calculated R Axis: -69 degrees
Calculated T Axis: 20 degrees
P-R Interval: 238 ms
Q-T Interval: 454 ms
QRS Duration: 144 ms
QTC Calculation (Bezet): 497 ms
Ventricular Rate: 72 {beats}/min

## 2021-01-18 ENCOUNTER — Encounter

## 2021-01-18 NOTE — Telephone Encounter (Signed)
 Requested Prescriptions     Pending Prescriptions Disp Refills   . amLODIPine -benazepril  (LOTREL ) 10-40 mg per capsule 90 Capsule 1     Sig: Take 1 Capsule by mouth daily.   . atorvastatin  (LIPITOR) 40 mg tablet 90 Tablet 1     Sig: Take 1 Tablet by mouth daily.   . finasteride  (Proscar ) 5 mg tablet 90 Tablet 1     Sig: Take 1 Tablet by mouth daily.

## 2021-01-23 MED ORDER — FINASTERIDE 5 MG TAB
5 mg | ORAL_TABLET | Freq: Every day | ORAL | 1 refills | Status: DC
Start: 2021-01-23 — End: 2021-02-06

## 2021-01-23 MED ORDER — AMLODIPINE-BENAZEPRIL 10 MG-40 MG CAP
10-40 mg | ORAL_CAPSULE | Freq: Every day | ORAL | 1 refills | Status: DC
Start: 2021-01-23 — End: 2021-01-26

## 2021-01-23 MED ORDER — ATORVASTATIN 40 MG TAB
40 mg | ORAL_TABLET | Freq: Every day | ORAL | 1 refills | Status: DC
Start: 2021-01-23 — End: 2021-01-26

## 2021-01-26 ENCOUNTER — Ambulatory Visit: Attending: Family | Primary: Family

## 2021-01-26 ENCOUNTER — Ambulatory Visit: Admit: 2021-01-26 | Discharge: 2021-01-26 | Payer: MEDICARE | Attending: Family | Primary: Family

## 2021-01-26 DIAGNOSIS — Z0000 Encounter for general adult medical examination without abnormal findings: Secondary | ICD-10-CM

## 2021-01-26 MED ORDER — AMLODIPINE-BENAZEPRIL 10 MG-40 MG CAP
10-40 mg | ORAL_CAPSULE | Freq: Every day | ORAL | 1 refills | Status: DC
Start: 2021-01-26 — End: 2021-09-19

## 2021-01-26 MED ORDER — TRAZODONE 50 MG TAB
50 mg | ORAL_TABLET | Freq: Every evening | ORAL | 0 refills | Status: AC
Start: 2021-01-26 — End: 2021-08-30

## 2021-01-26 MED ORDER — ATORVASTATIN 40 MG TAB
40 mg | ORAL_TABLET | Freq: Every day | ORAL | 1 refills | Status: DC
Start: 2021-01-26 — End: 2021-04-13

## 2021-01-26 MED ORDER — MEMANTINE 10 MG TAB
10 mg | ORAL_TABLET | ORAL | 3 refills | Status: DC
Start: 2021-01-26 — End: 2021-06-28

## 2021-01-26 NOTE — Progress Notes (Signed)
 Kyle Williams presents today for   Chief Complaint   Patient presents with   . Hypertension     follow-up   . Sleep Problem   . Annual Wellness Visit       Is someone accompanying this pt? daughter    Is the patient using any DME equipment during OV? no    Depression Screening:  3 most recent PHQ Screens 01/26/2021   Little interest or pleasure in doing things Not at all   Feeling down, depressed, irritable, or hopeless Not at all   Total Score PHQ 2 0   Trouble falling or staying asleep, or sleeping too much Not at all   Feeling tired or having little energy Not at all   Poor appetite, weight loss, or overeating Not at all   Feeling bad about yourself - or that you are a failure or have let yourself or your family down Not at all   Trouble concentrating on things such as school, work, reading, or watching TV Not at all   Moving or speaking so slowly that other people could have noticed; or the opposite being so fidgety that others notice Not at all   Thoughts of being better off dead, or hurting yourself in some way Not at all   PHQ 9 Score 0   How difficult have these problems made it for you to do your work, take care of your home and get along with others Not difficult at all       Learning Assessment:  Learning Assessment 05/06/2017   PRIMARY LEARNER Patient   HIGHEST LEVEL OF EDUCATION - PRIMARY LEARNER  SOME COLLEGE   BARRIERS PRIMARY LEARNER NONE   CO-LEARNER CAREGIVER No   PRIMARY LANGUAGE ENGLISH   LEARNER PREFERENCE PRIMARY READING   ANSWERED BY patient   RELATIONSHIP SELF       Abuse Screening:  Abuse Screening Questionnaire 11/05/2019   Do you ever feel afraid of your partner? N   Are you in a relationship with someone who physically or mentally threatens you? N   Is it safe for you to go home? Y       Fall Risk  Fall Risk Assessment, last 12 mths 01/26/2021   Able to walk? Yes   Fall in past 12 months? 0   Do you feel unsteady? 0   Are you worried about falling 0       Health Maintenance reviewed and  discussed and ordered per Provider.    Health Maintenance Due   Topic Date Due   . DTaP/Tdap/Td series (1 - Tdap) Never done   . Shingrix Vaccine Age 57> (1 of 2) Never done   . Pneumococcal 65+ years (1 of 1 - PPSV23) Never done   . Lipid Screen  11/29/2020   . COVID-19 Vaccine (2 - Pfizer 3-dose series) 12/05/2020   .      Coordination of Care:  1. Have you been to the ER, urgent care clinic since your last visit? Hospitalized since your last visit? yes    2. Have you seen or consulted any other health care providers outside of the [facility] since your last visit? Include any pap smears or colon screening. no

## 2021-01-26 NOTE — Progress Notes (Addendum)
Progress Notes by Paula Libra, NP at 01/26/21 1120                Author: Paula Libra, NP  Service: --  Author Type: Nurse Practitioner       Filed: 02/22/21 2132  Encounter Date: 01/26/2021  Status: Signed          Editor: Paula Libra, NP (Nurse Practitioner)                  This is the Subsequent Medicare Annual Wellness Exam, performed 12 months or more after the Initial AWV or the last Subsequent AWV      I have reviewed the patient's medical history in detail and updated the computerized patient record.            Assessment/Plan     Education and counseling provided:   Are appropriate based on today's review and evaluation      1. Medicare annual wellness visit, subsequent            Depression Risk Factor Screening           3 most recent PHQ Screens  01/26/2021        Little interest or pleasure in doing things  Not at all     Feeling down, depressed, irritable, or hopeless  Not at all     Total Score PHQ 2  0     Trouble falling or staying asleep, or sleeping too much  Not at all     Feeling tired or having little energy  Not at all     Poor appetite, weight loss, or overeating  Not at all     Feeling bad about yourself - or that you are a failure or have let yourself or your family down  Not at all     Trouble concentrating on things such as school, work, reading, or watching TV  Not at all     Moving or speaking so slowly that other people could have noticed; or the opposite being so fidgety that others notice  Not at all     Thoughts of being better off dead, or hurting yourself in some way  Not at all     PHQ 9 Score  0        How difficult have these problems made it for you to do your work, take care of your home and get along with others  Not difficult at all             Alcohol &  Drug Abuse Risk Screen      Do you average more than 1 drink per night or more than 7 drinks a week: Yes      In the past three months have you have had more than 4 drinks containing  alcohol on one occasion: Yes               Functional Ability and Level of Safety      Hearing : Hearing is good.        Activities of Daily Living:   The home contains: handrails   Patient does total self care        Ambulation: with no difficulty       Fall Risk:      Fall Risk Assessment, last 12 mths  01/26/2021        Able to walk?  Yes     Fall in past 12  months?  0     Do you feel unsteady?  0        Are you worried about falling  0         Abuse Screen:   Patient is not abused            Cognitive Screening      Has your family/caregiver stated any concerns about your memory:  yes - dementia       Cognitive Screening: Normal - dementia        Health Maintenance Due          Health Maintenance Due        Topic  Date Due         ?  DTaP/Tdap/Td series (1 - Tdap)  Never done     ?  Shingrix Vaccine Age 75> (1 of 2)  Never done     ?  Pneumococcal 65+ years (1 of 1 - PPSV23)  Never done     ?  Lipid Screen   11/29/2020         ?  COVID-19 Vaccine (2 - Pfizer 3-dose series)  12/05/2020             Patient Care Team     Patient Care Team:   Paula Libra, NP as PCP - General (Nurse Practitioner)   Paula Libra, NP as PCP - [facility] Empaneled Provider   Cherrie Gauze, MD as Physician (Urology)   Vernie Murders, MD as Consulting Provider (Neurology)        History          Patient Active Problem List        Diagnosis  Code         ?  Essential hypertension  I10     ?  Hyperlipidemia  E78.5     ?  Memory loss  R41.3     ?  Benign prostatic hyperplasia  N40.0     ?  Seasonal allergic rhinitis  J30.2     ?  Constipation  K59.00         ?  Dementia without behavioral disturbance (HCC)  F03.90          Past Medical History:        Diagnosis  Date         ?  Constipation       ?  H/O seasonal allergies       ?  History of BPH       ?  Hypercholesterolemia       ?  Hypertension           ?  Memory loss             Past Surgical History:         Procedure  Laterality  Date          ?  HX HERNIA REPAIR          ?  HX KNEE REPLACEMENT  Left       ?  HX ORTHOPAEDIC              bilateral shoulder replacement          ?  HX TUMOR REMOVAL  Right            shoulder           Current Outpatient Medications          Medication  Sig  Dispense  Refill           ?  amLODIPine-benazepril (LOTREL) 10-40 mg per capsule  Take 1 Capsule by mouth daily.  90 Capsule  1     ?  atorvastatin (LIPITOR) 40 mg tablet  Take 1 Tablet by mouth daily.  90 Tablet  1     ?  finasteride (Proscar) 5 mg tablet  Take 1 Tablet by mouth daily.  90 Tablet  1     ?  memantine (NAMENDA) 10 mg tablet  take 1 tablet by mouth twice a day  60 Tablet  3     ?  polyethylene glycol (Miralax) 17 gram/dose powder  Take 17 g by mouth daily. 1 tablespoon with 8 oz of water daily  Indications: constipation  595 g  0     ?  peg 400-propylene glycol (SYSTANE, PROPYLENE GLYCOL,) 0.4-0.3 % drop  as needed.               ?  multivitamin (ONE A DAY) tablet  Take 1 Tab by mouth daily.            No Known Allergies        Family History         Problem  Relation  Age of Onset          ?  Cancer  Mother                ovarian          ?  Cancer  Father                stomach          ?  Heart Disease  Maternal Grandmother            Social History          Tobacco Use         ?  Smoking status:  Former Smoker     ?  Smokeless tobacco:  Former Network engineer Use Topics         ?  Alcohol use:  No              Yong Channel

## 2021-01-26 NOTE — Progress Notes (Signed)
Progress Notes by Paula Libra, NP at 01/26/21 1120                Author: Paula Libra, NP  Service: --  Author Type: Nurse Practitioner       Filed: 02/22/21 2132  Encounter Date: 01/26/2021  Status: Signed          Editor: Paula Libra, NP (Nurse Practitioner)               Kyle Williams is a 87 y.o.  male presenting today for Hypertension (follow-up), Sleep Problem, and Annual Wellness  Visit   .         Chief Complaint       Patient presents with        ?  Hypertension             follow-up        ?  Sleep Problem        ?  Annual Wellness Visit           HPI:  Kyle Williams presents  to the office today for annual wellness follow-up care.      Patient memory has decliined. He was previously alert to person.  Persons   Patient was admitted to the emergency department altered mental status and a discharge diagnosis of seizures.  He was transported to the ED via EMS who reported the family called due to patient clenching chest as if he was in pain.  At baseline patient  has dementia but typically can shake his head yes and no but is not doing that now.  Per the family, they were told the seizure was called by dehydration.       Per the patient daughter,  he has been complaining of right shoulder pain.  Notes pain with increased range of motion.  Daughter is concerned he injured his right shoulder      Review of Systems    Constitutional: Negative for malaise/fatigue.    Respiratory: Negative for cough and shortness of breath.     Cardiovascular: Negative for chest pain, palpitations and leg swelling.    Gastrointestinal: Negative for vomiting.    Musculoskeletal: Positive for joint pain. Negative for back pain and myalgias.    Neurological: Positive for seizures. Negative for dizziness and headaches.    Psychiatric/Behavioral: Positive for memory loss.          No Known Allergies      PHQ Screening       3 most recent PHQ Screens  01/26/2021        Little interest or pleasure in  doing things  Not at all     Feeling down, depressed, irritable, or hopeless  Not at all     Total Score PHQ 2  0     Trouble falling or staying asleep, or sleeping too much  Not at all     Feeling tired or having little energy  Not at all     Poor appetite, weight loss, or overeating  Not at all     Feeling bad about yourself - or that you are a failure or have let yourself or your family down  Not at all     Trouble concentrating on things such as school, work, reading, or watching TV  Not at all     Moving or speaking so slowly that other people could have noticed; or the opposite being so fidgety that others notice  Not at all     Thoughts of being better off dead, or hurting yourself in some way  Not at all     PHQ 9 Score  0        How difficult have these problems made it for you to do your work, take care of your home and get along with others  Not difficult at all           History     Past Medical History:        Diagnosis  Date         ?  Constipation       ?  H/O seasonal allergies       ?  History of BPH       ?  Hypercholesterolemia       ?  Hypertension           ?  Memory loss               Past Surgical History:         Procedure  Laterality  Date          ?  HX HERNIA REPAIR         ?  HX KNEE REPLACEMENT  Left       ?  HX ORTHOPAEDIC              bilateral shoulder replacement          ?  HX TUMOR REMOVAL  Right            shoulder              Social History          Socioeconomic History         ?  Marital status:  MARRIED              Spouse name:  Not on file         ?  Number of children:  Not on file     ?  Years of education:  Not on file     ?  Highest education level:  Not on file       Occupational History        ?  Not on file       Tobacco Use         ?  Smoking status:  Former Smoker     ?  Smokeless tobacco:  Former Network engineer and Sexual Activity         ?  Alcohol use:  No     ?  Drug use:  No     ?  Sexual activity:  Not Currently              Partners:  Female         Other Topics  Concern        ?  Not on file       Social History Narrative        ?  Not on file          Social Determinants of Health          Financial Resource Strain:         ?  Difficulty of Paying Living Expenses: Not on file       Food Insecurity:         ?  Worried About Running Out  of Food in the Last Year: Not on file     ?  Ran Out of Food in the Last Year: Not on file       Transportation Needs:         ?  Lack of Transportation (Medical): Not on file     ?  Lack of Transportation (Non-Medical): Not on file       Physical Activity:         ?  Days of Exercise per Week: Not on file     ?  Minutes of Exercise per Session: Not on file       Stress:         ?  Feeling of Stress : Not on file       Social Connections:         ?  Frequency of Communication with Friends and Family: Not on file     ?  Frequency of Social Gatherings with Friends and Family: Not on file     ?  Attends Religious Services: Not on file     ?  Active Member of Clubs or Organizations: Not on file     ?  Attends BankerClub or Organization Meetings: Not on file     ?  Marital Status: Not on file       Intimate Partner Violence:         ?  Fear of Current or Ex-Partner: Not on file     ?  Emotionally Abused: Not on file     ?  Physically Abused: Not on file     ?  Sexually Abused: Not on file       Housing Stability:         ?  Unable to Pay for Housing in the Last Year: Not on file     ?  Number of Places Lived in the Last Year: Not on file        ?  Unstable Housing in the Last Year: Not on file             Current Outpatient Medications          Medication  Sig  Dispense  Refill           ?  amLODIPine-benazepril (LOTREL) 10-40 mg per capsule  Take 1 Capsule by mouth daily.  90 Capsule  1     ?  atorvastatin (LIPITOR) 40 mg tablet  Take 1 Tablet by mouth daily.  90 Tablet  1     ?  memantine (NAMENDA) 10 mg tablet  take 1 tablet by mouth twice a day  60 Tablet  3     ?  traZODone (DESYREL) 50 mg tablet  Take 1 Tablet by mouth nightly.  90  Tablet  0     ?  polyethylene glycol (Miralax) 17 gram/dose powder  Take 17 g by mouth daily. 1 tablespoon with 8 oz of water daily  Indications: constipation  595 g  0     ?  peg 400-propylene glycol (SYSTANE, PROPYLENE GLYCOL,) 0.4-0.3 % drop  as needed.         ?  multivitamin (ONE A DAY) tablet  Take 1 Tab by mouth daily.         ?  finasteride (Proscar) 5 mg tablet  Take 1 Tablet by mouth daily.  90 Tablet  1     ?  levETIRAcetam (KEPPRA) 500 mg tablet  Take 1 Tablet  by mouth two (2) times a day.  120 Tablet  1           ?  donepeziL (Aricept) 5 mg tablet  Take 1 Tablet by mouth nightly.  60 Tablet  1                Vitals:           01/26/21 1125  01/26/21 1204         BP:  (!) 148/90  110/60     Pulse:  (!) 53       Resp:  16       Temp:  97 ??F (36.1 ??C)       TempSrc:  Oral       SpO2:  99%       Weight:  160 lb (72.6 kg)       Height:   (1.88 m)           PainSc:    0 - No pain             Physical Exam   Vitals and nursing note reviewed.   Constitutional:        Appearance: Normal appearance.   HENT :       Head: Normocephalic.   Cardiovascular :       Rate and Rhythm: Normal rate and regular rhythm.      Pulses: Normal pulses.      Heart sounds: Normal heart sounds.    Pulmonary:       Effort: Pulmonary effort is normal.      Breath sounds: Normal breath sounds.   Musculoskeletal :          General: No swelling or tenderness.    Skin:      General: Skin is warm and dry.   Neurological :       General: No focal deficit present.      Mental Status: He is alert. He is disoriented.              Admission on 01/16/2021, Discharged on 01/16/2021           Component  Date  Value  Ref Range  Status            ?  Ventricular Rate  01/16/2021  72   BPM  Final     ?  Atrial Rate  01/16/2021  72   BPM  Final     ?  P-R Interval  01/16/2021  238   ms  Final     ?  QRS Duration  01/16/2021  144   ms  Final     ?  Q-T Interval  01/16/2021  454   ms  Final     ?  QTC Calculation (Bezet)  01/16/2021  497   ms  Final      ?  Calculated P Axis  01/16/2021  73   degrees  Final     ?  Calculated R Axis  01/16/2021  -69   degrees  Final     ?  Calculated T Axis  01/16/2021  20   degrees  Final     ?  Diagnosis  01/16/2021       Final                        Value:Sinus rhythm with 1st degree AV block with premature supraventricular    complexes  Right bundle branch block   Left anterior fascicular block   Bifascicular block   Minimal voltage criteria for LVH, may be normal variant   Abnormal ECG   No previous ECGs available   Confirmed by Merdis Delay [phone]) on 01/17/2021 8:26:58 AM               ?  WBC  01/16/2021  6.2   4.6 - 13.2 K/uL  Final     ?  RBC  01/16/2021  4.16*  4.35 - 5.65 M/uL  Final     ?  HGB  01/16/2021  12.1*  13.0 - 16.0 g/dL  Final     ?  HCT  96/02/5408  36.7   36.0 - 48.0 %  Final     ?  MCV  01/16/2021  88.2   78.0 - 100.0 FL  Final     ?  MCH  01/16/2021  29.1   24.0 - 34.0 PG  Final     ?  MCHC  01/16/2021  33.0   31.0 - 37.0 g/dL  Final     ?  RDW  81/19/1478  13.6   11.6 - 14.5 %  Final     ?  PLATELET  01/16/2021  106*  135 - 420 K/uL  Final     ?  MPV  01/16/2021  11.9*  9.2 - 11.8 FL  Final     ?  NRBC  01/16/2021  0.0   0 PER 100 WBC  Final     ?  ABSOLUTE NRBC  01/16/2021  0.00   0.00 - 0.01 K/uL  Final     ?  NEUTROPHILS  01/16/2021  68   40 - 73 %  Final     ?  LYMPHOCYTES  01/16/2021  24   21 - 52 %  Final     ?  MONOCYTES  01/16/2021  7   3 - 10 %  Final     ?  EOSINOPHILS  01/16/2021  1   0 - 5 %  Final     ?  BASOPHILS  01/16/2021  0   0 - 2 %  Final     ?  IMMATURE GRANULOCYTES  01/16/2021  0   0.0 - 0.5 %  Final     ?  ABS. NEUTROPHILS  01/16/2021  4.2   1.8 - 8.0 K/UL  Final     ?  ABS. LYMPHOCYTES  01/16/2021  1.5   0.9 - 3.6 K/UL  Final     ?  ABS. MONOCYTES  01/16/2021  0.4   0.05 - 1.2 K/UL  Final     ?  ABS. EOSINOPHILS  01/16/2021  0.0   0.0 - 0.4 K/UL  Final     ?  ABS. BASOPHILS  01/16/2021  0.0   0.0 - 0.1 K/UL  Final     ?  ABS. IMM. GRANS.  01/16/2021  0.0   0.00 - 0.04 K/UL   Final     ?  DF  01/16/2021  AUTOMATED      Final     ?  Sodium  01/16/2021  142   136 - 145 mmol/L  Final     ?  Potassium  01/16/2021  4.3   3.5 - 5.5 mmol/L  Final     ?  Chloride  01/16/2021  112*  100 - 111 mmol/L  Final     ?  CO2  01/16/2021  25   21 - 32 mmol/L  Final     ?  Anion gap  01/16/2021  5   3.0 - 18 mmol/L  Final     ?  Glucose  01/16/2021  115*  74 - 99 mg/dL  Final     ?  BUN  47/82/9562  16   7.0 - 18 MG/DL  Final     ?  Creatinine  01/16/2021  1.33*  0.6 - 1.3 MG/DL  Final     ?  BUN/Creatinine ratio  01/16/2021  12   12 - 20    Final     ?  GFR est AA  01/16/2021  >60   >60 ml/min/1.43m2  Final     ?  GFR est non-AA  01/16/2021  51*  >60 ml/min/1.49m2  Final          Comment: (NOTE)   Estimated GFR is calculated using the Modification of Diet in Renal    Disease (MDRD) Study equation, reported for both African Americans    (GFRAA) and non-African Americans (GFRNA), and normalized to 1.44m2    body surface area. The physician must decide which value applies to    the patient. The MDRD study equation should only be used in    individuals age 71 or older. It has not been validated for the    following: pregnant women, patients with serious comorbid conditions,    or on certain medications, or persons with extremes of body size,    muscle mass, or nutritional status.               ?  Calcium  01/16/2021  9.2   8.5 - 10.1 MG/DL  Final     ?  Troponin-High Sensitivity  01/16/2021  13   0 - 78 ng/L  Final          Up to 50% of normal patients may have low levels of detectable troponin (within reference range) circulating in the blood. Mild elevations in troponin  that are above the reference range, may be present with other cardiac and non-cardiac disease states. Two or three serial tests at two hour intervals, are recommended to evaluate changes in circulating troponin over time.            ?  Color  01/16/2021  YELLOW      Final     ?  Appearance  01/16/2021  CLEAR      Final     ?  Specific  gravity  01/16/2021  1.016   1.005 - 1.030    Final     ?  pH (UA)  01/16/2021  6.5   5.0 - 8.0    Final     ?  Protein  01/16/2021  TRACE*  NEG mg/dL  Final            ?  Glucose  01/16/2021  Negative   NEG mg/dL  Final            ?  Ketone  01/16/2021  Negative   NEG mg/dL  Final     ?  Bilirubin  01/16/2021  Negative   NEG    Final     ?  Blood  01/16/2021  TRACE*  NEG    Final     ?  Urobilinogen  01/16/2021  1.0   0.2 - 1.0 EU/dL  Final     ?  Nitrites  01/16/2021  Negative   NEG  Final     ?  Leukocyte Esterase  01/16/2021  Negative   NEG    Final     ?  WBC  01/16/2021  0 to 2   0 - 5 /hpf  Final     ?  RBC  01/16/2021  0 to 4   0 - 5 /hpf  Final     ?  Epithelial cells  01/16/2021  FEW   0 - 5 /lpf  Final     ?  Bacteria  01/16/2021  FEW*  NEG /hpf  Final     ?  Mucus  01/16/2021  FEW*  NEG /lpf  Final     ?  Specimen source  01/16/2021  Nasopharyngeal      Final     ?  COVID-19 rapid test  01/16/2021  Not detected   NOTD    Final          Comment: Rapid Abbott ID Now         Rapid NAAT:  The specimen is NEGATIVE for SARS-CoV-2, the novel coronavirus associated with COVID-19.         Negative results should be treated as presumptive and, if inconsistent with clinical signs and symptoms or necessary for patient management, should be tested with an alternative molecular assay.   Negative results do not preclude SARS-CoV-2 infection and should not be used as the sole basis for patient management decisions.         This test has been authorized by the FDA under an Emergency Use Authorization (EUA) for use by authorized laboratories.    Fact sheet for Healthcare Providers: FlickSafe.gl   Fact sheet for Patients: [place].at         Methodology: Isothermal Nucleic Acid Amplification              No results found for any visits on 01/26/21.      Patient Care Team:   Patient Care Team:   Paula Libra, NP as PCP - General (Nurse Practitioner)    Paula Libra, NP as PCP - [facility] Empaneled Provider   Cherrie Gauze, MD as Physician (Urology)   Vernie Murders, MD as Consulting Provider (Neurology)         Assessment / Plan:                ICD-10-CM  ICD-9-CM             1.  Medicare annual wellness visit, subsequent   Z00.00  V70.0       2.  Essential hypertension   I10  401.9  amLODIPine-benazepril (LOTREL) 10-40 mg per capsule     3.  Mixed hyperlipidemia   E78.2  272.2  atorvastatin (LIPITOR) 40 mg tablet     4.  Memory loss   R41.3  780.93  memantine (NAMENDA) 10 mg tablet     5.  Right shoulder pain, unspecified chronicity   M25.511  719.41  XR SHOULDER RT AP/LAT MIN 2 V           6.  Insomnia, unspecified type   G47.00  780.52  traZODone (DESYREL) 50 mg tablet           Medication refill   X-ray of the right shoulder   Trazodone for insomnia         I asked the patient if he  had any questions and answered his   questions.   The patient stated that he understands  the treatment plan and agrees with the treatment plan      This document was created with a voice activated dictation system and may contain transcription errors.

## 2021-02-02 ENCOUNTER — Ambulatory Visit: Attending: Neurology | Primary: Family

## 2021-02-02 ENCOUNTER — Encounter

## 2021-02-02 ENCOUNTER — Ambulatory Visit: Admit: 2021-02-02 | Discharge: 2021-02-02 | Payer: MEDICARE | Attending: Neurology | Primary: Family

## 2021-02-02 DIAGNOSIS — R569 Unspecified convulsions: Secondary | ICD-10-CM

## 2021-02-02 MED ORDER — LEVETIRACETAM 500 MG TAB
500 mg | ORAL_TABLET | Freq: Two times a day (BID) | ORAL | 1 refills | Status: DC
Start: 2021-02-02 — End: 2021-06-28

## 2021-02-02 MED ORDER — DONEPEZIL 5 MG TAB
5 mg | ORAL_TABLET | Freq: Every evening | ORAL | 1 refills | Status: DC
Start: 2021-02-02 — End: 2021-08-02

## 2021-02-02 NOTE — Progress Notes (Signed)
Leonard Feigel is a 87 y.o. male who is in the office as New Patient.    1. Have you been to the ER, urgent care clinic since your last visit?  Hospitalized since your last visit?No    2. Have you seen or consulted any other health care providers outside of the [facility] since your last visit?  Include any pap smears or colon screening. No

## 2021-02-02 NOTE — Progress Notes (Signed)
87 year old male with history of progressive memory loss started 5 years ago, daughter report start with short term memory getting worse, difficulty taking care of his bills, decisions, getting lost, patient was on medication for memory but not helped, patient recently have a seizure feb-21st when daughter noted screaming stiff and eyes rolled back confused lasted 1-2 minutes, patient was taken to ED and referred to neurology for further recommendations, patient not able to provide any history, difficulty following commands, limited speech, denies any pain.  No new events reported, concerned about swallowing for the last sixth month, trouble sleeping started trazodone that may have  helped, patient  Had nightmares in the past, not started on any antiepileptic, patient was not started on antiepileptic thought that it is related to dehydration.    Social History     Socioeconomic History   ??? Marital status: MARRIED     Spouse name: Not on file   ??? Number of children: Not on file   ??? Years of education: Not on file   ??? Highest education level: Not on file   Occupational History   ??? Not on file   Tobacco Use   ??? Smoking status: Former Smoker   ??? Smokeless tobacco: Former Estate agent and Sexual Activity   ??? Alcohol use: No   ??? Drug use: No   ??? Sexual activity: Not Currently     Partners: Female   Other Topics Concern   ??? Not on file   Social History Narrative   ??? Not on file     Social Determinants of Health     Financial Resource Strain:    ??? Difficulty of Paying Living Expenses: Not on file   Food Insecurity:    ??? Worried About Running Out of Food in the Last Year: Not on file   ??? Ran Out of Food in the Last Year: Not on file   Transportation Needs:    ??? Lack of Transportation (Medical): Not on file   ??? Lack of Transportation (Non-Medical): Not on file   Physical Activity:    ??? Days of Exercise per Week: Not on file   ??? Minutes of Exercise per Session: Not on file   Stress:    ??? Feeling of Stress : Not on file    Social Connections:    ??? Frequency of Communication with Friends and Family: Not on file   ??? Frequency of Social Gatherings with Friends and Family: Not on file   ??? Attends Religious Services: Not on file   ??? Active Member of Clubs or Organizations: Not on file   ??? Attends Banker Meetings: Not on file   ??? Marital Status: Not on file   Intimate Partner Violence:    ??? Fear of Current or Ex-Partner: Not on file   ??? Emotionally Abused: Not on file   ??? Physically Abused: Not on file   ??? Sexually Abused: Not on file   Housing Stability:    ??? Unable to Pay for Housing in the Last Year: Not on file   ??? Number of Places Lived in the Last Year: Not on file   ??? Unstable Housing in the Last Year: Not on file       Family History   Problem Relation Age of Onset   ??? Cancer Mother         ovarian   ??? Cancer Father         stomach   ???  Heart Disease Maternal Grandmother         Current Outpatient Medications   Medication Sig Dispense Refill   ??? amLODIPine-benazepril (LOTREL) 10-40 mg per capsule Take 1 Capsule by mouth daily. 90 Capsule 1   ??? atorvastatin (LIPITOR) 40 mg tablet Take 1 Tablet by mouth daily. 90 Tablet 1   ??? memantine (NAMENDA) 10 mg tablet take 1 tablet by mouth twice a day 60 Tablet 3   ??? traZODone (DESYREL) 50 mg tablet Take 1 Tablet by mouth nightly. 90 Tablet 0   ??? finasteride (Proscar) 5 mg tablet Take 1 Tablet by mouth daily. 90 Tablet 1   ??? polyethylene glycol (Miralax) 17 gram/dose powder Take 17 g by mouth daily. 1 tablespoon with 8 oz of water daily  Indications: constipation 595 g 0   ??? peg 400-propylene glycol (SYSTANE, PROPYLENE GLYCOL,) 0.4-0.3 % drop as needed.     ??? multivitamin (ONE A DAY) tablet Take 1 Tab by mouth daily.         Past Medical History:   Diagnosis Date   ??? Constipation    ??? H/O seasonal allergies    ??? History of BPH    ??? Hypercholesterolemia    ??? Hypertension    ??? Memory loss        Past Surgical History:   Procedure Laterality Date   ??? HX HERNIA REPAIR     ??? HX KNEE  REPLACEMENT Left    ??? HX ORTHOPAEDIC      bilateral shoulder replacement   ??? HX TUMOR REMOVAL Right     shoulder        No Known Allergies    Patient Active Problem List   Diagnosis Code   ??? Essential hypertension I10   ??? Hyperlipidemia E78.5   ??? Memory loss R41.3   ??? Benign prostatic hyperplasia N40.0   ??? Seasonal allergic rhinitis J30.2   ??? Constipation K59.00   ??? Dementia without behavioral disturbance (HCC) F03.90         Review of Systems:   As above otherwise 11 point review of systems negative including;   Constitutional no fever or chills  Skin denies rash or itching  HENT  Denies tinnitus, hearing lose  Eyes denies diplopia vision lose  Respiratory denies shortness of breath  Cardiovascular denies chest pain, dyspnea on exertion  Gastrointestinal denies nausea, vomiting, diarrhea, constipation  Genitourinary denies incontinence  Musculoskeletal denies joint pain or swelling  Endocrine admit weight change  Hematology denies easy bruising or bleeding   Neurological as above in HPI      PHYSICAL EXAMINATION:      VITAL SIGNS:    Visit Vitals  BP 126/88   Pulse (!) 55   Resp 16   Wt 75 kg (165 lb 6.4 oz)   SpO2 91%   BMI 21.24 kg/m??       GENERAL:  in no apparent distress.   EXTREMITIES: No clubbing, cyanosis, or edema is identified.  Pulses 2+ and symmetrical.    HEAD:   Ear, nose, and throat appear to be without trauma.  The patient is normocephalic.    NEUROLOGIC EXAMINATION  Mental status: Awake, very difficult following commands, limited speech, advanced dementia.  CN: VF-blink to threat EOMI, PERRLA, face sensation intact , no facial asymmetry noted, tongue midline  Motor: move all, paratonia and stiffness, no tremors, poor effort.  Sensory: intact to light touch throughout  Coordination: Difficult to assess.  DTR: Depressed  Gait: Slow broad  based, walk without assistance.        Impression:  87 year old male with history of progressive memory loss started 5 years ago, daughter report start with short  term memory getting worse, difficulty taking care of his bills, decisions, getting lost, patient was on medication for memory but not helped, patient recently have a seizure feb-21st when daughter noted screaming stiff and eyes rolled back confused lasted 1-2 minutes, patient was taken to ED and referred to neurology for further recommendations, patient not able to provide any history, difficulty following commands, limited speech, denies any pain.  No new events reported, concerned about swallowing for the last sixth month, trouble sleeping started trazodone that may have  helped, patient  Had nightmares in the past, not started on any antiepileptic, patient was not started on antiepileptic thought that it is related to dehydration.  Family history of dementia, weight changes over the year need screening by primary    ALZHEIMER'S dementia.  Progressively getting worse not on medication, failed in the past, recommend starting donepezil.  Maximizing control of risk factors, physical therapy evaluation, check B12 and TSH.  MRI BRAIN in the past C/W atrophy in hippocampal area.    SEIZURE-  Recommend starting keppra 500 mg BID could have been provoked bay dehydration.  Could be contributing to his behavior and worsening memory.      Plan:  EEG-  TSH, B12  Levetiracetam-500 mg BID  Donepezil 5 mg POQD  Physical therapy for gait safety     I spent 45 minutes with the patient in face-to-face consultation, of which greater than 50% was spent in counseling and coordination of care as described above.    PLEASE NOTE:   This document has been produced using voice recognition software. Unrecognized errors in transcription may be present.

## 2021-02-06 ENCOUNTER — Encounter

## 2021-02-06 NOTE — Telephone Encounter (Signed)
Pharmacy states they did not receive prescription on 2/27.  Requested Prescriptions     Pending Prescriptions Disp Refills   . finasteride (Proscar) 5 mg tablet 90 Tablet 1     Sig: Take 1 Tablet by mouth daily.

## 2021-02-07 NOTE — Telephone Encounter (Signed)
Amy with [facility] contacted ofc to state that order/referral needs to be revised to add a physical therapy dx along with seizures. She states once added they can contact pt to schedule.

## 2021-02-08 ENCOUNTER — Encounter

## 2021-02-09 ENCOUNTER — Encounter

## 2021-02-09 MED ORDER — FINASTERIDE 5 MG TAB
5 mg | ORAL_TABLET | Freq: Every day | ORAL | 1 refills | Status: DC
Start: 2021-02-09 — End: 2021-03-29

## 2021-02-16 ENCOUNTER — Ambulatory Visit: Payer: MEDICARE | Primary: Family

## 2021-02-28 ENCOUNTER — Encounter: Payer: MEDICARE | Primary: Family Medicine

## 2021-03-01 ENCOUNTER — Inpatient Hospital Stay: Admit: 2021-03-01 | Discharge: 2021-03-01 | Payer: MEDICARE | Attending: Neurology | Primary: Family Medicine

## 2021-03-01 DIAGNOSIS — R569 Unspecified convulsions: Secondary | ICD-10-CM

## 2021-03-01 NOTE — Procedures (Signed)
[facility]  EEG    Name:  Kyle Williams, MONDOR  MR#:   [id number]  DOB:  [birth year]  ACCOUNT #:  [id number]  DATE OF SERVICE:  03/01/2021    OUTPATIENT RECORDING    EEG NUMBER:  [facility] EEG [id number]    EEG ORDERED BY:  Leitha Bleak, MD    REASON FOR EEG:  For evaluation of seizure.    MEDICATIONS:  The patient's medications at the time of recording included Keppra.    EEG TECHNIQUE USED:  A standard 10-20 system with 16-channel recording and an EKG.  Activation procedure used was photic stimulation.  Total duration of the recording was 30 minutes.  This is an awake and sleep EEG recording.    EEG REPORT:  The background consists of diffuse, mainly theta range slowing with no significant asymmetry.  There are intermittent, frontally-dominant delta range slow waves.  There are a few spike and wave like discharges from the bilateral hemispheres, mainly involving the temporal areas.  Photic stimulation and sleep did not induce any additional discharges.    IMPRESSION:  This is a grossly abnormal EEG with the presence of slowing and a few epileptiform discharges.    CLINICAL CORRELATION:  The diffuse slowing might indicate encephalopathy, either from metabolic or toxic conditions or could be from a diffuse neurodegenerative process.  The few spike discharges might indicate susceptibility for seizure.      Vernie Murders, MD      [id number]  D:  03/12/2021 11:37  T:  03/12/2021 15:38  JOB #:  [id number]

## 2021-03-03 ENCOUNTER — Encounter: Attending: Nurse Practitioner | Primary: Family

## 2021-03-07 ENCOUNTER — Inpatient Hospital Stay: Admit: 2021-03-07 | Payer: MEDICARE | Primary: Family Medicine

## 2021-03-07 ENCOUNTER — Encounter: Attending: Nurse Practitioner | Primary: Family Medicine

## 2021-03-07 DIAGNOSIS — R569 Unspecified convulsions: Secondary | ICD-10-CM

## 2021-03-07 LAB — VITAMIN B12
Vitamin B-12: 962 pg/mL — ABNORMAL HIGH (ref 211–911)
Vitamin B12: 962 pg/mL — ABNORMAL HIGH (ref 211–911)

## 2021-03-07 LAB — TSH 3RD GENERATION
TSH: 1.54 u[IU]/mL (ref 0.36–3.74)
TSH: 1.54 u[IU]/mL (ref 0.36–3.74)

## 2021-03-10 ENCOUNTER — Encounter: Attending: Nurse Practitioner | Primary: Family Medicine

## 2021-03-21 ENCOUNTER — Encounter: Attending: Nurse Practitioner | Primary: Family

## 2021-03-29 ENCOUNTER — Encounter

## 2021-03-29 NOTE — Telephone Encounter (Signed)
 Requested Prescriptions     Pending Prescriptions Disp Refills   . finasteride  (Proscar ) 5 mg tablet 90 Tablet 1     Sig: Take 1 Tablet by mouth daily.

## 2021-04-04 ENCOUNTER — Ambulatory Visit: Attending: Nurse Practitioner | Primary: Internal Medicine

## 2021-04-04 ENCOUNTER — Ambulatory Visit: Admit: 2021-04-04 | Discharge: 2021-04-04 | Attending: Nurse Practitioner | Primary: Family Medicine

## 2021-04-04 DIAGNOSIS — R351 Nocturia: Secondary | ICD-10-CM

## 2021-04-04 LAB — AMB POC PVR, MEAS,POST-VOID RES,US,NON-IMAGING
PVR POC: 103 cc
PVR: 103 cc

## 2021-04-04 MED ORDER — SOLIFENACIN 5 MG TAB
5 mg | ORAL_TABLET | Freq: Every day | ORAL | 3 refills | Status: DC
Start: 2021-04-04 — End: 2021-08-30

## 2021-04-04 NOTE — Progress Notes (Signed)
Progress Notes by Fayrene Helper, NP at 04/04/21 1340                Author: Fayrene Helper, NP  Service: --  Author Type: Nurse Practitioner       Filed: 04/04/21 1431  Encounter Date: 04/04/2021  Status: Addendum          Editor: Fayrene Helper, NP (Nurse Practitioner)          Related Notes: Original Note by Fayrene Helper, NP (Nurse Practitioner) filed at 04/04/21  1417                                           ICD-10-CM  ICD-9-CM             1.  Nocturia   R35.1  788.43  AMB POC PVR, MEAS,POST-VOID RES,US,NON-IMAGING            ASSESSMENT:       1.  Bilateral hydroceles     Scrotal ultrasound 08/20/2020-Large bilateral simple appearing hydroceles left greater than right             2.  History of elevated PSA    Status post 2 transrectal prostate biopsies by Dr. Beatriz Chancellor.         3. Nocturia       4. Dementia                     PLAN:     Doing well    Start Vesicare 5 mg daily for bothersome nocturia. SEs discussed. Rx provided    RTC in 4 months for medication reassessment                    Chief Complaint       Patient presents with        ?  Hydrocele     ?  Testicle Swelling        ?  Nocturia           HISTORY OF PRESENT ILLNESS:  Kyle Williams  is a 87 y.o. male who presents  today in follow up for bilateral hydroceles. The patient's son reported he was bathing his father and noted swelling in his testicles.  He was evaluated at the emergency room and underwent a scrotal ultrasound that revealed large bilateral simple appearing  hydroceles left greater than right.  The son reports the swelling has decreased slightly.      Denies gross hematuria and dysuria   Denies f,c,n,v   Good appetite    He is incontinent at times. Wears a diaper daily, but he is usually dry. Only UUI when he gets to the restroom    Reports intermittent abdominal pain    He denies pain to scrotum and penis       History of dementia.  total care per son and daughter       Review of Systems   Constitutional:  Fever: No   Skin: Rash: No   HEENT: Hearing difficulty: No   Eyes: Blurred vision: No   Cardiovascular: Chest pain: No   Respiratory: Shortness of breath: No   Gastrointestinal: Nausea/vomiting: No   Musculoskeletal: Back pain: No   Neurological: Weakness: No   Psychological: Memory loss: No   Comments/additional findings:  Past Medical History:        Diagnosis  Date         ?  Constipation       ?  H/O seasonal allergies       ?  History of BPH       ?  Hypercholesterolemia       ?  Hypertension           ?  Memory loss               Past Surgical History:         Procedure  Laterality  Date          ?  HX HERNIA REPAIR         ?  HX KNEE REPLACEMENT  Left       ?  HX ORTHOPAEDIC              bilateral shoulder replacement          ?  HX TUMOR REMOVAL  Right            shoulder              Social History          Tobacco Use         ?  Smoking status:  Former Smoker     ?  Smokeless tobacco:  Former Network engineer Use Topics         ?  Alcohol use:  No         ?  Drug use:  No           No Known Allergies        Family History         Problem  Relation  Age of Onset          ?  Cancer  Mother                ovarian          ?  Cancer  Father                stomach          ?  Heart Disease  Maternal Grandmother               Current Outpatient Medications          Medication  Sig  Dispense  Refill           ?  finasteride (Proscar) 5 mg tablet  Take 1 Tablet by mouth daily.  90 Tablet  1     ?  levETIRAcetam (KEPPRA) 500 mg tablet  Take 1 Tablet by mouth two (2) times a day.  120 Tablet  1     ?  donepeziL (Aricept) 5 mg tablet  Take 1 Tablet by mouth nightly.  60 Tablet  1     ?  amLODIPine-benazepril (LOTREL) 10-40 mg per capsule  Take 1 Capsule by mouth daily.  90 Capsule  1     ?  atorvastatin (LIPITOR) 40 mg tablet  Take 1 Tablet by mouth daily.  90 Tablet  1     ?  memantine (NAMENDA) 10 mg tablet  take 1 tablet by mouth twice a day  60 Tablet  3     ?  traZODone (DESYREL) 50 mg  tablet  Take 1 Tablet by mouth nightly. (Patient not taking: Reported  on 04/04/2021)  90 Tablet  0     ?  polyethylene glycol (Miralax) 17 gram/dose powder  Take 17 g by mouth daily. 1 tablespoon with 8 oz of water daily  Indications: constipation  595 g  0     ?  peg 400-propylene glycol (SYSTANE, PROPYLENE GLYCOL,) 0.4-0.3 % drop  as needed.               ?  multivitamin (ONE A DAY) tablet  Take 1 Tab by mouth daily.                  PHYSICAL EXAMINATION:    Visit Vitals      Ht  6\' 2"  (1.88 m)     Wt  165 lb (74.8 kg)        BMI  21.18 kg/m??        Constitutional: WDWN, Pleasant and appropriate affect, No acute distress.     CV:  No peripheral swelling noted   Respiratory: No respiratory distress or difficulties   Abdomen:  No abdominal masses or tenderness. No CVA tenderness. No inguinal hernias noted.    GU Male:  04/04/2021   06/04/2021 normal to visual inspection, no erythema or irritation, Sphincter with good tone, Rectum with no hemorrhoids, fissures or masses, Prostate smooth, symmetric and anodular. Prostate is moderate  in size, firm.  Right> left   SCROTUM:  No scrotal rash or lesions noticed.  Normal bilateral testes. Large left hydrocele. Soft.    PENIS: Urethral meatus normal in location and size. No urethral discharge.   Skin: No jaundice.     Neuro/Psych:  Alert to self   Lymphatic:   No enlarged inguinal lymph nodes.           REVIEW OF LABS AND IMAGING:          Results for orders placed or performed during the hospital encounter of 03/07/21     VITAMIN B12         Result  Value  Ref Range            Vitamin B12  962 (H)  211 - 911 pg/mL       TSH 3RD GENERATION         Result  Value  Ref Range            TSH  1.54  0.36 - 3.74 uIU/mL        A copy of today's office visit with all pertinent imaging results and labs were sent to the referring provider, NP Fields-Sykes.           05/07/21, NP-C   [facility]    [address]    [place], [place]    Phone: [phone]     Fax: [phone]      [address] [place], [address]   [place], [place]   P: [phone]    F: [phone]

## 2021-04-05 MED ORDER — FINASTERIDE 5 MG TAB
5 mg | ORAL_TABLET | Freq: Every day | ORAL | 1 refills | Status: DC
Start: 2021-04-05 — End: 2021-08-30

## 2021-04-06 ENCOUNTER — Encounter: Payer: MEDICARE | Attending: Neurology | Primary: Family Medicine

## 2021-04-13 ENCOUNTER — Telehealth: Attending: Family | Primary: Internal Medicine

## 2021-04-13 ENCOUNTER — Telehealth: Admit: 2021-04-13 | Payer: MEDICARE | Attending: Family | Primary: Family Medicine

## 2021-04-13 DIAGNOSIS — U071 COVID-19: Secondary | ICD-10-CM

## 2021-04-13 MED ORDER — ATORVASTATIN 40 MG TAB
40 mg | ORAL_TABLET | Freq: Every day | ORAL | 1 refills | Status: DC
Start: 2021-04-13 — End: 2021-04-13

## 2021-04-13 MED ORDER — BENZONATATE 100 MG CAP
100 mg | ORAL_CAPSULE | Freq: Three times a day (TID) | ORAL | 0 refills | Status: AC | PRN
Start: 2021-04-13 — End: 2021-04-20

## 2021-04-13 MED ORDER — ATORVASTATIN 40 MG TAB
40 mg | ORAL_TABLET | Freq: Every day | ORAL | 1 refills | Status: DC
Start: 2021-04-13 — End: 2021-08-02

## 2021-04-13 NOTE — Progress Notes (Signed)
Kyle Williams is a 87 y.o. male who was seen by synchronous (real-time) audio-video technology on 04/13/2021 for Follow Up Chronic Condition and Medication Refill    Assessment & Plan:   Diagnoses and all orders for this visit:    1. COVID-19    2. Mixed hyperlipidemia  -     atorvastatin (LIPITOR) 40 mg tablet; Take 1 Tablet by mouth daily.    3. Cough  -     benzonatate (TESSALON) 100 mg capsule; Take 1 Capsule by mouth three (3) times daily as needed for Cough for up to 7 days.      Fasting labs ordered  Family needs a letter noting patient's memory loss      Subjective:     The patient presents for an Audio-visual teleconference appointment for routine follow-up care.  Patient has a medical diagnosis for hypertension, hyperlipidemia and memory loss.  Per his caregiver/son and wife his memory has progressively worsening.  They deny any behavioral disturbances but notes he does get frustrated at times.      Hypertension-patient is compliant with taking his medications daily.  He notes he negative for chest pain or palpitation.  He denies any cough, fever, or GI upset.    Patient was diagnosed with COVID-19 on Mar 30, 2020.  His symptoms are improving but he continues to have cough especially at night.  He is negative for any dyspnea or wheezing.  Prior to Admission medications    Medication Sig Start Date End Date Taking? Authorizing Provider   atorvastatin (LIPITOR) 40 mg tablet Take 1 Tablet by mouth daily. 04/13/21  Yes Paula Libra, NP   benzonatate (TESSALON) 100 mg capsule Take 1 Capsule by mouth three (3) times daily as needed for Cough for up to 7 days. 04/13/21 04/20/21 Yes Paula Libra, NP   finasteride (Proscar) 5 mg tablet Take 1 Tablet by mouth daily. 04/05/21  Yes Paula Libra, NP   solifenacin (VESICARE) 5 mg tablet Take 1 Tablet by mouth daily. 04/04/21  Yes Fayrene Helper, NP   levETIRAcetam (KEPPRA) 500 mg tablet Take 1 Tablet by mouth two (2) times a day. 02/02/21  Yes  Otilio Jefferson A, MD   donepeziL (Aricept) 5 mg tablet Take 1 Tablet by mouth nightly. 02/02/21  Yes Otilio Jefferson A, MD   amLODIPine-benazepril (LOTREL) 10-40 mg per capsule Take 1 Capsule by mouth daily. 01/26/21  Yes Paula Libra, NP   memantine [facility]) 10 mg tablet take 1 tablet by mouth twice a day 01/26/21   Paula Libra, NP   traZODone (DESYREL) 50 mg tablet Take 1 Tablet by mouth nightly.  Patient not taking: Reported on 04/13/2021 01/26/21   Paula Libra, NP   atorvastatin (LIPITOR) 40 mg tablet Take 1 Tablet by mouth daily. 01/26/21 04/13/21  Paula Libra, NP   polyethylene glycol (Miralax) 17 gram/dose powder Take 17 g by mouth daily. 1 tablespoon with 8 oz of water daily  Indications: constipation 03/13/20   Ebrom, Pierson, DO   peg 400-propylene glycol (SYSTANE, PROPYLENE GLYCOL,) 0.4-0.3 % drop as needed.    Provider, Historical   multivitamin (ONE A DAY) tablet Take 1 Tab by mouth daily.    Provider, Historical     Patient Active Problem List   Diagnosis Code   ??? Essential hypertension I10   ??? Hyperlipidemia E78.5   ??? Memory loss R41.3   ??? Benign prostatic hyperplasia N40.0   ??? Seasonal allergic rhinitis J30.2   ???  Constipation K59.00   ??? Dementia without behavioral disturbance (HCC) F03.90     Patient Active Problem List    Diagnosis Date Noted   ??? Dementia without behavioral disturbance (HCC) 11/05/2019   ??? Essential hypertension 05/06/2017   ??? Hyperlipidemia 05/06/2017   ??? Memory loss 05/06/2017   ??? Benign prostatic hyperplasia 05/06/2017   ??? Seasonal allergic rhinitis 05/06/2017   ??? Constipation 05/06/2017     Current Outpatient Medications   Medication Sig Dispense Refill   ??? atorvastatin (LIPITOR) 40 mg tablet Take 1 Tablet by mouth daily. 90 Tablet 1   ??? benzonatate (TESSALON) 100 mg capsule Take 1 Capsule by mouth three (3) times daily as needed for Cough for up to 7 days. 21 Capsule 0   ??? finasteride (Proscar) 5 mg tablet Take 1 Tablet by mouth daily. 90 Tablet 1   ???  solifenacin (VESICARE) 5 mg tablet Take 1 Tablet by mouth daily. 90 Tablet 3   ??? levETIRAcetam (KEPPRA) 500 mg tablet Take 1 Tablet by mouth two (2) times a day. 120 Tablet 1   ??? donepeziL (Aricept) 5 mg tablet Take 1 Tablet by mouth nightly. 60 Tablet 1   ??? amLODIPine-benazepril (LOTREL) 10-40 mg per capsule Take 1 Capsule by mouth daily. 90 Capsule 1   ??? memantine (NAMENDA) 10 mg tablet take 1 tablet by mouth twice a day 60 Tablet 3   ??? traZODone (DESYREL) 50 mg tablet Take 1 Tablet by mouth nightly. (Patient not taking: Reported on 04/13/2021) 90 Tablet 0   ??? polyethylene glycol (Miralax) 17 gram/dose powder Take 17 g by mouth daily. 1 tablespoon with 8 oz of water daily  Indications: constipation 595 g 0   ??? peg 400-propylene glycol (SYSTANE, PROPYLENE GLYCOL,) 0.4-0.3 % drop as needed.     ??? multivitamin (ONE A DAY) tablet Take 1 Tab by mouth daily.       No Known Allergies  Past Medical History:   Diagnosis Date   ??? Constipation    ??? H/O seasonal allergies    ??? History of BPH    ??? Hypercholesterolemia    ??? Hypertension    ??? Memory loss        Review of Systems   Constitutional: Negative for malaise/fatigue.   Respiratory: Negative for cough and shortness of breath.    Cardiovascular: Negative for chest pain, palpitations and leg swelling.   Gastrointestinal: Negative for abdominal pain, nausea and vomiting.   Genitourinary: Negative for dysuria.   Musculoskeletal: Negative for back pain and myalgias.   Neurological: Negative for dizziness and headaches.   Psychiatric/Behavioral: Positive for memory loss.       Constitutional: No apparent distress noted  General- negative for fever, chills or fatigue  Eyes- negative visual changes  CV- denies chest pain, palpitation  Pul: negative cough or SOB  GI: negative nausea, flank pain, diarrhea, constipation  Urinary:- No dysuria or polyuria  MS- negative myalgia, negative joint pain  Neuro- negative headache, dizziness or weakness  Skin- negative for rashes or  lesions.  Psych- dementia    Objective:     Patient-Reported Vitals 11/05/2019   Patient-Reported Weight 172 lb   Patient-Reported Pulse 67   Patient-Reported Temperature 97.7   Patient-Reported Systolic  140   Patient-Reported Diastolic 91        [INSTRUCTIONS:  "[x] " Indicates a positive item  "[] " Indicates a negative item  -- DELETE ALL ITEMS NOT EXAMINED]    Constitutional: [x]  Appears well-developed and well-nourished [x]   No apparent distress      []  Abnormal -     Mental status: [x]  Alert and awake  [x]  Oriented to person/place/time [x]  Able to follow commands    []  Abnormal -     Eyes:   EOM    [x]   Normal    []  Abnormal -   Sclera  [x]   Normal    []  Abnormal -          Discharge [x]   None visible   []  Abnormal -     HENT: [x]  Normocephalic, atraumatic  []  Abnormal -   [x]  Mouth/Throat: Mucous membranes are moist    External Ears [x]  Normal  []  Abnormal -    Neck: [x]  No visualized mass []  Abnormal -     Pulmonary/Chest: [x]  Respiratory effort normal   [x]  No visualized signs of difficulty breathing or respiratory distress        []  Abnormal -      Musculoskeletal:   [x]  Normal gait with no signs of ataxia         [x]  Normal range of motion of neck        []  Abnormal -     Neurological:        [x]  No Facial Asymmetry (Cranial nerve 7 motor function) (limited exam due to video visit)          [x]  No gaze palsy        []  Abnormal -          Skin:        [x]  No significant exanthematous lesions or discoloration noted on facial skin         []  Abnormal -            Psychiatric:       [x]  Normal Affect []  Abnormal -        [x]  No Hallucinations    Other pertinent observable physical exam findings:-        We discussed the expected course, resolution and complications of the diagnosis(es) in detail.  Medication risks, benefits, costs, interactions, and alternatives were discussed as indicated.  I advised him to contact the office if his condition worsens, changes or fails to improve as anticipated. He expressed  understanding with the diagnosis(es) and plan.       , who was evaluated through a patient-initiated, synchronous (real-time) audio-video encounter, and/or his healthcare decision maker, is aware that it is a billable service, with coverage as determined by his insurance carrier. He provided verbal consent to proceed: Yes, and patient identification was verified. It was conducted pursuant to the emergency declaration under the and the , 1135 waiver authority and the and Act. A caregiver was present when appropriate. Ability to conduct physical exam was limited. I was at home. The patient was at home.      , NP

## 2021-04-20 ENCOUNTER — Encounter: Payer: MEDICARE | Attending: Acute Care | Primary: Family Medicine

## 2021-05-10 ENCOUNTER — Ambulatory Visit
Payer: TRICARE (CHAMPUS) | Attending: Student in an Organized Health Care Education/Training Program | Primary: Family Medicine

## 2021-05-17 ENCOUNTER — Encounter: Payer: MEDICARE | Attending: Acute Care | Primary: Family Medicine

## 2021-06-28 ENCOUNTER — Ambulatory Visit: Attending: Acute Care | Primary: Internal Medicine

## 2021-06-28 ENCOUNTER — Ambulatory Visit: Admit: 2021-06-28 | Discharge: 2021-06-28 | Payer: MEDICARE | Attending: Acute Care | Primary: Family Medicine

## 2021-06-28 DIAGNOSIS — F0390 Unspecified dementia without behavioral disturbance: Secondary | ICD-10-CM

## 2021-06-28 MED ORDER — MEMANTINE 5 MG TAB
5 mg | ORAL_TABLET | ORAL | 5 refills | Status: DC
Start: 2021-06-28 — End: 2021-08-30

## 2021-06-28 MED ORDER — LEVETIRACETAM 750 MG TAB
750 mg | ORAL_TABLET | Freq: Two times a day (BID) | ORAL | 5 refills | Status: DC
Start: 2021-06-28 — End: 2021-08-02

## 2021-06-28 NOTE — Progress Notes (Signed)
[facility]  [address], [address], [place], [place] [address]  [address] [place]. [address], [place], [place] [address]  Office:  [phone]  Fax: [phone]  Chief Complaint   Patient presents with    Follow-up       HPI: Kyle Williams presents in follow-up.  He was last seen here on 02/02/2021 in initial evaluation for memory loss.  History per note is as follows.  He has had progressive memory loss which started 5 years ago.  It was noted by his daughter that his short-term memory started getting worse and he has had difficulty taking care of his bills, decisions,, and was getting lost.  He was on medication for memory but it did not help.  He recently had a seizure on February 21 when his daughter noted screaming, stiffness, his eyes rolled back, and he was confused.  It lasted 1 to 2 minutes and he was taken to the ED and referred to neurology for further recommendations.  He was not able to provide history and has difficulty following commands.  Limited speech.  He denies any pain.  No new events reported.  There was concern about his swallowing.  He has trouble sleeping and was started on trazodone which may have helped.  He has had nightmares in the past.  He was not started on antiepileptic.  It was noted that he had dehydration.  The impression was Alzheimer's dementia.  He was recommended to start donepezil.  Vascular risk factor management.  PT evaluation.  Check vitamin B12 and TSH.  MRI brain in the past was consistent with atrophy and hippocampal area.  He was started on Keppra 500 mg twice daily for seizure which could have been provoked by dehydration but can be contributing to his behavior and worsening memory.  TSH in April 2022 was normal.  Vitamin B12 level was slightly elevated.    MRI BRAIN 05/22/2020 impression: "1.  Atrophy pattern fits with the diagnosis of Alzheimer's disease  There is however significant contribution small vessel disease and a multi-infarct dementia  component may also be present. No acute infarct or bleed is seen today's exam"    EEG in April 2022 reported grossly abnormal EEG with the presence of slowing and a few epileptiform discharges.    He presents today in follow-up.  He is with his daughter [name] who provides history.  He lives with his son.  The patient and his wife moved here 4 years ago for help.  They had lived in [place].  They are [employer].  His wife is blind and also has dementia.  She reports her father also had another seizure last month.  She says her brother laid him on the floor.  He stiffens up and arches and falls asleep.  She reports he screamed out, tightened up, stiffened up at that time and collapses and started snoring.  They were told he was dehydrated at the ER.  She now gives a supplement liquid IV with his oral fluids to help for hydration and give juice with breakfast.  The last seizure was his second seizure ever that they know of.  No incontinence or tongue biting.  He has extreme sleepiness after.  It is usually first thing in the morning after going to the restroom then going back to bed.  Then he sleeps till 2 PM after the seizure.  He sleeps a lot.  Sometimes he starts his day at 9 AM, sometimes sleeps in  later.  No snoring anymore.  He sleeps on his side.  Not taking trazodone for sleep lately.  Takes melatonin chews which does better.  Taking pumpkin seed oil for urinary issues, had been getting up several times at night due to prostate and it helped.  She believes he is taking the Keppra 500 mg twice daily.  His son gives the medications.  He requires help for ADLs.  They shower him.  They lead him to the bathroom.  He lost his hearing aids and he will not wear his glasses.  He cannot dress himself but can undress himself.  They clean him and put him to bed.  He will not do the puzzles anymore.  He used to color but will not do it anymore.  He used to play with Legos with his grandkids but has not  been doing it.  He will not do much.  Reports he does repetitive behavior like opening and closing drawers and will put his shoes in there.  He will tear buttons off clothes.  Sometimes can be irritable/aggressive.  The family is social and he is always with them.  He enjoys the kids.  He likes to eat, has a good appetite.  Some problem with pills so they crush it and put in applesauce.  Endorses weight loss.  Sometimes he talks like there is someone there, this is around bedtime, so she wonders whether he is hallucinating.  Denies signs or symptoms of infection.  He had COVID in May.  She believes he started the donepezil.  Denies diarrhea.  He has constipation.  He does not try to wander.  He does not speak much.  In the past he was constantly doing something- was in the [employer], had a side business.  Family history includes: His sister passed away, had PD.  His other sister died in her 36s.  His father died in his 33s.  All had cancer.  His mother and sisters had dementia.  His daughter is going to obtain POA.  They could not get PT.  He had an EKG in February 2022 which showed sinus rhythm with first-degree AV block and in 2019 with first-degree AV block and occasional PACs.    Past Medical History:   Diagnosis Date    Constipation     H/O seasonal allergies     History of BPH     Hypercholesterolemia     Hypertension     Memory loss        Past Surgical History:   Procedure Laterality Date    HX HERNIA REPAIR      HX KNEE REPLACEMENT Left     HX ORTHOPAEDIC      bilateral shoulder replacement    HX TUMOR REMOVAL Right     shoulder        Current Outpatient Medications   Medication Sig Dispense Refill    MELATONIN PO Take  by mouth.      pumpkin seed/soy germ/Cissus (PUMPKIN SEED-SOYBEAN OIL-CISS PO) Take  by mouth.      levETIRAcetam (KEPPRA) 750 mg tablet Take 1 Tablet by mouth two (2) times a day. 60 Tablet 5    memantine (NAMENDA) 5 mg tablet Take 1 tab by mouth nightly for 1 week, then 1 tab twice daily  thereafter. 60 Tablet 5    atorvastatin (LIPITOR) 40 mg tablet Take 1 Tablet by mouth daily. 90 Tablet 1    donepeziL (Aricept) 5 mg tablet Take 1 Tablet by  mouth nightly. 60 Tablet 1    amLODIPine-benazepril (LOTREL) 10-40 mg per capsule Take 1 Capsule by mouth daily. 90 Capsule 1    finasteride (Proscar) 5 mg tablet Take 1 Tablet by mouth daily. (Patient not taking: Reported on 06/28/2021) 90 Tablet 1    solifenacin (VESICARE) 5 mg tablet Take 1 Tablet by mouth daily. 90 Tablet 3    traZODone (DESYREL) 50 mg tablet Take 1 Tablet by mouth nightly. (Patient not taking: No sig reported) 90 Tablet 0        No Known Allergies    Social History     Tobacco Use    Smoking status: Former    Smokeless tobacco: Former   Substance Use Topics    Alcohol use: No    Drug use: No       Family History   Problem Relation Age of Onset    Cancer Mother         ovarian    Cancer Father         stomach    Heart Disease Maternal Grandmother        Review of Systems:  Cannot obtain ROS due to medical condition but he denies any specific complaint.    Physical Examination:  Visit Vitals  BP 102/70 (BP 1 Location: Left upper arm, BP Patient Position: Sitting, BP Cuff Size: Adult)   Pulse 70   Resp 16   Ht 6\' 2"  (1.88 m)   Wt 74 kg (163 lb 3.2 oz)   SpO2 97%   BMI 20.95 kg/m??       Alert, in NAD. Heart is regular. Oriented x3.  He does not speak much.  His daughter provided history.  He is not able to participate in much exam.  He did not state his first name.  Minimal speech.  He answered thank you appropriately.  Difficulty with command following.  EOMs are full, PERRL, no nystagmus.  Blinks to threat bilaterally.  No facial asymmetry.  Did not protrude tongue.  Moves all extremities antigravity.  No abnormal movements.  Gait is slow, wide-based.  Ambulated without assistive device.      Impression/Plan:  This is an 87 year old male who presents in follow-up for Alzheimer's dementia.  He was initially seen here in March.  He has had  dementia progressively worsening.  He is now on donepezil 5 mg daily.  He had 1 recurrent seizure after the initial seizure in February.  We will increase the Keppra to 750 mg twice daily.  Obtain labs including CMP and magnesium.  He has impaired renal function.  We will continue the Aricept without adjustment.  Start memantine 5 mg at night for 1 week then 5 mg twice daily.  Encouraged nutritional supplement.  Daughter is planning to obtain guardianship.  Advised to let me know when interested in physical therapy and/or speech therapy.  We will also consider sleep study.  Follow up in 2 months.    Diagnoses and all orders for this visit:    1. Dementia without behavioral disturbance, unspecified dementia type (HCC)  -     METABOLIC PANEL, COMPREHENSIVE; Future  -     MAGNESIUM; Future    2. Seizure (HCC)  -     METABOLIC PANEL, COMPREHENSIVE; Future  -     MAGNESIUM; Future    Other orders  -     levETIRAcetam (KEPPRA) 750 mg tablet; Take 1 Tablet by mouth two (2) times a day.  -  memantine (NAMENDA) 5 mg tablet; Take 1 tab by mouth nightly for 1 week, then 1 tab twice daily thereafter.    Total time 40 minutes with 25 minutes spent in counseling.     Signed By: Maurine Simmering, NP        PLEASE NOTE:   Portions of this document may have been produced using voice recognition software. Unrecognized errors in transcription may be present.

## 2021-08-02 ENCOUNTER — Encounter

## 2021-08-02 MED ORDER — LEVETIRACETAM 750 MG TAB
750 mg | ORAL_TABLET | Freq: Two times a day (BID) | ORAL | 5 refills | Status: AC
Start: 2021-08-02 — End: 2021-08-30

## 2021-08-02 MED ORDER — ATORVASTATIN 40 MG TAB
40 mg | ORAL_TABLET | Freq: Every day | ORAL | 1 refills | Status: AC
Start: 2021-08-02 — End: ?

## 2021-08-02 MED ORDER — DONEPEZIL 5 MG TAB
5 mg | ORAL_TABLET | Freq: Every evening | ORAL | 1 refills | Status: DC
Start: 2021-08-02 — End: 2021-08-30

## 2021-08-02 NOTE — Telephone Encounter (Signed)
This patient contacted office for the following prescriptions to be filled:    Last office visit: 04/13/21  Follow up appointment: 09/19/21  Medication requested :   Requested Prescriptions     Pending Prescriptions Disp Refills    donepeziL (Aricept) 5 mg tablet 60 Tablet 1     Sig: Take 1 Tablet by mouth nightly.    levETIRAcetam (KEPPRA) 750 mg tablet 60 Tablet 5     Sig: Take 1 Tablet by mouth two (2) times a day.    atorvastatin (LIPITOR) 40 mg tablet 90 Tablet 1     Sig: Take 1 Tablet by mouth daily.       PCP: Silvio Pate  Mail order or Local pharmacy name [facility]

## 2021-08-11 ENCOUNTER — Encounter: Attending: Nurse Practitioner | Primary: Family Medicine

## 2021-08-30 ENCOUNTER — Ambulatory Visit: Attending: Acute Care | Primary: Internal Medicine

## 2021-08-30 ENCOUNTER — Ambulatory Visit: Admit: 2021-08-30 | Discharge: 2021-08-30 | Payer: MEDICARE | Attending: Acute Care | Primary: Family Medicine

## 2021-08-30 DIAGNOSIS — F0390 Unspecified dementia without behavioral disturbance: Secondary | ICD-10-CM

## 2021-08-30 MED ORDER — LEVETIRACETAM 750 MG TAB
750 mg | ORAL_TABLET | Freq: Two times a day (BID) | ORAL | 1 refills | Status: AC
Start: 2021-08-30 — End: ?

## 2021-08-30 MED ORDER — MEMANTINE 5 MG TAB
5 mg | ORAL_TABLET | ORAL | 1 refills | Status: AC
Start: 2021-08-30 — End: ?

## 2021-08-30 MED ORDER — DONEPEZIL 5 MG TAB
5 mg | ORAL_TABLET | Freq: Every evening | ORAL | 1 refills | Status: AC
Start: 2021-08-30 — End: ?

## 2021-08-30 NOTE — Progress Notes (Addendum)
Progress Notes by Maurine Simmering, NP at 08/30/21 1430                Author: Maurine Simmering, NP  Service: --  Author Type: Nurse Practitioner       Filed: 09/03/21 2101  Encounter Date: 08/30/2021  Status: Addendum          Editor: Maurine Simmering, NP (Nurse Practitioner)          Related Notes: Original Note by Maurine Simmering, NP (Nurse Practitioner) filed at 09/03/21 2100               [facility]   [address], [place], [place] [address]   Office:  [phone]  Fax:  [phone]     Chief Complaint       Patient presents with        ?  Follow-up           HPI: Kyle Williams presents in follow-up for memory loss.  He was seen here on 02/02/2021 in initial evaluation for memory loss.  History per note is as follows.  He has had progressive memory loss which started 5 years ago.  It was noted by his  daughter that his short-term memory started getting worse and he has had difficulty taking care of his bills, decisions, and was getting lost.  He was on medication for memory but it did not help.  He recently had a seizure on February 21 when his daughter  noted screaming, stiffness, his eyes rolled back, and he was confused.  It lasted 1 to 2 minutes and he was taken to the ED and referred to neurology for further recommendations.  He was not able to provide history and has difficulty following commands.   Limited speech.  He denies any pain.  No new events reported.  There was concern about his swallowing.  He has trouble sleeping and was started on trazodone which may have helped.  He has had nightmares in the past.  He was not started on antiepileptic.   It was noted that he had dehydration.  The impression was Alzheimer's dementia.  He was started on donepezil.  Vascular risk factor management.  PT evaluation.  MRI brain in the past was consistent with atrophy and hippocampal area.  He was started  on Keppra 500 mg twice daily for seizure which could have been provoked  by dehydration but can be contributing to his behavior and worsening memory.  TSH in April 2022 was normal.  Vitamin B12 level was slightly elevated.   EEG in April 2022 reported grossly abnormal EEG with the presence of slowing and a few epileptiform discharges.  MRI brain from 05/22/2020 reported atrophy pattern fits with the diagnosis of Alzheimer's  disease, and also significant contribution small vessel disease and a multi-infarct dementia component may also be present.  He was last seen here on 06/28/2021.  He is with his daughter  [name].  He lives with his son.  The patient and his wife moved here 4 years ago from [facility] for help.  His wife is blind and has dementia.  At those last visit it was noted that he had 1 more seizure in the interim.  He stiffens  up and arches and falls asleep.  She reported that he screams out, tightened up, stiffened up at that time and collapses and falls asleep.  They were told he was dehydrated in the ER.  He only had the 2 seizures.  He was taking the Keppra.  His medications.   He requires help for all ADLs.  He was doing activities like puzzles or coloring less.  He does repetitive behaviors like opening and closing draws.  Sometimes can be irritable/aggressive.  He is around his family and the kids.  Eating well.  He takes  pills crushed.  Endorsed weight loss.  Some talking at night like somebody is there, possibly hallucinating.  Denied signs or symptoms of infection.  He had COVID in May.  She believes he started the donepezil.  No wandering.  He does not speak much.   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  The Keppra was increased to 750 mg twice daily at the last visit.  The Aricept was continued without adjustment.  Memantine 5 mg nightly for 1 week was started then 5 mg twice daily.  Encouraged  nutritional supplement.  Daughter was planning to obtain guardianship.  He had an EKG in February 2022 which showed sinus rhythm with  first-degree AV block and in 2019 with first-degree AV block and  occasional PACs.      He presents today in follow-up.  He is with his daughter who provides history.  She reports he is doing about the same.  Continues Keppra 750 mg twice daily.  Some sleepiness, but about the same.  She reports her brother says he sleeps till 25 or 1 in  the afternoon.  He goes to bed at 930 or 10.  He gets up to go to the bathroom.  Sometimes has broken sleep.  No more seizures.  He did not have labs yet.  Reports he eats fine.  He eats his meals.  He has Ensure between meals and has the IV hydration  multiplier that he likes.  His son helps him for ADLs.  She believes he started the memantine.  He is off trazodone; it seemed to accelerate him in the daytime and was wandering around in the daytime.  Sometimes he does not want to do things like get  in the car.  Some yelling, gets rigid.  No violence.  He feels okay with walking.  She does not think so, wonders about a 4 pronged cane.  He lost his hearing aids from taking them in and out.  He picks at his skin on his legs.  He has supervision with  his son there 24/7 and an alarm system in his room.        Past Medical History:        Diagnosis  Date         ?  Constipation       ?  H/O seasonal allergies       ?  History of BPH       ?  Hypercholesterolemia       ?  Hypertension           ?  Memory loss               Past Surgical History:         Procedure  Laterality  Date          ?  HX HERNIA REPAIR         ?  HX KNEE REPLACEMENT  Left       ?  HX ORTHOPAEDIC              bilateral shoulder  replacement          ?  HX TUMOR REMOVAL  Right            shoulder              Current Outpatient Medications          Medication  Sig  Dispense  Refill           ?  memantine (NAMENDA) 5 mg tablet  Take 1 tab by mouth nightly for 1 week, then 1 tab twice daily thereafter.  Indications: moderate to severe Alzheimer's type dementia  180 Tablet  1     ?  donepeziL (Aricept) 5 mg tablet   Take 1 Tablet by mouth nightly.  90 Tablet  1     ?  levETIRAcetam (KEPPRA) 750 mg tablet  Take 1 Tablet by mouth two (2) times a day.  180 Tablet  1     ?  atorvastatin (LIPITOR) 40 mg tablet  Take 1 Tablet by mouth daily.  90 Tablet  1     ?  MELATONIN PO  Take  by mouth.         ?  pumpkin seed/soy germ/Cissus (PUMPKIN SEED-SOYBEAN OIL-CISS PO)  Take  by mouth.               ?  amLODIPine-benazepril (LOTREL) 10-40 mg per capsule  Take 1 Capsule by mouth daily.  90 Capsule  1            No Known Allergies        Social History          Tobacco Use         ?  Smoking status:  Former     ?  Smokeless tobacco:  Former       Substance Use Topics         ?  Alcohol use:  No         ?  Drug use:  No             Family History         Problem  Relation  Age of Onset          ?  Cancer  Mother                ovarian          ?  Cancer  Father                stomach          ?  Heart Disease  Maternal Grandmother             Review of Systems:   Cannot obtain ROS due to medical condition but he denies any specific complaint.      Physical Examination:   Visit Vitals      BP  130/80     Pulse  (!) 47     Resp  18     Ht  6\' 2"  (1.88 m)     Wt  73 kg (161 lb)     SpO2  98%        BMI  20.67 kg/m??        Recheck radial pulse: 58      Alert, in NAD. He stands up and walks in the room a few times during the visit, reaching for things on the counter such as the tissues and BP cuff.  He  does not speak much.  His daughter provided history.  He did not answer exam questions.  He answered  a few yes or no questions but unreliably.  Difficultly with command following.  EOMs are full.  No nystagmus.  No facial asymmetry.  Did not protrude tongue.  Moves all extremities antigravity.  No abnormal movements.  Gait is slow, wide-based.  Ambulated  without assistive device.         Impression/Plan:   This is an 87 year old right-handed male who presents in follow-up for Alzheimer's dementia.  He has had dementia progressively worsening.   He is on donepezil 5 mg daily.  She believes he started the Namenda.  He lives with his son who helps him for all  ADLs and assists with IADLs.  He helps him with medications.  He has 24/7 care at home.  Continue current regimen of Aricept 5 mg.  Continue Namenda.  5 mg nightly for 1 week if he has not been taking this, then 5 mg twice daily.  He is on Keppra 750  mg twice daily.  Denies further seizures.  Provided handout on nonpharmacologic recommendations for dementia.  Some picking behaviors noted; recommended nonpharmacologic measures such as hydrating skin with lotion and keeping hands clean and nails short.   Encouraged nutritional supplements.  We will plan to check CMP and magnesium.  Can have labs later in the month when he sees PCP, or after.  Follow up in 3 to 4 months.      Diagnoses and all orders for this visit:      1. Dementia without behavioral disturbance, unspecified dementia type (HCC)      2. Seizure (HCC)      Other orders   -     memantine (NAMENDA) 5 mg tablet; Take 1 tab by mouth nightly for 1 week, then 1 tab twice daily thereafter.  Indications: moderate to severe Alzheimer's type dementia   -     donepeziL (Aricept) 5 mg tablet; Take 1 Tablet by mouth nightly.   -     levETIRAcetam (KEPPRA) 750 mg tablet; Take 1 Tablet by mouth two (2) times a day.      Total time 29 minutes with 15 minutes spent in counseling.          Signed By:  Maurine Simmering, NP            PLEASE NOTE:    Portions of this document may have been produced using voice recognition software. Unrecognized errors in transcription may be present.

## 2021-09-19 ENCOUNTER — Ambulatory Visit: Attending: Internal Medicine | Primary: Internal Medicine

## 2021-09-19 ENCOUNTER — Ambulatory Visit: Attending: Nurse Practitioner | Primary: Internal Medicine

## 2021-09-19 ENCOUNTER — Ambulatory Visit: Payer: MEDICARE | Attending: Internal Medicine | Primary: Family Medicine

## 2021-09-19 DIAGNOSIS — E782 Mixed hyperlipidemia: Secondary | ICD-10-CM

## 2021-09-19 DIAGNOSIS — R351 Nocturia: Secondary | ICD-10-CM

## 2021-09-19 MED ORDER — POLYETHYLENE GLYCOL 3350 17 GRAM (100 %) ORAL POWDER PACKET
17 gram | Freq: Every day | ORAL | 2 refills | Status: AC
Start: 2021-09-19 — End: ?

## 2021-09-19 MED ORDER — AMLODIPINE-BENAZEPRIL 10 MG-40 MG CAP
10-40 mg | ORAL_CAPSULE | Freq: Every day | ORAL | 1 refills | Status: AC
Start: 2021-09-19 — End: ?

## 2021-09-19 NOTE — Progress Notes (Signed)
Kyle Williams is a 87 y.o. male presenting today for Establish Care (Memory, sight and hearing. Constipation from medication. Would like to discuss getting a walker. )  .     Chief Complaint   Patient presents with    Establish Care     Memory, sight and hearing. Constipation from medication. Would like to discuss getting a walker.        HPI:  Kyle Williams presents to the office today to establish care.     Patient has a PMHx of dementia, seizures, HLD, HTN.    Patient is present with his daughter.    Seizures: Patient is on Keppra.    Dementia: He is on Namenda.  Following with neurology. Patient has limited communication and is unable to recognize his family.  He is not oriented to person, place or time.  His son is the primary care taker.   He recently was admitted to the hospital due to dehydration and has history of constipation.  Son and daughter have been giving him prune juice and Metamucil daily.    HTN: Prescribed amlodipine and benazepril.    HLD: Prescribed Lipitor 40 mg daily.  Lipid panel in 1/21 showed LDL 162, HDL 65, triglyceride 103.    Patient's daughter reports that patient has been having gait instability.  No recent falls.    Review of Systems   Unable to perform ROS: Dementia     No Known Allergies    PHQ Screening   3 most recent PHQ Screens 09/19/2021   Little interest or pleasure in doing things Not at all   Feeling down, depressed, irritable, or hopeless Not at all   Total Score PHQ 2 0   Trouble falling or staying asleep, or sleeping too much -   Feeling tired or having little energy -   Poor appetite, weight loss, or overeating -   Feeling bad about yourself - or that you are a failure or have let yourself or your family down -   Trouble concentrating on things such as school, work, reading, or watching TV -   Moving or speaking so slowly that other people could have noticed; or the opposite being so fidgety that others notice -   Thoughts of being better off dead, or hurting  yourself in some way -   PHQ 9 Score -   How difficult have these problems made it for you to do your work, take care of your home and get along with others -       History  Past Medical History:   Diagnosis Date    Constipation     H/O seasonal allergies     History of BPH     Hypercholesterolemia     Hypertension     Memory loss        Past Surgical History:   Procedure Laterality Date    HX HERNIA REPAIR      HX KNEE REPLACEMENT Left     HX ORTHOPAEDIC      bilateral shoulder replacement    HX TUMOR REMOVAL Right     shoulder        Social History     Socioeconomic History    Marital status: MARRIED     Spouse name: Not on file    Number of children: Not on file    Years of education: Not on file    Highest education level: Not on file   Occupational History    Not  on file   Tobacco Use    Smoking status: Former    Smokeless tobacco: Former   Substance and Sexual Activity    Alcohol use: No    Drug use: No    Sexual activity: Not Currently     Partners: Female   Other Topics Concern    Not on file   Social History Narrative    Not on file     Social Determinants of Health     Financial Resource Strain: Not on file   Food Insecurity: Not on file   Transportation Needs: Not on file   Physical Activity: Not on file   Stress: Not on file   Social Connections: Not on file   Intimate Partner Violence: Not on file   Housing Stability: Not on file       Current Outpatient Medications   Medication Sig Dispense Refill    amLODIPine-benazepril (LOTREL) 10-40 mg per capsule Take 1 Capsule by mouth daily. 90 Capsule 1    polyethylene glycol (MIRALAX) 17 gram packet Take 1 Packet by mouth daily. Indications: constipation 30 Each 2    memantine (NAMENDA) 5 mg tablet Take 1 tab by mouth nightly for 1 week, then 1 tab twice daily thereafter.  Indications: moderate to severe Alzheimer's type dementia 180 Tablet 1    donepeziL (Aricept) 5 mg tablet Take 1 Tablet by mouth nightly. 90 Tablet 1    levETIRAcetam (KEPPRA) 750 mg tablet  Take 1 Tablet by mouth two (2) times a day. 180 Tablet 1    atorvastatin (LIPITOR) 40 mg tablet Take 1 Tablet by mouth daily. 90 Tablet 1    MELATONIN PO Take  by mouth.      pumpkin seed/soy germ/Cissus (PUMPKIN SEED-SOYBEAN OIL-CISS PO) Take  by mouth.           Vitals:    09/19/21 1340   BP: 130/82   Resp: 14   Height: 6\' 2"  (1.88 m)   PainSc:   0 - No pain       Physical Exam  Vitals and nursing note reviewed.   Constitutional:       General: He is not in acute distress.     Appearance: Normal appearance. He is not ill-appearing, toxic-appearing or diaphoretic.   HENT:      Head: Normocephalic and atraumatic.      Ears:      Comments: Hearing difficulty  Eyes:      General: No scleral icterus.     Extraocular Movements: Extraocular movements intact.      Conjunctiva/sclera: Conjunctivae normal.      Pupils: Pupils are equal, round, and reactive to light.   Cardiovascular:      Rate and Rhythm: Normal rate and regular rhythm.      Pulses: Normal pulses.      Heart sounds: Normal heart sounds. No murmur heard.  Pulmonary:      Effort: Pulmonary effort is normal. No respiratory distress.      Breath sounds: Normal breath sounds. No wheezing or rales.   Musculoskeletal:         General: No swelling or tenderness. Normal range of motion.      Cervical back: Normal range of motion and neck supple.      Right lower leg: No edema.      Left lower leg: No edema.   Skin:     General: Skin is warm and dry.      Coloration: Skin is not  jaundiced or pale.   Neurological:      Mental Status: He is alert. He is disoriented.      Comments: A.O X 0  Not following commands       No visits with results within 3 Month(s) from this visit.   Latest known visit with results is:   Office Visit on 04/04/2021   Component Date Value Ref Range Status    PVR 04/04/2021 103  cc Final       No results found for any visits on 09/19/21.    Patient Care Team:  Patient Care Team:  Cleon Gustin, MD as PCP - General (Internal Medicine  Physician)  Cherrie Gauze, MD as Physician (Urology)  Vernie Murders, MD as Consulting Provider (Neurology)      Assessment / Plan:      ICD-10-CM ICD-9-CM    1. Mixed hyperlipidemia  E78.2 272.2 LIPID PANEL      METABOLIC PANEL, COMPREHENSIVE      2. Essential hypertension  I10 401.9 amLODIPine-benazepril (LOTREL) 10-40 mg per capsule      CBC WITH AUTOMATED DIFF      METABOLIC PANEL, COMPREHENSIVE      3. Alzheimer's dementia without behavioral disturbance, unspecified timing of dementia onset (HCC)  G30.9 331.0 AMB SUPPLY ORDER    F02.80 294.10       4. Gait instability  R26.81 781.2 AMB SUPPLY ORDER      5. Encounter for medical examination to establish care  Z00.00 V70.9       6. Constipation, unspecified constipation type  K59.00 564.00 polyethylene glycol (MIRALAX) 17 gram packet      7. Needs flu shot  Z23 V04.81 INFLUENZA, FLUAD, (AGE 56 Y+), IM, PF, 0.5 ML        History of seizures and dementia: Following with neurology.    HTN: BP at goal.  Continue amlodipine and benazepril.  Check CMP.    HLD: Check lipid panel.  Continue Lipitor.    Constipation: Advised to increase fluid intake.  Continue Metamucil.  Can take MiraLAX as needed.    Received flu shot today.    Follow-up and Dispositions    Return in about 5 months (around 02/17/2022) for Medicare Wellness, 30 mins.         I asked the patient if he  had any questions and answered his  questions.  The patient stated that he understands the treatment plan and agrees with the treatment plan    This document was created with a voice activated dictation system and may contain transcription errors.

## 2021-09-19 NOTE — Progress Notes (Signed)
Progress Notes by Fayrene Helper, NP at 09/19/21 1440                Author: Fayrene Helper, NP  Service: --  Author Type: Nurse Practitioner       Filed: 09/19/21 1547  Encounter Date: 09/19/2021  Status: Signed          Editor: Fayrene Helper, NP (Nurse Practitioner)                                           ICD-10-CM  ICD-9-CM             1.  Nocturia   R35.1  788.43                    2.  Hydrocele in adult   N43.3  603.9                        ASSESSMENT:       1.  Bilateral hydroceles     Scrotal ultrasound 08/20/2020-Large bilateral simple appearing hydroceles left greater than right             2.  History of elevated PSA    Status post 2 transrectal prostate biopsies by Dr. Beatriz Chancellor.         3. Nocturia       4. Dementia                    PLAN:     ??  Doing well    ??  Discontinue Vesicare r/t not beneficial and constipation    ??  RTC prn                   Chief Complaint       Patient presents with        ?  Nocturia        ?  Hydrocele              HISTORY OF PRESENT ILLNESS:  Kyle Williams  is a 87 y.o. male who presents  today in follow up for bilateral hydroceles. The patient's son reported he was bathing his father and noted swelling in his testicles.  He was evaluated at the emergency room and underwent a scrotal ultrasound that revealed large bilateral simple appearing  hydroceles left greater than right.  The son reports the swelling has decreased slightly.      The patient's daughter reports Vesicare caused constipation and not beneficial    They stopped it.    He was started on pumpkin oil with benefit.    Nocturia x1-2 at most.    Denies incontinence.    The patient becomes agitated when he has to void   Denies gross hematuria   He denies pain to scrotum and penis       History of dementia.  total care per son and daughter       Review of Systems   Constitutional: Fever:    Skin: Rash:    HEENT: Hearing difficulty:    Eyes: Blurred vision:    Cardiovascular: Chest pain:     Respiratory: Shortness of breath:    Gastrointestinal: Nausea/vomiting:    Musculoskeletal: Back pain:    Neurological: Weakness:    Psychological: Memory loss:    Comments/additional findings:  Past Medical History:        Diagnosis  Date         ?  Constipation       ?  H/O seasonal allergies       ?  History of BPH       ?  Hypercholesterolemia       ?  Hypertension           ?  Memory loss               Past Surgical History:         Procedure  Laterality  Date          ?  HX HERNIA REPAIR         ?  HX KNEE REPLACEMENT  Left       ?  HX ORTHOPAEDIC              bilateral shoulder replacement          ?  HX TUMOR REMOVAL  Right            shoulder              Social History          Tobacco Use         ?  Smoking status:  Former     ?  Smokeless tobacco:  Former       Substance Use Topics         ?  Alcohol use:  No         ?  Drug use:  No           No Known Allergies        Family History         Problem  Relation  Age of Onset          ?  Cancer  Mother                ovarian          ?  Cancer  Father                stomach          ?  Heart Disease  Maternal Grandmother               Current Outpatient Medications          Medication  Sig  Dispense  Refill           ?  amLODIPine-benazepril (LOTREL) 10-40 mg per capsule  Take 1 Capsule by mouth daily.  90 Capsule  1     ?  polyethylene glycol (MIRALAX) 17 gram packet  Take 1 Packet by mouth daily. Indications: constipation  30 Each  2     ?  memantine (NAMENDA) 5 mg tablet  Take 1 tab by mouth nightly for 1 week, then 1 tab twice daily thereafter.  Indications: moderate to severe Alzheimer's type dementia  180 Tablet  1     ?  donepeziL (Aricept) 5 mg tablet  Take 1 Tablet by mouth nightly.  90 Tablet  1     ?  levETIRAcetam (KEPPRA) 750 mg tablet  Take 1 Tablet by mouth two (2) times a day.  180 Tablet  1     ?  atorvastatin (LIPITOR) 40 mg tablet  Take 1 Tablet by mouth daily.  90 Tablet  1     ?  MELATONIN  PO  Take  by mouth.                ?  pumpkin seed/soy germ/Cissus (PUMPKIN SEED-SOYBEAN OIL-CISS PO)  Take  by mouth.                  PHYSICAL EXAMINATION:    There were no vitals taken for this visit.      Constitutional: WDWN, Pleasant. Smiles to voice     CV:  No peripheral swelling noted   Respiratory: No respiratory distress or difficulties   GU Male:  04/04/2021   [id number] normal to visual inspection, no erythema or irritation, Sphincter with good tone, Rectum with no hemorrhoids, fissures or masses, Prostate smooth, symmetric and anodular. Prostate is moderate  in size, firm.  Right> left   SCROTUM:  No scrotal rash or lesions noticed.  Normal bilateral testes. Large left hydrocele. Soft.    PENIS: Urethral meatus normal in location and size. No urethral discharge.   Skin: No jaundice.     Neuro/Psych:  unable to answer orientation questions appropriately          REVIEW OF LABS AND IMAGING:          Results for orders placed or performed in visit on 04/04/21     AMB POC PVR, MEAS,POST-VOID RES,US,NON-IMAGING         Result  Value  Ref Range            PVR  103  cc        A copy of today's office visit with all pertinent imaging results and labs were sent to the referring provider, NP Fields-Sykes.           Fayrene Helper, NP-C   [facility]    [address]    [place], [place] [address]    Phone: [phone]     Fax: [phone]      [address] [place], [address]   [place], [place] [address]   P: [phone]    F: [phone]

## 2021-10-09 ENCOUNTER — Encounter: Primary: Internal Medicine

## 2021-10-09 ENCOUNTER — Encounter: Attending: Urology | Primary: Family Medicine

## 2021-12-12 ENCOUNTER — Ambulatory Visit: Admit: 2021-12-12 | Discharge: 2021-12-12 | Payer: MEDICARE | Primary: Internal Medicine

## 2021-12-12 ENCOUNTER — Inpatient Hospital Stay: Admit: 2021-12-12 | Payer: MEDICARE | Primary: Internal Medicine

## 2021-12-12 DIAGNOSIS — F0390 Unspecified dementia without behavioral disturbance: Secondary | ICD-10-CM

## 2021-12-12 DIAGNOSIS — I10 Essential (primary) hypertension: Secondary | ICD-10-CM

## 2021-12-12 NOTE — Progress Notes (Signed)
Attempted to contact patient, regarding labs,no answer. Left message to return call to office.

## 2021-12-13 ENCOUNTER — Encounter: Primary: Internal Medicine

## 2021-12-13 LAB — COMPREHENSIVE METABOLIC PANEL
ALT: 25 U/L (ref 16–61)
ALT: 25 U/L (ref 16–61)
AST: 19 U/L (ref 10–38)
AST: 20 U/L (ref 10–38)
Albumin/Globulin Ratio: 1.1 (ref 0.8–1.7)
Albumin/Globulin Ratio: 1.1 (ref 0.8–1.7)
Albumin: 3.9 g/dL (ref 3.4–5.0)
Albumin: 3.9 g/dL (ref 3.4–5.0)
Alkaline Phosphatase: 93 U/L (ref 45–117)
Alkaline Phosphatase: 94 U/L (ref 45–117)
Anion Gap: 3 mmol/L (ref 3.0–18)
Anion Gap: 3 mmol/L (ref 3.0–18)
BUN: 22 MG/DL — ABNORMAL HIGH (ref 7.0–18)
BUN: 23 MG/DL — ABNORMAL HIGH (ref 7.0–18)
Bun/Cre Ratio: 16 (ref 12–20)
Bun/Cre Ratio: 17 (ref 12–20)
CO2: 30 mmol/L (ref 21–32)
CO2: 30 mmol/L (ref 21–32)
Calcium: 9.7 MG/DL (ref 8.5–10.1)
Calcium: 9.8 MG/DL (ref 8.5–10.1)
Chloride: 108 mmol/L (ref 100–111)
Chloride: 109 mmol/L (ref 100–111)
Creatinine: 1.33 MG/DL — ABNORMAL HIGH (ref 0.6–1.3)
Creatinine: 1.37 MG/DL — ABNORMAL HIGH (ref 0.6–1.3)
ESTIMATED GLOMERULAR FILTRATION RATE: 50 mL/min/{1.73_m2} — ABNORMAL LOW (ref >60)
ESTIMATED GLOMERULAR FILTRATION RATE: 52 mL/min/{1.73_m2} — ABNORMAL LOW (ref >60)
Globulin: 3.6 g/dL (ref 2.0–4.0)
Globulin: 3.7 g/dL (ref 2.0–4.0)
Glucose: 100 mg/dL — ABNORMAL HIGH (ref 74–99)
Glucose: 97 mg/dL (ref 74–99)
Potassium: 4.2 mmol/L (ref 3.5–5.5)
Potassium: 4.3 mmol/L (ref 3.5–5.5)
Sodium: 141 mmol/L (ref 136–145)
Sodium: 142 mmol/L (ref 136–145)
Total Bilirubin: 0.4 MG/DL (ref 0.2–1.0)
Total Bilirubin: 0.4 MG/DL (ref 0.2–1.0)
Total Protein: 7.5 g/dL (ref 6.4–8.2)
Total Protein: 7.6 g/dL (ref 6.4–8.2)

## 2021-12-13 LAB — CBC WITH AUTO DIFFERENTIAL
Basophils %: 1 % (ref 0–2)
Basophils Absolute: 0 10*3/uL (ref 0.0–0.1)
Eosinophils %: 1 % (ref 0–5)
Eosinophils Absolute: 0 10*3/uL (ref 0.0–0.4)
Granulocyte Absolute Count: 0 10*3/uL (ref 0.00–0.04)
Hematocrit: 40.1 % (ref 36.0–48.0)
Hemoglobin: 12.9 g/dL — ABNORMAL LOW (ref 13.0–16.0)
Immature Granulocytes: 0 % (ref 0.0–0.5)
Lymphocytes %: 37 % (ref 21–52)
Lymphocytes Absolute: 1.9 10*3/uL (ref 0.9–3.6)
MCH: 29.4 PG (ref 24.0–34.0)
MCHC: 32.2 g/dL (ref 31.0–37.0)
MCV: 91.3 FL (ref 78.0–100.0)
MPV: 11.6 FL (ref 9.2–11.8)
Monocytes %: 9 % (ref 3–10)
Monocytes Absolute: 0.5 10*3/uL (ref 0.05–1.2)
NRBC Absolute: 0 10*3/uL (ref 0.00–0.01)
Neutrophils %: 52 % (ref 40–73)
Neutrophils Absolute: 2.6 10*3/uL (ref 1.8–8.0)
Nucleated RBCs: 0 PER 100 WBC
Platelets: 167 10*3/uL (ref 135–420)
RBC: 4.39 M/uL (ref 4.35–5.65)
RDW: 14.8 % — ABNORMAL HIGH (ref 11.6–14.5)
WBC: 5.1 10*3/uL (ref 4.6–13.2)

## 2021-12-13 LAB — LIPID PANEL
CHOL/HDL Ratio: 2.2 (ref 0–5.0)
Chol/HDL Ratio: 2.2 (ref 0–5.0)
Cholesterol, Total: 165 MG/DL (ref <200)
Cholesterol, total: 165 MG/DL (ref <200)
HDL Cholesterol: 75 MG/DL — ABNORMAL HIGH (ref 40–60)
HDL: 75 MG/DL — ABNORMAL HIGH (ref 40–60)
LDL Calculated: 72 MG/DL (ref 0–100)
LDL, calculated: 72 MG/DL (ref 0–100)
Triglyceride: 90 MG/DL (ref <150)
Triglycerides: 90 MG/DL (ref <150)
VLDL Cholesterol Calculated: 18 MG/DL
VLDL, calculated: 18 MG/DL

## 2021-12-13 LAB — MAGNESIUM
Magnesium: 2.5 mg/dL (ref 1.6–2.6)
Magnesium: 2.5 mg/dL (ref 1.6–2.6)

## 2021-12-13 LAB — CBC WITH AUTOMATED DIFF
ABS. BASOPHILS: 0 10*3/uL (ref 0.0–0.1)
ABS. EOSINOPHILS: 0 10*3/uL (ref 0.0–0.4)
ABS. IMM. GRANS.: 0 10*3/uL (ref 0.00–0.04)
ABS. LYMPHOCYTES: 1.9 10*3/uL (ref 0.9–3.6)
ABS. MONOCYTES: 0.5 10*3/uL (ref 0.05–1.2)
ABS. NEUTROPHILS: 2.6 10*3/uL (ref 1.8–8.0)
ABSOLUTE NRBC: 0 10*3/uL (ref 0.00–0.01)
BASOPHILS: 1 % (ref 0–2)
EOSINOPHILS: 1 % (ref 0–5)
HCT: 40.1 % (ref 36.0–48.0)
HGB: 12.9 g/dL — ABNORMAL LOW (ref 13.0–16.0)
IMMATURE GRANULOCYTES: 0 % (ref 0.0–0.5)
LYMPHOCYTES: 37 % (ref 21–52)
MCH: 29.4 PG (ref 24.0–34.0)
MCHC: 32.2 g/dL (ref 31.0–37.0)
MCV: 91.3 FL (ref 78.0–100.0)
MONOCYTES: 9 % (ref 3–10)
MPV: 11.6 FL (ref 9.2–11.8)
NEUTROPHILS: 52 % (ref 40–73)
NRBC: 0 PER 100 WBC
PLATELET: 167 10*3/uL (ref 135–420)
RBC: 4.39 M/uL (ref 4.35–5.65)
RDW: 14.8 % — ABNORMAL HIGH (ref 11.6–14.5)
WBC: 5.1 10*3/uL (ref 4.6–13.2)

## 2021-12-13 LAB — METABOLIC PANEL, COMPREHENSIVE
A-G Ratio: 1.1 (ref 0.8–1.7)
A-G Ratio: 1.1 (ref 0.8–1.7)
ALT (SGPT): 25 U/L (ref 16–61)
ALT (SGPT): 25 U/L (ref 16–61)
AST (SGOT): 19 U/L (ref 10–38)
AST (SGOT): 20 U/L (ref 10–38)
Albumin: 3.9 g/dL (ref 3.4–5.0)
Albumin: 3.9 g/dL (ref 3.4–5.0)
Alk. phosphatase: 93 U/L (ref 45–117)
Alk. phosphatase: 94 U/L (ref 45–117)
Anion gap: 3 mmol/L (ref 3.0–18)
Anion gap: 3 mmol/L (ref 3.0–18)
BUN/Creatinine ratio: 16 (ref 12–20)
BUN/Creatinine ratio: 17 (ref 12–20)
BUN: 22 MG/DL — ABNORMAL HIGH (ref 7.0–18)
BUN: 23 MG/DL — ABNORMAL HIGH (ref 7.0–18)
Bilirubin, total: 0.4 MG/DL (ref 0.2–1.0)
Bilirubin, total: 0.4 MG/DL (ref 0.2–1.0)
CO2: 30 mmol/L (ref 21–32)
CO2: 30 mmol/L (ref 21–32)
Calcium: 9.7 MG/DL (ref 8.5–10.1)
Calcium: 9.8 MG/DL (ref 8.5–10.1)
Chloride: 108 mmol/L (ref 100–111)
Chloride: 109 mmol/L (ref 100–111)
Creatinine: 1.33 MG/DL — ABNORMAL HIGH (ref 0.6–1.3)
Creatinine: 1.37 MG/DL — ABNORMAL HIGH (ref 0.6–1.3)
Globulin: 3.6 g/dL (ref 2.0–4.0)
Globulin: 3.7 g/dL (ref 2.0–4.0)
Glucose: 100 mg/dL — ABNORMAL HIGH (ref 74–99)
Glucose: 97 mg/dL (ref 74–99)
Potassium: 4.2 mmol/L (ref 3.5–5.5)
Potassium: 4.3 mmol/L (ref 3.5–5.5)
Protein, total: 7.5 g/dL (ref 6.4–8.2)
Protein, total: 7.6 g/dL (ref 6.4–8.2)
Sodium: 141 mmol/L (ref 136–145)
Sodium: 142 mmol/L (ref 136–145)
eGFR: 50 mL/min/{1.73_m2} — ABNORMAL LOW (ref >60)
eGFR: 52 mL/min/{1.73_m2} — ABNORMAL LOW (ref >60)

## 2021-12-20 ENCOUNTER — Encounter

## 2021-12-20 ENCOUNTER — Encounter: Attending: Acute Care | Primary: Internal Medicine

## 2021-12-20 ENCOUNTER — Ambulatory Visit: Admit: 2021-12-20 | Discharge: 2021-12-20 | Payer: MEDICARE | Attending: Internal Medicine | Primary: Internal Medicine

## 2021-12-20 DIAGNOSIS — I10 Essential (primary) hypertension: Secondary | ICD-10-CM

## 2021-12-20 MED ORDER — SENNOSIDES-DOCUSATE SODIUM 8.6 MG-50 MG TAB
ORAL_TABLET | Freq: Two times a day (BID) | ORAL | 3 refills | Status: AC
Start: 2021-12-20 — End: ?

## 2021-12-20 NOTE — Progress Notes (Signed)
Kyle Williams is a 88 y.o. male presenting today for Follow Up Chronic Condition (Denies any concerns. )  .     Chief Complaint   Patient presents with    Follow Up Chronic Condition     Denies any concerns.        HPI:  Kyle Williams presents to the office today for follow up.     Patient has a PMHx of dementia, seizures, HLD, HTN, CKD3.    Patient is present with his son.    Seizures: Patient is on Keppra.  No recent seizures.    Dementia: He is on Aricept.  He was also started on Namenda but is currently not on it-family felt it did no benefit.  Following with neurology. Patient has limited communication and is unable to recognize his family.  He is not oriented to person, place or time.  His son is the primary care taker.   He recently was admitted to the hospital due to dehydration and has history of constipation.  Son and daughter have been giving him prune juice and Metamucil daily.  However, patient continues to have constipation with hard stools-stone-like    HTN: Prescribed amlodipine and benazepril.    HLD: Prescribed Lipitor 40 mg daily.  Lipid panel in 1/23 showed LDL 72, HDL 75, triglyceride 90.    Patient's daughter reports that patient has been having gait instability.  No recent falls.    Review of Systems   Unable to perform ROS: Dementia     No Known Allergies    PHQ Screening   3 most recent PHQ Screens 12/20/2021   Little interest or pleasure in doing things Not at all   Feeling down, depressed, irritable, or hopeless Not at all   Total Score PHQ 2 0   Trouble falling or staying asleep, or sleeping too much -   Feeling tired or having little energy -   Poor appetite, weight loss, or overeating -   Feeling bad about yourself - or that you are a failure or have let yourself or your family down -   Trouble concentrating on things such as school, work, reading, or watching TV -   Moving or speaking so slowly that other people could have noticed; or the opposite being so fidgety that others notice  -   Thoughts of being better off dead, or hurting yourself in some way -   PHQ 9 Score -   How difficult have these problems made it for you to do your work, take care of your home and get along with others -       History  Past Medical History:   Diagnosis Date    Constipation     H/O seasonal allergies     History of BPH     Hypercholesterolemia     Hypertension     Memory loss        Past Surgical History:   Procedure Laterality Date    HX HERNIA REPAIR      HX KNEE REPLACEMENT Left     HX ORTHOPAEDIC      bilateral shoulder replacement    HX TUMOR REMOVAL Right     shoulder        Social History     Socioeconomic History    Marital status: MARRIED     Spouse name: Not on file    Number of children: Not on file    Years of education: Not on file  Highest education level: Not on file   Occupational History    Not on file   Tobacco Use    Smoking status: Former    Smokeless tobacco: Former   Substance and Sexual Activity    Alcohol use: No    Drug use: No    Sexual activity: Not Currently     Partners: Female   Other Topics Concern    Not on file   Social History Narrative    Not on file     Social Determinants of Health     Financial Resource Strain: Not on file   Food Insecurity: Not on file   Transportation Needs: Not on file   Physical Activity: Not on file   Stress: Not on file   Social Connections: Not on file   Intimate Partner Violence: Not on file   Housing Stability: Not on file       Current Outpatient Medications   Medication Sig Dispense Refill    senna-docusate (PERICOLACE) 8.6-50 mg per tablet Take 1 Tablet by mouth two (2) times a day. 60 Tablet 3    amLODIPine-benazepril (LOTREL) 10-40 mg per capsule Take 1 Capsule by mouth daily. 90 Capsule 1    polyethylene glycol (MIRALAX) 17 gram packet Take 1 Packet by mouth daily. Indications: constipation 30 Each 2    donepeziL (Aricept) 5 mg tablet Take 1 Tablet by mouth nightly. 90 Tablet 1    levETIRAcetam (KEPPRA) 750 mg tablet Take 1 Tablet by mouth  two (2) times a day. 180 Tablet 1    atorvastatin (LIPITOR) 40 mg tablet Take 1 Tablet by mouth daily. 90 Tablet 1    MELATONIN PO Take  by mouth.      pumpkin seed/soy germ/Cissus (PUMPKIN SEED-SOYBEAN OIL-CISS PO) Take  by mouth.      memantine (NAMENDA) 5 mg tablet Take 1 tab by mouth nightly for 1 week, then 1 tab twice daily thereafter.  Indications: moderate to severe Alzheimer's type dementia (Patient not taking: Reported on 12/20/2021) 180 Tablet 1         Vitals:    12/20/21 1312   BP: 127/67   Pulse: (!) 53   Resp: 13   Weight: 150 lb (68 kg)   Height: 6' 2"  (1.88 m)   PainSc:   0 - No pain       Physical Exam  Vitals and nursing note reviewed.   Constitutional:       General: He is not in acute distress.     Appearance: Normal appearance. He is not ill-appearing, toxic-appearing or diaphoretic.   HENT:      Head: Normocephalic and atraumatic.      Ears:      Comments: Hearing difficulty  Eyes:      General: No scleral icterus.     Extraocular Movements: Extraocular movements intact.      Conjunctiva/sclera: Conjunctivae normal.      Pupils: Pupils are equal, round, and reactive to light.   Cardiovascular:      Rate and Rhythm: Normal rate and regular rhythm.      Pulses: Normal pulses.      Heart sounds: Normal heart sounds. No murmur heard.  Pulmonary:      Effort: Pulmonary effort is normal. No respiratory distress.      Breath sounds: Normal breath sounds. No wheezing or rales.   Musculoskeletal:         General: No swelling or tenderness. Normal range of motion.  Cervical back: Normal range of motion and neck supple.      Right lower leg: No edema.      Left lower leg: No edema.   Skin:     General: Skin is warm and dry.      Coloration: Skin is not jaundiced or pale.   Neurological:      Mental Status: He is alert. He is disoriented.      Comments: A.O X 0  Not following commands       Hospital Outpatient Visit on 12/12/2021   Component Date Value Ref Range Status    WBC 12/12/2021 5.1  4.6 - 13.2  K/uL Final    RBC 12/12/2021 4.39  4.35 - 5.65 M/uL Final    HGB 12/12/2021 12.9 (A)  13.0 - 16.0 g/dL Final    HCT 12/12/2021 40.1  36.0 - 48.0 % Final    MCV 12/12/2021 91.3  78.0 - 100.0 FL Final    MCH 12/12/2021 29.4  24.0 - 34.0 PG Final    MCHC 12/12/2021 32.2  31.0 - 37.0 g/dL Final    RDW 12/12/2021 14.8 (A)  11.6 - 14.5 % Final    PLATELET 12/12/2021 167  135 - 420 K/uL Final    MPV 12/12/2021 11.6  9.2 - 11.8 FL Final    NRBC 12/12/2021 0.0  0 PER 100 WBC Final    ABSOLUTE NRBC 12/12/2021 0.00  0.00 - 0.01 K/uL Final    NEUTROPHILS 12/12/2021 52  40 - 73 % Final    LYMPHOCYTES 12/12/2021 37  21 - 52 % Final    MONOCYTES 12/12/2021 9  3 - 10 % Final    EOSINOPHILS 12/12/2021 1  0 - 5 % Final    BASOPHILS 12/12/2021 1  0 - 2 % Final    IMMATURE GRANULOCYTES 12/12/2021 0  0.0 - 0.5 % Final    ABS. NEUTROPHILS 12/12/2021 2.6  1.8 - 8.0 K/UL Final    ABS. LYMPHOCYTES 12/12/2021 1.9  0.9 - 3.6 K/UL Final    ABS. MONOCYTES 12/12/2021 0.5  0.05 - 1.2 K/UL Final    ABS. EOSINOPHILS 12/12/2021 0.0  0.0 - 0.4 K/UL Final    ABS. BASOPHILS 12/12/2021 0.0  0.0 - 0.1 K/UL Final    ABS. IMM. GRANS. 12/12/2021 0.0  0.00 - 0.04 K/UL Final    DF 12/12/2021 AUTOMATED    Final    LIPID PROFILE 12/12/2021        Final    Cholesterol, total 12/12/2021 165  <200 MG/DL Final    Triglyceride 12/12/2021 90  <150 MG/DL Final    Comment: The drugs N-acetylcysteine (NAC) and  Metamiszole have been found to cause falsely  low results in this chemical assay. Please  be sure to submit blood samples obtained  BEFORE administration of either of these  drugs to assure correct results.      HDL Cholesterol 12/12/2021 75 (A)  40 - 60 MG/DL Final    LDL, calculated 12/12/2021 72  0 - 100 MG/DL Final    VLDL, calculated 12/12/2021 18  MG/DL Final    CHOL/HDL Ratio 12/12/2021 2.2  0 - 5.0   Final    Sodium 12/12/2021 141  136 - 145 mmol/L Final    Potassium 12/12/2021 4.3  3.5 - 5.5 mmol/L Final    Chloride 12/12/2021 108  100 - 111 mmol/L Final     CO2 12/12/2021 30  21 - 32 mmol/L Final    Anion gap  12/12/2021 3  3.0 - 18 mmol/L Final    Glucose 12/12/2021 97  74 - 99 mg/dL Final    BUN 12/12/2021 22 (A)  7.0 - 18 MG/DL Final    Creatinine 12/12/2021 1.37 (A)  0.6 - 1.3 MG/DL Final    BUN/Creatinine ratio 12/12/2021 16  12 - 20   Final    eGFR 12/12/2021 50 (A)  >60 ml/min/1.3m Final    Comment:      Pediatric calculator link: https://www.kidney.org/professionals/kdoqi/gfr_calculatorped       These results are not intended for use in patients <188years of age.       eGFR results are calculated without a race factor using  the 2021 CKD-EPI equation. Careful clinical correlation is recommended, particularly when comparing to results calculated using previous equations.  The CKD-EPI equation is less accurate in patients with extremes of muscle mass, extra-renal metabolism of creatinine, excessive creatine ingestion, or following therapy that affects renal tubular secretion.      Calcium 12/12/2021 9.7  8.5 - 10.1 MG/DL Final    Bilirubin, total 12/12/2021 0.4  0.2 - 1.0 MG/DL Final    ALT (SGPT) 12/12/2021 25  16 - 61 U/L Final    AST (SGOT) 12/12/2021 19  10 - 38 U/L Final    Alk. phosphatase 12/12/2021 93  45 - 117 U/L Final    Protein, total 12/12/2021 7.6  6.4 - 8.2 g/dL Final    Albumin 12/12/2021 3.9  3.4 - 5.0 g/dL Final    Globulin 12/12/2021 3.7  2.0 - 4.0 g/dL Final    A-G Ratio 12/12/2021 1.1  0.8 - 1.7   Final   Hospital Outpatient Visit on 12/12/2021   Component Date Value Ref Range Status    Sodium 12/12/2021 142  136 - 145 mmol/L Final    Potassium 12/12/2021 4.2  3.5 - 5.5 mmol/L Final    Chloride 12/12/2021 109  100 - 111 mmol/L Final    CO2 12/12/2021 30  21 - 32 mmol/L Final    Anion gap 12/12/2021 3  3.0 - 18 mmol/L Final    Glucose 12/12/2021 100 (A)  74 - 99 mg/dL Final    BUN 12/12/2021 23 (A)  7.0 - 18 MG/DL Final    Creatinine 12/12/2021 1.33 (A)  0.6 - 1.3 MG/DL Final    BUN/Creatinine ratio 12/12/2021 17  12 - 20   Final    eGFR  12/12/2021 52 (A)  >60 ml/min/1.731mFinal    Comment:      Pediatric calculator link: https://www.kidney.org/professionals/kdoqi/gfr_calculatorped       These results are not intended for use in patients <1841ears of age.       eGFR results are calculated without a race factor using  the 2021 CKD-EPI equation. Careful clinical correlation is recommended, particularly when comparing to results calculated using previous equations.  The CKD-EPI equation is less accurate in patients with extremes of muscle mass, extra-renal metabolism of creatinine, excessive creatine ingestion, or following therapy that affects renal tubular secretion.      Calcium 12/12/2021 9.8  8.5 - 10.1 MG/DL Final    Bilirubin, total 12/12/2021 0.4  0.2 - 1.0 MG/DL Final    ALT (SGPT) 12/12/2021 25  16 - 61 U/L Final    AST (SGOT) 12/12/2021 20  10 - 38 U/L Final    Alk. phosphatase 12/12/2021 94  45 - 117 U/L Final    Protein, total 12/12/2021 7.5  6.4 - 8.2 g/dL Final  Albumin 12/12/2021 3.9  3.4 - 5.0 g/dL Final    Globulin 12/12/2021 3.6  2.0 - 4.0 g/dL Final    A-G Ratio 12/12/2021 1.1  0.8 - 1.7   Final    Magnesium 12/12/2021 2.5  1.6 - 2.6 mg/dL Final       No results found for any visits on 12/20/21.    Patient Care Team:  Patient Care Team:  Kizzie Fantasia, MD as PCP - General (Internal Medicine Physician)  Kizzie Fantasia, MD as PCP - [facility] Empaneled Provider  Arabella Merles, MD as Physician (Urology)  Jeralyn Bennett, MD as Consulting Provider (Neurology)      Assessment / Plan:      ICD-10-CM ICD-9-CM    1. Essential hypertension  I10 401.9       2. Seizure ([place])  R56.9 780.39       3. Alzheimer's dementia without behavioral disturbance ([place])  G30.9 331.0     F02.80 294.10       4. Constipation, unspecified constipation type  K59.00 564.00 senna-docusate (PERICOLACE) 8.6-50 mg per tablet      5. Encounter for immunization  Z23 V03.89 PNEUMOCOCCAL, PCV20, PREVNAR 20, (AGE 4 YRS+), IM, PF      6. Mixed hyperlipidemia  E78.2  272.2         Labs reviewed and discussed with patient's son.    History of seizures and dementia: Following with neurology.  Continue Keppra and Aricept.    HTN: BP at goal.  Continue amlodipine and benazepril.      HLD: Controlled on Lipitor.    Constipation: Continue prune juice, Metamucil and MiraLAX.  Will start on senna/Colace.    Received PCV-20 vaccine today.     Follow-up and Dispositions    Return in about 4 months (around 04/19/2022).           I asked the patient if he  had any questions and answered his  questions.  The patient stated that he understands the treatment plan and agrees with the treatment plan    This document was created with a voice activated dictation system and may contain transcription errors.

## 2022-01-09 ENCOUNTER — Encounter

## 2022-01-09 MED ORDER — FINASTERIDE 5 MG PO TABS
5 MG | ORAL_TABLET | Freq: Every day | ORAL | 0 refills | Status: AC
Start: 2022-01-09 — End: 2022-04-06

## 2022-01-09 MED ORDER — LEVETIRACETAM 750 MG PO TABS
750 MG | ORAL_TABLET | Freq: Two times a day (BID) | ORAL | 0 refills | Status: DC
Start: 2022-01-09 — End: 2022-03-28

## 2022-01-09 MED ORDER — DONEPEZIL HCL 5 MG PO TABS
5 MG | ORAL_TABLET | Freq: Every evening | ORAL | 0 refills | Status: DC
Start: 2022-01-09 — End: 2022-03-28

## 2022-01-09 MED ORDER — ATORVASTATIN CALCIUM 40 MG PO TABS
40 MG | ORAL_TABLET | Freq: Every day | ORAL | 1 refills | Status: AC
Start: 2022-01-09 — End: ?

## 2022-01-09 MED ORDER — AMLODIPINE BESY-BENAZEPRIL HCL 10-40 MG PO CAPS
10-40 MG | ORAL_CAPSULE | Freq: Every day | ORAL | 0 refills | Status: DC
Start: 2022-01-09 — End: 2022-04-06

## 2022-03-28 ENCOUNTER — Ambulatory Visit
Admit: 2022-03-28 | Discharge: 2022-03-28 | Payer: TRICARE (CHAMPUS) | Attending: Acute Care | Primary: Internal Medicine

## 2022-03-28 ENCOUNTER — Encounter: Payer: MEDICARE | Attending: Acute Care | Primary: Internal Medicine

## 2022-03-28 DIAGNOSIS — F03C18 Unspecified dementia, severe, with other behavioral disturbance: Secondary | ICD-10-CM

## 2022-03-28 MED ORDER — LEVETIRACETAM 750 MG PO TABS
750 MG | ORAL_TABLET | Freq: Two times a day (BID) | ORAL | 5 refills | Status: AC
Start: 2022-03-28 — End: 2022-06-25

## 2022-03-28 MED ORDER — SERTRALINE HCL 25 MG PO TABS
25 MG | ORAL_TABLET | ORAL | 5 refills | Status: AC
Start: 2022-03-28 — End: 2022-06-25

## 2022-03-28 MED ORDER — DONEPEZIL HCL 5 MG PO TABS
5 MG | ORAL_TABLET | Freq: Every evening | ORAL | 5 refills | Status: AC
Start: 2022-03-28 — End: 2022-06-25

## 2022-03-28 MED ORDER — MEMANTINE HCL 5 MG PO TABS
5 MG | ORAL_TABLET | ORAL | 5 refills | Status: AC
Start: 2022-03-28 — End: 2022-06-25

## 2022-03-28 NOTE — Patient Instructions (Signed)
[email or web address]

## 2022-03-28 NOTE — Progress Notes (Signed)
[facility]  [address] [place], [place], [place] [address]  Office:  [phone]  Fax: [phone]  Chief Complaint   Patient presents with    Follow-up       HPI: Kyle Williams presents in follow-up for Alzheimer's dementia.  He was last seen here on 08/30/2021.  He was noted by his family to be progressively worsening.  He has 24/7 care now.  He continues Aricept 5 mg, and Namenda was to be continued if not taking this.  Denied further seizures.  He continued Keppra 750 mg twice daily.  Picking behaviors were noted.  Recommended nonpharmacologic measures.  Planned to check CMP and magnesium.  CMP showed renal function.    He presents today in follow-up.  He is with his daughter who provides history.  Reports he is getting worse. Getting mean.  It is getting difficult to move him.  He doesn't take direction well.  His son had a stroke.  Eating well.  Going to the bathroom.  Takes melatonin and OTC sleep aid.  Takes it BID, it keeps him mellow. Otherwise pulls things apart, pulls sink.  Taes Zzquil, it works.  Not walking now, pulls.  +hallucinations, throwing stuff.  Calls daughter 'mom'.  No recent illness or infection.  Takes OTC Claritin due to allergies.  Mainly more meanness, doesn't want her to touch him.  She is working from down here since her brother had a stroke then retired.  She reports her dad had energy, but nothing to do, now stopped, won't do puzzles or coloring.  She is making a Pharmacologist.  Tears the bed up. Taking same meds- Aricept, Namenda.  Take Keppra.  No seizures in the interim.  No pain.  His wife has dementia as well.      Past Medical History:   Diagnosis Date    Constipation     H/O seasonal allergies     History of BPH     Hypercholesterolemia     Hypertension     Memory loss        Past Surgical History:   Procedure Laterality Date    HERNIA REPAIR      ORTHOPEDIC SURGERY      bilateral shoulder replacement    TOTAL KNEE ARTHROPLASTY  Left     TUMOR REMOVAL Right     shoulder        Current Outpatient Medications   Medication Sig Dispense Refill    sertraline (ZOLOFT) 25 MG tablet Take 1 tab by mouth daily. In 1 week can increase to 2 tabs daily. 60 tablet 5    levETIRAcetam (KEPPRA) 750 MG tablet Take 1 tablet by mouth 2 times daily 60 tablet 5    donepezil (ARICEPT) 5 MG tablet Take 1 tablet by mouth nightly 30 tablet 5    memantine (NAMENDA) 5 MG tablet Take 1 tab by mouth twice daily. 60 tablet 5    Loratadine (CLARITIN PO) Take by mouth      diphenhydrAMINE HCl, Sleep, (ZZZQUIL PO) Take 15 mLs by mouth nightly as needed (sleep) Sleep      solifenacin (VESICARE) 5 MG tablet Take 1 tablet by mouth daily      finasteride (PROSCAR) 5 MG tablet Take 1 tablet by mouth daily 90 tablet 0    amLODIPine-benazepril (LOTREL) 10-40 MG per capsule Take 1 capsule by mouth daily 90 capsule 0    atorvastatin (LIPITOR) 40 MG tablet Take 1 tablet by  mouth daily 90 tablet 1    MELATONIN PO Take 6 mg by mouth daily      polyethylene glycol (GLYCOLAX) 17 GM/SCOOP powder Take 17 g by mouth daily Mix with 8 oz liquid of choice      Misc Natural Products (PUMPKIN SEED OIL) CAPS Take 1,000 mg by mouth daily.      Zn-Pyg Afri-Nettle-Saw Palmet (SAW PALMETTO COMPLEX PO) Take 2 tablets by mouth daily.      Sennosides (SENNA) 8.8 MG/5ML LIQD Take  by mouth daily. 1 teaspoon daily       No current facility-administered medications for this visit.        No Known Allergies    Social History     Tobacco Use    Smoking status: Former    Smokeless tobacco: Former   Substance Use Topics    Alcohol use: No    Drug use: No       Family History   Problem Relation Age of Onset    Cancer Mother         ovarian    Cancer Father         stomach    Heart Disease Maternal Grandmother        Review of Systems:  Cannot obtain ROS due to medical condition but he denies any specific complaint.    Physical Examination:  BP 110/70   Pulse 86   Resp 18   Ht 6\' 2"  (1.88 m)   Wt 150 lb (68  kg)   SpO2 93%   BMI 19.26 kg/m     Alert, in NAD. Heart is regular. Orientation: Does not state age, place, year, month, DOB.  Endorses he is [name]  Does not speak much. Answered yes or no appropriately. Daughter provided history. Speech is fluent. Speech clear. EOMs are full, PERRL, VFF to threat, no nystagmus. No facial asymmetry. Not participating with full exam.  He moves all extremities. He is in the wheelchair. He stood. Issues with following commands such as to take feet on and off the foot rest.  York Spaniel 'bye' at the end when she asked him to.        Impression/Plan:  This is an 88 year old right-handed male who presents in follow-up for Alzheimer's dementia.  He has had dementia progressively worsening.  He is on donepezil 5 mg daily and Namenda 5 mg twice daily.  He requires help for all ADLs and IADLs.  Behavior has been difficult, gets agitated, uncooperative.  Will start sertraline for behavior.  Advised on administration and potential side effects.  Recommended [email or web address] for resources.  Try to reduce the Claritin and ZzzQuil because of cognitive issues.  Continue Keppra 750 mg BID.  Home health PT, social worker, home aide.  Follow up in 2-3 months.      Kyle Williams was seen today for follow-up.    Diagnoses and all orders for this visit:    Severe dementia with other behavioral disturbance, unspecified dementia type (HCC)  -     [facility] - Home Health, [place]  -     sertraline (ZOLOFT) 25 MG tablet; Take 1 tab by mouth daily. In 1 week can increase to 2 tabs daily.    Gait abnormality  -     [facility] - Home Health, [place]    Impaired mobility and ADLs  -     [facility] - Home Health, [place]    Seizure (HCC)  -     levETIRAcetam (KEPPRA) 750 MG  tablet; Take 1 tablet by mouth 2 times daily    Alzheimer's disease, unspecified (CODE) (HCC)  -     donepezil (ARICEPT) 5 MG tablet; Take 1 tablet by mouth nightly    Other orders  -     memantine  (NAMENDA) 5 MG tablet; Take 1 tab by mouth twice daily.      Total time 26 minutes with 15 minutes spent in counseling.     Signed By: Maurine Simmering, APRN - NP        PLEASE NOTE:   Portions of this document may have been produced using voice recognition software. Unrecognized errors in transcription may be present.

## 2022-03-30 ENCOUNTER — Encounter: Payer: TRICARE (CHAMPUS) | Primary: Internal Medicine

## 2022-03-30 ENCOUNTER — Encounter: Admit: 2022-03-30 | Discharge: 2022-03-30 | Payer: MEDICARE | Primary: Internal Medicine

## 2022-03-30 NOTE — Home Health (Signed)
PT 1w2, to f/u on home safety ideas.  Request SLP assessment for dementia and strategies for family/caregivers.  MSW for community resources, guidance in ways to get support for family to care for the patient.  [name]  HPI:  Per referral and MD visit note:  "He presents today in follow-up.  Getitng worse. Getting mean. Son had a stroke. With daugther there. Getting difficult to move ihm. Doesn't take direction well.  Eating well. Going to bathroom. Melatonin and OTC sleep aid.  Bid, keeps him mellow. Otherwise pulls things apart, pulls sink. Zzquil, works. Not walking now, pulls. +hallucinations, throwing stuff. Calls her mom. No recent illness. otc clairin. Allergies. Mainly more meanness, doesn't want her to touch him. She's working from down here since brother had stroke then retired. Has energy, nothng to do, now stopped, wont do puzzles or coloring. Shes Community education officer activity blanket. Tears the bedup. Taking same meds, aricept, namenda, keppra no seizures. No pain."  .  PMHx:    Hyperlipidemia   Constipation    Memory loss    Dementia without behavioral disturbance   Benign prostatic hyperplasia    Seasonal allergic rhinitis    Essential hypertension    Seizure  12/20/2021   .  SUBJECTIVE:    Patient was fairly cooperative throughout this visit.  He was interested in sitting near therapist and CG.  He was unable to answer questions or to follow instructions for the special tests for strength and balance.  He did follow instructions to walk to the bedroom and bathroom and back.    LIVING SITUATION:  He lives with his wife who is blind and early dementia, and his daughter who is currently providing all care.  His son was the provider intil recently and is away at this time, to be returning after his vacation time.    CAREGIVER INVOLVEMENT/ASSISTANCE NEEDED FOR:  All care and ADLs, meals, med management and safety.  PLOF:  He has gradually declined over the past several years.[name]   MEDICATIONS RECONCILED AND UPDATED AS  FOLLOWS: No issues.  High risk medication teaching regarding anticoagulants, hyperglycemic agents or opioid narcotics performed for: n/a.  Instructed patient on Continue to follow up with MD re; effectiveness of meds, as appropriate.  The following medication discrepancies/interactions were noted: n/a.    NEXT MD APPT:  TBD.  Patient/caregiver encouraged/instructed to keep appointment as lack of follow through with physician appointment could result in discontinuation of home care services for non-compliance.  .  ROM:  BLEs WFL per observation of movement and transfers.  STRENGTH: BLEs WFL per observation of movement and transfers.  WOUNDS:  N/a  BED MOBILITY: independent  TRANSFERS: Supervision needed for safety.  Tub transfer to step in for shower requores Mod A and max VC for sequencing.  Has a raised toilet seat and toilet safety frame.  GAIT: no AD.  Gait characterized by Reciprocal pattern, able to maintain balance through home, moderate cadence and speed, no LOB..  Cues/instruction provided for to guide patient through home and prevent him from going into the wrong room.    STAIRS:  There are a few steps to enter home.  BALANCE:  Pt scored 9/10 on [facility] 10 fall assessment, placing him at risk for falls.   PATIENT RESPONSE TO TREATMENT:   Patient cooperated with therapist as able per his dementia.  Did not demonstrate inc pain or fatigue.  [name]  PATIENT /CAREGIVER EDUCATION PROVIDED THIS VISIT:  Home safety to include grab bar placement  in bathroom, HEP: walking, HIPAA, HHBOR    PATIENT/CAREGIVER RESPONSE TO EDUCATION PROVIDED: CG verbalized understanding.  [name]  HEP consisting of:  1. Walking for exercise to maintain strength and for cardio benefit.  Go outside if it is safe.     Written HEP issued, patient/caregiver verbalized understanding; however, will need reinforcement and continued education for re-assess to see if able to do this as a HEP..    .  ASSESSMENT AND CONTINUED NEED FOR THE FOLLOWING SKILLS:    Patient will benefit from 1 PT follow up visit to assess for safety recommendation and HEP compliance and ability.  Referrals need to be made for SLP assessment to determine best strategies for interaction with the patient per his dementia.  [name]   POC:  Patient/caregiver instructed on POC and are agreeable to POC at this time.  POC and admission to home health status called to Estevan Oaks, APRN  - NP.   PLAN: PT 1w2, request SLP eval.    DISCHARGE PLANNING DISCUSSED: Discharge to self and family under MD supervision once all goals have been met or patient has reached max potential. Patient/caregiver verbalized understanding.

## 2022-04-02 ENCOUNTER — Encounter: Payer: TRICARE (CHAMPUS) | Primary: Internal Medicine

## 2022-04-02 NOTE — Telephone Encounter (Signed)
I received a call from [name] and she is requesting Kyle Williams for Speech Therapy for pt Kyle Williams was given.

## 2022-04-03 ENCOUNTER — Encounter: Payer: TRICARE (CHAMPUS) | Primary: Internal Medicine

## 2022-04-05 ENCOUNTER — Encounter: Admit: 2022-04-05 | Discharge: 2022-04-05 | Payer: MEDICARE | Primary: Internal Medicine

## 2022-04-05 ENCOUNTER — Encounter: Payer: TRICARE (CHAMPUS) | Primary: Internal Medicine

## 2022-04-05 NOTE — Home Health (Signed)
RECENT HX OF CURRENT ILLNESS/REASON FOR REFERRAL: Patient referred to speech therapy for assistance with increased confusion and lack of cooperation with ADLs in the setting of Alzheimers dementia    PLOF/DIET/COMMUNICATION: dementia    PAST MEDICAL HISTORY:Hyperlipidemia     Constipation      Memory loss      Dementia without behavioral disturbance     Benign prostatic hyperplasia      Seasonal allergic rhinitis      Essential hypertension      Seizure  12/20/2021      CURRENT RESIDENCE/LIVING SITUATION: Lives with spouse who has dementia and is blind and daughter who is here temporarily to provide care.     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 Patient attempts conversation however, word finding deficit severely impairs ability to verbally communicate. Patient uses gestures and facial expressions to help with communication throughout session and is agitated by therapist visit.     CAREGIVER INVOLVEMENT: caregivers provide assistance with medications, melas, and ADLs    ASSESSMENT / BARRIERS / PLAN: Patient received cognitive linguistic assessment which is remarkable for dementia. He was unable to state his name and DOB, and unable to complete the [facility] Mental Status Examination due to disease. Patient is able to read large print single sentences which facilitates his ability to follow directions for tasks such as grooming, dressing and safety. Caregiver is independent with safety strategies and strategies to increase cognitive stimulation. However, patient is resistant to activities closing his eyes, becoming angry, or refusal.      PATIENT RESPONSE TO TREATMENT:  Pt is unable minimally cooperative and requires encouragement to participate. SLP provided caregiver with education to potentially increase patient participation in ADL completion and decrease burden of care. Patient was  able to ready single sentence with assistance.     PATIENT LEVEL OF UNDERSTANDING OF EDUCATION PROVIDED: Caregiver agrees with strategies provided and evaluation only.        MD NOTIFIED OF POC AND FREQUENCY: Antonieta Iba NP     PT GOALS: n/a     HOME EXERCISE PROGRAM: strategies to increase cooperation with ADL completion     DISCHARGE PLANNING - (ANTICIPATED DC DATE, DC PLAN): ST to d/c today. Pt/caregiver verbalized understanding

## 2022-04-05 NOTE — Home Health (Signed)
MSW met with the pt and his daughter/caregiver James Ivanoff, who relocated to the area to provide additional care for the pt and his spouse, who has multiple medical challenges. Pt is unable to answer many questions and moves around frequently. MSW provided the pt's daughter documentation of social service resources, 42 (resource hotline), Database administrator of [place], and support programs. MSW also provided information about care assistance options/costs/funding sources, contact information for the [employer] and [employer], and discussed Guardianship. MSW also provided several [employer] company options via e-mail ([email or web address]) on 04/06/22. MSW invited a return call if resource questions arise.

## 2022-04-06 ENCOUNTER — Encounter: Payer: TRICARE (CHAMPUS) | Primary: Internal Medicine

## 2022-04-06 ENCOUNTER — Encounter

## 2022-04-06 MED ORDER — FINASTERIDE 5 MG PO TABS
5 MG | ORAL_TABLET | Freq: Every day | ORAL | 0 refills | Status: AC
Start: 2022-04-06 — End: 2022-07-19

## 2022-04-06 MED ORDER — AMLODIPINE BESY-BENAZEPRIL HCL 10-40 MG PO CAPS
10-40 MG | ORAL_CAPSULE | Freq: Every day | ORAL | 0 refills | Status: DC
Start: 2022-04-06 — End: 2022-07-19

## 2022-04-06 NOTE — Telephone Encounter (Signed)
Requested Prescriptions     Pending Prescriptions Disp Refills    finasteride (PROSCAR) 5 MG tablet 90 tablet 0     Sig: Take 1 tablet by mouth daily    amLODIPine-benazepril (LOTREL) 10-40 MG per capsule 90 capsule 0     Sig: Take 1 capsule by mouth daily    levETIRAcetam (KEPPRA) 750 MG tablet 60 tablet 5     Sig: Take 1 tablet by mouth 2 times daily    donepezil (ARICEPT) 5 MG tablet 30 tablet 5     Sig: Take 1 tablet by mouth nightly

## 2022-04-10 ENCOUNTER — Emergency Department: Admit: 2022-04-10 | Payer: MEDICARE | Primary: Internal Medicine

## 2022-04-10 ENCOUNTER — Inpatient Hospital Stay
Admit: 2022-04-10 | Discharge: 2022-04-11 | Disposition: A | Discharge status: Home / Self Care | Payer: MEDICARE | Attending: Emergency Medicine

## 2022-04-10 ENCOUNTER — Encounter: Payer: TRICARE (CHAMPUS) | Primary: Internal Medicine

## 2022-04-10 DIAGNOSIS — T17908A Unspecified foreign body in respiratory tract, part unspecified causing other injury, initial encounter: Secondary | ICD-10-CM

## 2022-04-10 LAB — COMPREHENSIVE METABOLIC PANEL
ALT: 47 U/L (ref 16–61)
AST: 30 U/L (ref 10–38)
Albumin/Globulin Ratio: 1.1 (ref 0.8–1.7)
Albumin: 3.7 g/dL (ref 3.4–5.0)
Alk Phosphatase: 83 U/L (ref 45–117)
Anion Gap: 6 mmol/L (ref 3.0–18)
BUN: 23 MG/DL — ABNORMAL HIGH (ref 7.0–18)
Bun/Cre Ratio: 17 (ref 12–20)
CO2: 30 mmol/L (ref 21–32)
Calcium: 9.7 MG/DL (ref 8.5–10.1)
Chloride: 108 mmol/L (ref 100–111)
Creatinine: 1.37 MG/DL — ABNORMAL HIGH (ref 0.6–1.3)
Est, Glom Filt Rate: 50 mL/min/{1.73_m2} — ABNORMAL LOW (ref >60)
Globulin: 3.4 g/dL (ref 2.0–4.0)
Glucose: 115 mg/dL — ABNORMAL HIGH (ref 74–99)
Potassium: 3.8 mmol/L (ref 3.5–5.5)
Sodium: 144 mmol/L (ref 136–145)
Total Bilirubin: 0.4 MG/DL (ref 0.2–1.0)
Total Protein: 7.1 g/dL (ref 6.4–8.2)

## 2022-04-10 LAB — CBC WITH AUTO DIFFERENTIAL
Absolute Immature Granulocyte: 0 10*3/uL (ref 0.00–0.04)
Basophils %: 0 % (ref 0–2)
Basophils Absolute: 0 10*3/uL (ref 0.0–0.1)
Eosinophils %: 0 % (ref 0–5)
Eosinophils Absolute: 0 10*3/uL (ref 0.0–0.4)
Hematocrit: 36.5 % (ref 36.0–48.0)
Hemoglobin: 12.1 g/dL — ABNORMAL LOW (ref 13.0–16.0)
Immature Granulocytes: 0 % (ref 0.0–0.5)
Lymphocytes %: 18 % — ABNORMAL LOW (ref 21–52)
Lymphocytes Absolute: 1.5 10*3/uL (ref 0.9–3.6)
MCH: 29.7 PG (ref 24.0–34.0)
MCHC: 33.2 g/dL (ref 31.0–37.0)
MCV: 89.7 FL (ref 78.0–100.0)
MPV: 11.4 FL (ref 9.2–11.8)
Monocytes %: 5 % (ref 3–10)
Monocytes Absolute: 0.5 10*3/uL (ref 0.05–1.2)
Neutrophils %: 77 % — ABNORMAL HIGH (ref 40–73)
Neutrophils Absolute: 6.7 10*3/uL (ref 1.8–8.0)
Nucleated RBCs: 0 PER 100 WBC
Platelets: 134 10*3/uL — ABNORMAL LOW (ref 135–420)
RBC: 4.07 M/uL — ABNORMAL LOW (ref 4.35–5.65)
RDW: 14.1 % (ref 11.6–14.5)
WBC: 8.8 10*3/uL (ref 4.6–13.2)
nRBC: 0 10*3/uL (ref 0.00–0.01)

## 2022-04-10 LAB — URINALYSIS
Bilirubin Urine: NEGATIVE
Glucose, UA: NEGATIVE mg/dL
Ketones, Urine: NEGATIVE mg/dL
Leukocyte Esterase, Urine: NEGATIVE
Nitrite, Urine: NEGATIVE
Protein, UA: NEGATIVE mg/dL
Specific Gravity, UA: 1.02 (ref 1.005–1.030)
Urobilinogen, Urine: 0.2 EU/dL (ref 0.2–1.0)
pH, Urine: 7 (ref 5.0–8.0)

## 2022-04-10 LAB — TROPONIN
Troponin, High Sensitivity: 14 ng/L (ref 0–78)
Troponin, High Sensitivity: 13 ng/L (ref 0–78)

## 2022-04-10 LAB — URINALYSIS, MICRO
BACTERIA, URINE: NEGATIVE /hpf
RBC, UA: 11 /hpf (ref 0–5)
WBC, UA: 0 /hpf (ref 0–4)

## 2022-04-10 LAB — LACTIC ACID
Lactic Acid, Plasma: 2.4 MMOL/L (ref 0.4–2.0)
Lactic Acid, Plasma: 2.3 MMOL/L (ref 0.4–2.0)

## 2022-04-10 LAB — LIPASE: Lipase: 234 U/L (ref 73–393)

## 2022-04-10 MED ORDER — IOPAMIDOL 76 % IV SOLN
76 % | Freq: Once | INTRAVENOUS | Status: AC | PRN
Start: 2022-04-10 — End: 2022-04-10
  Administered 2022-04-10: 19:00:00 90 mL via INTRAVENOUS

## 2022-04-10 MED ORDER — SODIUM CHLORIDE 0.9 % IV BOLUS
0.9 % | Freq: Once | INTRAVENOUS | Status: AC
Start: 2022-04-10 — End: 2022-04-10
  Administered 2022-04-10: 18:00:00 1000 mL via INTRAVENOUS

## 2022-04-10 MED ORDER — SODIUM CHLORIDE 0.9 % IV BOLUS
0.9 % | Freq: Once | INTRAVENOUS | Status: DC
Start: 2022-04-10 — End: 2022-04-10

## 2022-04-10 MED ORDER — STERILE WATER FOR INJECTION (MIXTURES ONLY)
1 g | Freq: Once | INTRAMUSCULAR | Status: AC
Start: 2022-04-10 — End: 2022-04-10
  Administered 2022-04-10: 18:00:00 1000 mg via INTRAVENOUS

## 2022-04-10 MED ORDER — CLINDAMYCIN PHOSPHATE IN NACL 600-0.9 MG/50ML-% IV SOLN
INTRAVENOUS | Status: AC
Start: 2022-04-10 — End: 2022-04-10
  Administered 2022-04-10: 18:00:00 600 mg via INTRAVENOUS

## 2022-04-10 MED FILL — CLINDAMYCIN PHOSPHATE IN NACL 600-0.9 MG/50ML-% IV SOLN: INTRAVENOUS | Qty: 50

## 2022-04-10 MED FILL — ISOVUE-370 76 % IV SOLN: 76 % | INTRAVENOUS | Qty: 90

## 2022-04-10 MED FILL — SODIUM CHLORIDE 0.9 % IV SOLN: 0.9 % | INTRAVENOUS | Qty: 1000

## 2022-04-10 MED FILL — CEFTRIAXONE SODIUM 1 G IJ SOLR: 1 g | INTRAMUSCULAR | Qty: 1000

## 2022-04-10 NOTE — ED Provider Notes (Signed)
[facility]  Emergency Department    Pt Name: [place]  MRN: [id number]  Birthdate 1933/06/23  Date of evaluation: 04/10/2022  Provider: Marguriete Wootan Providence LaniusHOWELL, MD    CHIEF COMPLAINT       Chief Complaint   Patient presents with    Chest Pain    Abdominal Pain         HISTORY OF PRESENT ILLNESS   (Location/Symptom, Timing/Onset, Context/Setting, Quality, Duration, Modifying Factors, Severity)  Note limiting factors.   Kyle [place] is a 88 y.o. male with past medical history of dementia, hypertension, hyperlipidemia, seizure disorder who presents to the emergency department due to chest pain as well as epigastric abdominal pain with sudden onset approximately 30 minutes prior to evaluation in the emergency department.  Per EMS, patient found lying on the ground, awake and alert, covered in emesis.  EMS applied nonrebreather in route due to peripheral oxygen saturations in the 80s.  Peripheral oxygen saturation improved to low 90s on nonrebreather.  Patient initially hypotensive in route with EMS to 87 mmHg systolic.  Additional historical features unable to be gathered due to dementia at baseline.    HPI    Nursing Notes were reviewed.    REVIEW OF SYSTEMS    (2-9 systems for level 4, 10 or more for level 5)     Review of Systems   Unable to perform ROS: Dementia     Except as noted above the remainder of the review of systems was reviewed and negative.       PAST MEDICAL HISTORY     Past Medical History:   Diagnosis Date    Constipation     H/O seasonal allergies     History of BPH     Hypercholesterolemia     Hypertension     Memory loss          SURGICAL HISTORY       Past Surgical History:   Procedure Laterality Date    HERNIA REPAIR      ORTHOPEDIC SURGERY      bilateral shoulder replacement    TOTAL KNEE ARTHROPLASTY Left     TUMOR REMOVAL Right     shoulder          CURRENT MEDICATIONS       Previous Medications    AMLODIPINE-BENAZEPRIL (LOTREL) 10-40 MG PER CAPSULE    Take 1 capsule by mouth  daily    ATORVASTATIN (LIPITOR) 40 MG TABLET    Take 1 tablet by mouth daily    DIPHENHYDRAMINE HCL, SLEEP, (ZZZQUIL PO)    Take 15 mLs by mouth nightly as needed (sleep) Sleep    DONEPEZIL (ARICEPT) 5 MG TABLET    Take 1 tablet by mouth nightly    FINASTERIDE (PROSCAR) 5 MG TABLET    Take 1 tablet by mouth daily    LEVETIRACETAM (KEPPRA) 750 MG TABLET    Take 1 tablet by mouth 2 times daily    LORATADINE (CLARITIN PO)    Take by mouth    MELATONIN PO    Take 6 mg by mouth daily    MEMANTINE (NAMENDA) 5 MG TABLET    Take 1 tab by mouth twice daily.    MISC NATURAL PRODUCTS (PUMPKIN SEED OIL) CAPS    Take 1,000 mg by mouth daily.    POLYETHYLENE GLYCOL (GLYCOLAX) 17 GM/SCOOP POWDER    Take 17 g by mouth daily Mix with 8 oz liquid of choice  SENNOSIDES (SENNA) 8.8 MG/5ML LIQD    Take  by mouth daily. 1 teaspoon daily    SERTRALINE (ZOLOFT) 25 MG TABLET    Take 1 tab by mouth daily. In 1 week can increase to 2 tabs daily.    SOLIFENACIN (VESICARE) 5 MG TABLET    Take 1 tablet by mouth daily    ZN-PYG AFRI-NETTLE-SAW PALMET (SAW PALMETTO COMPLEX PO)    Take 2 tablets by mouth daily.       ALLERGIES     Patient has no known allergies.    FAMILY HISTORY       Family History   Problem Relation Age of Onset    Cancer Mother         ovarian    Cancer Father         stomach    Heart Disease Maternal Grandmother           SOCIAL HISTORY       Social History     Socioeconomic History    Marital status: Married   Tobacco Use    Smoking status: Former    Smokeless tobacco: Former   Substance and Sexual Activity    Alcohol use: No    Drug use: No       SCREENINGS         Glasgow Coma Scale  Eye Opening: Spontaneous  Best Verbal Response: Confused  Best Motor Response: Withdraws from pain  Glasgow Coma Scale Score: 12                                     PHYSICAL EXAM    (up to 7 for level 4, 8 or more for level 5)     ED Triage Vitals [04/10/22 1248]   BP Temp Temp src Pulse Respirations SpO2 Height Weight   110/77 -- -- 75 16 91  % -- --       Physical Exam  Constitutional:       General: He is not in acute distress.     Appearance: He is not ill-appearing or toxic-appearing.   HENT:      Head: Normocephalic and atraumatic.      Mouth/Throat:      Mouth: Mucous membranes are moist.      Pharynx: Oropharynx is clear.   Eyes:      Extraocular Movements: Extraocular movements intact.      Pupils: Pupils are equal, round, and reactive to light.   Cardiovascular:      Rate and Rhythm: Normal rate and regular rhythm.      Heart sounds: Normal heart sounds. No murmur heard.    No friction rub. No gallop.   Pulmonary:      Breath sounds: Examination of the right-upper field reveals decreased breath sounds. Examination of the left-upper field reveals decreased breath sounds. Examination of the right-middle field reveals decreased breath sounds. Examination of the left-middle field reveals decreased breath sounds. Examination of the right-lower field reveals decreased breath sounds. Examination of the left-lower field reveals decreased breath sounds. Decreased breath sounds present.   Abdominal:      Tenderness: There is abdominal tenderness in the epigastric area and periumbilical area. There is guarding (Voluntary). There is no rebound.   Musculoskeletal:         General: No tenderness.      Right lower leg: No edema.  Left lower leg: No edema.   Skin:     General: Skin is warm and dry.      Capillary Refill: Capillary refill takes less than 2 seconds.   Neurological:      Mental Status: Mental status is at baseline.       DIAGNOSTIC RESULTS     EKG: All EKG's are interpreted by the Emergency Department Physician who either signs or Co-signs this chart in the absence of a cardiologist.    EKG 12:54 PM interpreted by me demonstrates atrial fibrillation with ventricular rate 59 bpm.  Right bundle branch block.  Left anterior fascicular block.  T wave inversions inferiorly and laterally also seen on prior 12/28/2020.    RADIOLOGY:   Non-plain film  images such as CT, Ultrasound and MRI are read by the radiologist. Plain radiographic images are visualized and preliminarily interpreted by the emergency physician with the below findings:    No evidence of consolidation, effusion.    Interpretation per the Radiologist below, if available at the time of this note:    CT Head W/O Contrast   Final Result   No acute abnormalities      CTA CHEST ABDOMEN PELVIS W CONTRAST   Final Result   1. No pulmonary embolism.   2. Ectatic aorta with no focal aneurysm or aortic dissection.   3. No proximal mesenteric arterial occlusion or venous occlusion.   4. Bilateral posterior basilar infiltrate in both lungs, posterior right upper   lobe and right middle infiltrate. Aspiration possible.   5. Nonspecific mucosal thickening in the ascending colon, fluid and fecal   material mixture in the ascending colon. Colitis not excluded. Severe fecal   retention in the rectosigmoid colon. Fecal impaction also possible   6. Enlarged prostate, clinical correlation? Bladder wall thickening, chronic   cystitis or outlet obstruction?            XR CHEST 1 VIEW   Final Result   No radiographic evidence of acute cardiopulmonary process.             ED BEDSIDE ULTRASOUND:   Performed by ED Physician - none    LABS:  Labs Reviewed   CBC WITH AUTO DIFFERENTIAL - Abnormal; Notable for the following components:       Result Value    RBC 4.07 (*)     Hemoglobin 12.1 (*)     Platelets 134 (*)     Neutrophils % 77 (*)     Lymphocytes % 18 (*)     All other components within normal limits   COMPREHENSIVE METABOLIC PANEL - Abnormal; Notable for the following components:    Glucose 115 (*)     BUN 23 (*)     Creatinine 1.37 (*)     Est, Glom Filt Rate 50 (*)     All other components within normal limits   LACTIC ACID - Abnormal; Notable for the following components:    Lactic Acid, Plasma 2.4 (*)     All other components within normal limits   URINALYSIS - Abnormal; Notable for the following components:     Blood, Urine MODERATE (*)     All other components within normal limits   LACTIC ACID - Abnormal; Notable for the following components:    Lactic Acid, Plasma 2.3 (*)     All other components within normal limits   CULTURE, BLOOD 1   CULTURE, BLOOD 2   LIPASE   TROPONIN   TROPONIN  URINALYSIS, MICRO       All other labs were within normal range or not returned as of this dictation.    EMERGENCY DEPARTMENT COURSE and DIFFERENTIAL DIAGNOSIS/MDM:   Vitals:    Vitals:    04/10/22 1248 04/10/22 1750 04/10/22 1759   BP: 110/77  (!) 144/84   Pulse: 75  78   Resp: 16  17   Temp:  98.5 F (36.9 C)    TempSrc:  Oral    SpO2: 91%  98%           Medical Decision Making  Amount and/or Complexity of Data Reviewed  Labs: ordered. Decision-making details documented in ED Course.  Radiology: ordered.  ECG/medicine tests: ordered.    Risk  Prescription drug management.    88 year old male with past medical history of dementia, hypertension, hyperlipidemia, seizure disorder who presents to the emergency department due to chest pain and epigastric abdominal pain with sudden onset approximately 12:30 PM today.  Patient reportedly had an episode of emesis and was found supine on the ground by EMS.  On exam, patient awake, alert and oriented at reported baseline, in no acute distress.  Patient is afebrilepatient is hemodynamically stable with blood pressure 110/77, heart rate 75 bpm.  Patient remains hypoxemic with peripheral oxygen saturations of 91% on 4 L via nasal cannula.  Breath sounds are decreased in bilateral lung fields.  Heart is irregularly irregular with rate of approximately 60 bpm.  There is epigastric and periumbilical abdominal tenderness to palpation with voluntary guarding, no rebound.  There are no peritoneal signs.  There is no appreciable lower extremity edema.  Considered ACS, aortic catastrophe, PE, aspiration pneumonia, mesenteric ischemia, intracranial hemorrhage, among others.  We will complete CBC, CMP,  lipase, lactate, troponin, blood culture x2, chest x-ray, EKG.  We will also complete CTA chest/abdomen/pelvis, as well as CT head noncontrast.   Due to likely aspiration, antibiosis with clindamycin and Rocephin.  Patient declines symptomatic treatment.    EKG 12:54 PM interpreted by me demonstrates atrial fibrillation with ventricular rate 59 bpm.  Right bundle branch block.  Left anterior fascicular block.  T wave inversions inferiorly and laterally also seen on prior 12/28/2020.  There are no acute cardiopulmonary abnormalities on chest x-ray.    On CBC, there is stable anemia to hemoglobin 12.1, no leukocytosis.  On CMP, there are no actionable electrolyte abnormalities.  Renal function at baseline with creatinine elevation to 1.37.  Initial lactate elevated to 2.4 with repeat of 2.3 after cautious fluid bolus due to depressed ejection fraction.  Suspect clearance limited due to patient's kidney disease. Initial troponin not elevated above threshold for positivity at 14 with repeat troponin not reaching clinically significant change at 13.  There is no evidence of infection on urinalysis.  CT head noncontrast was without acute intracranial abnormalities.  On CTA chest, there is likely evidence of aspiration with basilar infiltrates.  There are no additional acute abnormalities on CTA chest/abdomen/pelvis as the suspected etiology of patient's presentation today.  On reevaluation, patient states he now feels he is at his baseline.  Patient's daughter, who is at bedside, also agrees patient appears to be interacting at his baseline status.  Patient is hemodynamically stable, afebrile, with appropriate respiratory effort and peripheral oxygen saturation on room air.  Discussed with the patient's daughter the results of today's evaluation.  Advised patient's daughter to follow-up with the patient's primary care provider for further management.  Patient's daughter verbalized understanding and expressed agreement with  this treatment plan.  Patient's daughter verbalized understanding and expressed agreement with discharge instructions and return precautions.  All questions answered.  Patient discharged in stable condition.    Tiffany Kocher, MD  PGY-2, Emergency Medicine  [facility]      REASSESSMENT     ED Course as of 04/10/22 1906   Tue Apr 10, 2022   1356 Creatinine(!): 1.37  At baseline. [AH]   1356 Hemoglobin Quant(!): 12.1  Stable, at baseline. [AH]   1841 Leukocyte Esterase, Urine: Negative [CT]      ED Course User Index  [AH] Tiffany Kocher, MD  [CT] Teodoro Kil, MD         CONSULTS:  None    PROCEDURES:  Unless otherwise noted below, none     Procedures        FINAL IMPRESSION      1. Aspiration into respiratory tract, initial encounter    2. Abdominal pain, epigastric    3. Chest pain, unspecified type          DISPOSITION/PLAN   DISPOSITION Decision To Discharge 04/10/2022 06:58:54 PM      PATIENT REFERRED TO:  Cleon Gustin, MD  [address]  [address]  [place] [address]  [phone]    Schedule an appointment as soon as possible for a visit         DISCHARGE MEDICATIONS:  New Prescriptions    No medications on file     Controlled Substances Monitoring:     No flowsheet data found.    (Please note that portions of this note were completed with a voice recognition program.  Efforts were made to edit the dictations but occasionally words are mis-transcribed.)    Philmore Lepore Providence Lanius, MD (electronically signed)    Tiffany Kocher, MD  PGY-2, Emergency Medicine  [facility], MD  Resident  04/10/22 [phone]

## 2022-04-10 NOTE — ED Notes (Signed)
Pt discharged and transferred to w/c and vehicle with assistance x 2. Pt not bearing weight very well.      Wallace Going, RN  04/10/22 2010

## 2022-04-10 NOTE — ED Triage Notes (Signed)
Pt. Arrives via EMS endorsing epigastric abdominal pain and chest pain. Pt. Was placed on non rebreather due to low o2 levels. Pt. Noted to have hx of dementia. BGL 126 via medics.

## 2022-04-10 NOTE — Discharge Instructions (Addendum)
Please follow-up with your primary care provider for further management.    Please ensure you drink plenty of fluids, as well as maintain oral intake.  Caution is advised during eating or drinking in an attempt to prevent aspiration of food or drink into the lungs.    Please return to the emergency department for continued or worsening symptoms, continued worsening chest pain, continued worsening abdominal pain, persistent nausea/vomiting, persistent fever, or any additional new or concerning symptoms.

## 2022-04-11 LAB — EKG 12-LEAD
Atrial Rate: 52 {beats}/min
Q-T Interval: 506 ms
QRS Duration: 144 ms
QTc Calculation (Bazett): 500 ms
R Axis: -67 degrees
T Axis: -59 degrees
Ventricular Rate: 59 {beats}/min

## 2022-04-13 ENCOUNTER — Encounter: Payer: TRICARE (CHAMPUS) | Primary: Internal Medicine

## 2022-04-16 LAB — CULTURE, BLOOD 1: Culture: NO GROWTH

## 2022-04-16 LAB — CULTURE, BLOOD 2: Culture: NO GROWTH

## 2022-04-18 ENCOUNTER — Telehealth: Admit: 2022-04-18 | Discharge: 2022-04-18 | Payer: MEDICARE | Attending: Internal Medicine | Primary: Internal Medicine

## 2022-04-18 ENCOUNTER — Encounter: Attending: Internal Medicine | Primary: Internal Medicine

## 2022-04-18 DIAGNOSIS — Z0000 Encounter for general adult medical examination without abnormal findings: Secondary | ICD-10-CM

## 2022-04-18 NOTE — Progress Notes (Signed)
Medicare Annual Wellness Visit    Kyle [place] is here for [employer] AWV    Assessment & Plan   Medicare annual wellness visit, subsequent  -     [facility] -  Referral to ACP Clinical Specialist  Dementia without behavioral disturbance (HCC)  -     [facility] -  Referral to ACP Clinical Specialist  Stage 3a chronic kidney disease (HCC)    Recommendations for Preventive Services Due: see orders and patient instructions/AVS.  Recommended screening schedule for the next 5-10 years is provided to the patient in written form: see Patient Instructions/AVS.    Patient recently saw his neurologist and was started on Zoloft.  Daughter reports that his mood has improved and he is sleeping better.    Patient recently visited the ED -he was found to be dehydrated and improved with IV fluids.  Encouraged increased fluid intake.  Renal function is stable.    Patient has been prescribed glasses and hearing aids but is noncompliant.       Return in about 4 months (around 08/19/2022).     Subjective   The following acute and/or chronic problems were also addressed today:  Patient has no active complaints today.    Patient's complete Health Risk Assessment and screening values have been reviewed and are found in Flowsheets. The following problems were reviewed today and where indicated follow up appointments were made and/or referrals ordered.    Positive Risk Factor Screenings with Interventions:    Fall Risk:  Do you feel unsteady or are you worried about falling? : (!) yes  2 or more falls in past year?: (!) yes  Fall with injury in past year?: no     Interventions:    See AVS for additional education material            General HRA Questions:  Select all that apply: (!) New or Increased Pain    Pain Interventions:  Advised to take Tylenol. Denied pain while on the call        Dentist Screen:  Have you seen the dentist within the past year?: (!) No    Intervention:  Patient declines any further evaluation or treatment    Hearing  Screen:  Do you or your family notice any trouble with your hearing that hasn't been managed with hearing aids?: (!) Yes    Interventions:  Has hearing aids but is non-compliant    Vision Screen:  Do you have difficulty driving, watching TV, or doing any of your daily activities because of your eyesight?: No  Have you had an eye exam within the past year?: (!) No  No results found.    Interventions:   Patient encouraged to make appointment with their eye specialist     ADL's:   Patient reports needing help with:  Select all that apply: (!) Eating, Dressing, Grooming, Bathing, Toileting, Walking/Balance  Select all that apply: [employer], Housekeeping, Banking/Finances, Presenter, broadcasting, Transportation, Taking Medications  Interventions:  Patient has dementia and needs help with all ADLs. His daughter and son are primary caregivers    Advanced Directives:  Do you have a Living Will?: (!) No    Intervention:  has NO advanced directive  - referred to ACP Coordinator                                Objective      Patient-Reported Vitals  No  data recorded          No Known Allergies  Prior to Visit Medications    Medication Sig Taking? Authorizing Provider   finasteride (PROSCAR) 5 MG tablet Take 1 tablet by mouth daily Yes Stephanie Littman R Ksean Vale, MD   amLODIPine-benazepril (LOTREL) 10-40 MG per capsule Take 1 capsule by mouth daily Yes Cleon Gustin, MD   Misc Natural Products (PUMPKIN SEED OIL) CAPS Take 1,000 mg by mouth daily. Yes Historical Provider, MD   Zn-Pyg Afri-Nettle-Saw Palmet (SAW PALMETTO COMPLEX PO) Take 2 tablets by mouth daily. Yes Historical Provider, MD   Sennosides (SENNA) 8.8 MG/5ML LIQD Take  by mouth daily. 1 teaspoon daily Yes Historical Provider, MD   sertraline (ZOLOFT) 25 MG tablet Take 1 tab by mouth daily. In 1 week can increase to 2 tabs daily. Yes Maurine Simmering, APRN - NP   levETIRAcetam (KEPPRA) 750 MG tablet Take 1 tablet by mouth 2 times daily Yes Maurine Simmering, APRN - NP   donepezil  (ARICEPT) 5 MG tablet Take 1 tablet by mouth nightly Yes Maurine Simmering, APRN - NP   memantine (NAMENDA) 5 MG tablet Take 1 tab by mouth twice daily. Yes Maurine Simmering, APRN - NP   Loratadine (CLARITIN PO) Take by mouth Yes Historical Provider, MD   diphenhydrAMINE HCl, Sleep, (ZZZQUIL PO) Take 15 mLs by mouth nightly as needed (sleep) Sleep Yes Historical Provider, MD   solifenacin (VESICARE) 5 MG tablet Take 1 tablet by mouth daily Yes Historical Provider, MD   atorvastatin (LIPITOR) 40 MG tablet Take 1 tablet by mouth daily Yes Cleon Gustin, MD   MELATONIN PO Take 6 mg by mouth daily Yes Ar Automatic Reconciliation   polyethylene glycol (GLYCOLAX) 17 GM/SCOOP powder Take 17 g by mouth daily Mix with 8 oz liquid of choice Yes Ar Automatic Reconciliation       CareTeam (Including outside providers/suppliers regularly involved in providing care):   Patient Care Team:  Cleon Gustin, MD as PCP - General  Cleon Gustin, MD as PCP - Empaneled Provider  Ethlyn Gallery, MD as Physician  Vernie Murders, MD as Consulting Physician     Reviewed and updated this visit:  Tobacco  Allergies  Meds  Problems  Med Hx  Surg Hx  Soc Hx  Fam Hx           Jaqua Ching, was evaluated through a synchronous (real-time) audio-video encounter. The patient (or guardian if applicable) is aware that this is a billable service, which includes applicable co-pays. This Virtual Visit was conducted with patient's (and/or legal guardian's) consent. Patient identification was verified, and a caregiver was present when appropriate.   The patient was located at Home: [address]  [place] [address]  Provider was located at [employer] (Appt Dept): [address]  [address]  [place],  [place] [address]        Cleon Gustin, MD

## 2022-04-18 NOTE — Patient Instructions (Signed)
Preventing Falls: Care Instructions  Overview     Getting around your home safely can be a challenge if you have injuries or health problems that make it easy for you to fall. Loose rugs and furniture in walkways are among the dangers for many older people who have problems walking or who have poor eyesight. People who have conditions such as arthritis, osteoporosis, or dementia also have to be careful not to fall.  You can make your home safer with a few simple measures.  Follow-up care is a key part of your treatment and safety. Be sure to make and go to all appointments, and call your doctor if you are having problems. It's also a good idea to know your test results and keep a list of the medicines you take.  How can you care for yourself at home?  Taking care of yourself  Exercise regularly to improve your strength, muscle tone, and balance. Walk if you can. Swimming may be a good choice if you cannot walk easily.  Have your vision and hearing checked each year or any time you notice a change. If you have trouble seeing and hearing, you might not be able to avoid objects and could lose your balance.  Know the side effects of the medicines you take. Ask your doctor or pharmacist whether the medicines you take can affect your balance. Sleeping pills or sedatives can affect your balance.  Limit the amount of alcohol you drink. Alcohol can impair your balance and other senses.  Ask your doctor whether calluses or corns on your feet need to be removed. If you wear loose-fitting shoes because of calluses or corns, you can lose your balance and fall.  Talk to your doctor if you have numbness in your feet.  You may get dizzy if you do not drink enough water. To prevent dehydration, drink plenty of fluids. Choose water and other clear liquids. If you have kidney, heart, or liver disease and have to limit fluids, talk with your doctor before you increase the amount of fluids you drink.  Preventing falls at  home  Remove raised doorway thresholds, throw rugs, and clutter. Repair loose carpet or raised areas in the floor.  Move furniture and electrical cords to keep them out of walking paths.  Use nonskid floor wax, and wipe up spills right away, especially on ceramic tile floors.  If you use a walker or cane, put rubber tips on it. If you use crutches, clean the bottoms of them regularly with an abrasive pad, such as steel wool.  Keep your house well lit, especially stairways, porches, and outside walkways. Use night-lights in areas such as hallways and bathrooms. Add extra light switches or use remote switches (such as switches that go on or off when you clap your hands) to make it easier to turn lights on if you have to get up during the night.  Install sturdy handrails on stairways.  Move items in your cabinets so that the things you use a lot are on the lower shelves (about waist level).  Keep a cordless phone and a flashlight with new batteries by your bed. If possible, put a phone in each of the main rooms of your house, or carry a cell phone in case you fall and cannot reach a phone. Or, you can wear a device around your neck or wrist. You push a button that sends a signal for help.  Wear low-heeled shoes that fit well and give your feet  good support. Use footwear with nonskid soles. Check the heels and soles of your shoes for wear. Repair or replace worn heels or soles.  Do not wear socks without shoes on smooth floors, such as wood.  Walk on the grass when the sidewalks are slippery. If you live in an area that gets snow and ice in the winter, sprinkle salt on slippery steps and sidewalks. Or ask a family member or friend to do this for you.  Preventing falls in the bath  Install grab bars and nonskid mats inside and outside your shower or tub and near the toilet and sinks.  Use shower chairs and bath benches.  Use a hand-held shower head that will allow you to sit while showering.  Get into a tub or shower by  putting the weaker leg in first. Get out of a tub or shower with your strong side first.  Repair loose toilet seats and consider installing a raised toilet seat to make getting on and off the toilet easier.  Keep your bathroom door unlocked while you are in the shower.  Where can you learn more?  Go to https://www.bennett.info/ and enter G117 to learn more about "Preventing Falls: Care Instructions."  Current as of: November 9, 2022Content Version: 13.6   2006-2023 [employer], [employer].   Care instructions adapted under license by [facility]. If you have questions about a medical condition or this instruction, always ask your healthcare professional. [place] any warranty or liability for your use of this information.           Learning About Dental Care for Older Adults  Dental care for older adults: Overview  Dental care for older people is much the same as for younger adults. But older adults do have concerns that younger adults do not. Older adults may have problems with gum disease and decay on the roots of their teeth. They may need missing teeth replaced or broken fillings fixed. Or they may have dentures that need to be cared for. Some older adults may have trouble holding a toothbrush.  You can help remind the person you are caring for to brush and floss their teeth or to clean their dentures. In some cases, you may need to do the brushing and other dental care tasks. People who have trouble using their hands or who have dementia may need this extra help.  How can you help with dental care?  Normal dental care  To keep the teeth and gums healthy:  Brush the teeth with fluoride toothpaste twice a day--in the morning and at night--and floss at least once a day. Plaque can quickly build up on the teeth of older adults.  Watch for the signs of gum disease. These signs include gums that bleed after brushing or after eating hard foods, such as  apples.  See a dentist regularly. Many experts recommend checkups every 6 months.  Keep the dentist up to date on any new medications the person is taking.  Encourage a balanced diet that includes whole grains, vegetables, and fruits, and that is low in saturated fat and sodium.  Encourage the person you're caring for not to use tobacco products. They can affect dental and general health.  Many older adults have a fixed income and feel that they can't afford dental care. But most towns and cities have programs in which dentists help older adults by lowering fees. Contact your area's public health offices or social services for information about dental care in  your area.  Using a toothbrush  Older adults with arthritis sometimes have trouble brushing their teeth because they can't easily hold the toothbrush. Their hands and fingers may be stiff, painful, or weak. If this is the case, you can:  Offer an IT trainer toothbrush.  Enlarge the handle of a non-electric toothbrush by wrapping a sponge, an elastic bandage, or adhesive tape around it.  Push the toothbrush handle through a [place] made of rubber or soft foam.  Make the handle longer and thicker by taping Popsicle sticks or tongue depressors to it.  You may also be able to buy special toothbrushes, toothpaste dispensers, and floss holders.  Your doctor may recommend a soft-bristle toothbrush if the person you care for bleeds easily. Bleeding can happen because of a health problem or from certain medicines.  A toothpaste for sensitive teeth may help if the person you care for has sensitive teeth.  How do you brush and floss someone's teeth?  If the person you are caring for has a hard time cleaning their teeth on their own, you may need to brush and floss their teeth for them. It may be easiest to have the person sit and face away from you, and to sit or stand behind them. That way you can steady their head against your arm as you reach around to floss and brush their  teeth. Choose a place that has good lighting and is comfortable for both of you.  Before you begin, gather your supplies. You will need gloves, floss, a toothbrush, and a container to hold water if you are not near a sink. Wash and dry your hands well and put on gloves. Start by flossing:  Gently work a piece of floss between each of the teeth toward the gums. A plastic flossing tool may make this easier, and they are available at most drugstores.  Curve the floss around each tooth into a U-shape and gently slide it under the gum line.  Move the floss firmly up and down several times to scrape off the plaque.  After you've finished flossing, throw away the used floss and begin brushing:  Wet the brush and apply toothpaste.  Place the brush at a 45-degree angle where the teeth meet the gums. Press firmly, and move the brush in small circles over the surface of the teeth.  Be careful not to brush too hard. Vigorous brushing can make the gums pull away from the teeth and can scratch the tooth enamel.  Brush all surfaces of the teeth, on the tongue side and on the cheek side. Pay special attention to the front teeth and all surfaces of the back teeth.  Brush chewing surfaces with short back-and-forth strokes.  After you've finished, help the person rinse the remaining toothpaste from their mouth.  Where can you learn more?  Go to https://www.bennett.info/ and enter F944 to learn more about "[place] for Older Adults."  Current as of: November 14, 2022Content Version: 13.6   2006-2023 Health[employer], Incorporated.   Care instructions adapted under license by [facility]. If you have questions about a medical condition or this instruction, always ask your healthcare professional. [place] any warranty or liability for your use of this information.           Hearing Loss: Care Instructions  Overview     Hearing loss is a sudden or slow decrease in how well  you hear. It can range from mild to severe. Permanent hearing loss  can occur with aging. It also can happen when you are exposed long-term to loud noise. Examples include listening to loud music, riding motorcycles, or being around other loud machines.  Hearing loss can affect your work and home life. It can make you feel lonely or depressed. You may feel that you have lost your independence. But hearing aids and other devices can help you hear better and feel connected to others.  Follow-up care is a key part of your treatment and safety. Be sure to make and go to all appointments, and call your doctor if you are having problems. It's also a good idea to know your test results and keep a list of the medicines you take.  How can you care for yourself at home?  Avoid loud noises whenever possible. This helps keep your hearing from getting worse.  Always wear hearing protection around loud noises.  Wear a hearing aid as directed. See a professional who can help you pick a hearing aid that fits you.  Have hearing tests as your doctor suggests. They can show whether your hearing has changed. Your hearing aid may need to be adjusted.  Use other devices as needed. These may include:  Telephone amplifiers and hearing aids that can connect to a television, stereo, radio, or microphone.  Devices that use lights or vibrations. These alert you to the doorbell, a ringing telephone, or a baby monitor.  Television closed-captioning. This shows the words at the bottom of the screen. Most new TVs can do this.  TTY (text telephone). This lets you type messages back and forth on the telephone instead of talking or listening. These devices are also called TDD. When messages are typed on the keyboard, they are sent over the phone line to a receiving TTY. The message is shown on a monitor.  Use text messaging, social media, and email if it is hard for you to communicate by telephone.  Try to learn a listening technique called  speechreading. It is not lipreading. You pay attention to people's gestures, expressions, posture, and tone of voice. These clues can help you understand what a person is saying. Face the person you are talking to, and have them face you. Make sure the lighting is good. You need to see the other person's face clearly.  Think about counseling if you need help to adjust to your hearing loss.  When should you call for help?  Watch closely for changes in your health, and be sure to contact your doctor if:   You think your hearing is getting worse.    You have new symptoms, such as dizziness or nausea.   Where can you learn more?  Go to https://www.bennett.info/ and enter R798 to learn more about "Hearing Loss: Care Instructions."  Current as of: May 4, 2022Content Version: 13.6   2006-2023 Healthwise, Incorporated.   Care instructions adapted under license by [facility]. If you have questions about a medical condition or this instruction, always ask your healthcare professional. [place] any warranty or liability for your use of this information.           Learning About Vision Tests  What are vision tests?     The four most common vision tests are visual acuity tests, refraction, visual field tests, and color vision tests.  Visual acuity (sharpness) tests  These tests are used:  To see if you need glasses or contact lenses.  To monitor an eye problem.  To check an eye  injury.  Visual acuity tests are done as part of routine exams. You may also have this test when you get your driver's license or apply for some types of jobs.  Visual field tests  These tests are used:  To check for vision loss in any area of your range of vision.  To screen for certain eye diseases.  To look for nerve damage after a stroke, head injury, or other problem that could reduce blood flow to the brain.  Refraction and color tests  A refraction test is done to find the right prescription  for glasses and contact lenses.  A color vision test is done to check for color blindness.  Color vision is often tested as part of a routine exam. You may also have this test when you apply for a job where recognizing different colors is important, such as truck driving, Research officer, trade union, or the [employer].  How are vision tests done?  Visual acuity test   You cover one eye at a time.  You read aloud from a wall chart across the room.  You read aloud from a small card that you hold in your hand.  Refraction   You look into a special device.  The device puts lenses of different strengths in front of each eye to see how strong your glasses or contact lenses need to be.  Visual field tests   Your doctor may have you look through special machines.  Or your doctor may simply have you stare straight ahead while they move a finger into and out of your field of vision.  Color vision test   You look at pieces of printed test patterns in various colors. You say what number or symbol you see.  Your doctor may have you trace the number or symbol using a pointer.  How do these tests feel?  There is very little chance of having a problem from this test. If dilating drops are used for a vision test, they may make the eyes sting and cause a medicine taste in the mouth.  Follow-up care is a key part of your treatment and safety. Be sure to make and go to all appointments, and call your doctor if you are having problems. It's also a good idea to know your test results and keep a list of the medicines you take.  Where can you learn more?  Go to https://www.bennett.info/ and enter G551 to learn more about "Learning About Vision Tests."  Current as of: October 12, 2022Content Version: 13.6   2006-2023 Healthwise, Incorporated.   Care instructions adapted under license by [facility]. If you have questions about a medical condition or this instruction, always ask your healthcare professional. [place] any warranty or liability for your use of this information.           Learning About Activities of Daily Living  What are activities of daily living?     Activities of daily living (ADLs) are the basic self-care tasks you do every day. As you age, and if you have health problems, you may find that it's harder to do these things for yourself. That's when you may need some help.  Your doctor uses ADLs to measure how much help you need. Knowing what you can and can't do for yourself is an important first step to getting help. And when you have the help you need, you can stay as independent as possible.  Your doctor will want to know if you  are able to do tasks such as:  Take a bath or shower without help.  Go to the bathroom by yourself.  Dress and undress without help.  Shave, comb your hair, and brush teeth on your own.  Get in and out of bed or a chair without help.  Feed yourself without help.  If you are having trouble doing basic self-care tasks, talk with your doctor. You may want to bring a caregiver or family member who can help the doctor understand your needs and abilities.  How will a doctor assess your ADLs?  Asking about ADLs is part of a routine health checkup your doctor will likely do as you age. Your health check might be done in a doctor's office, in your home, or at a hospital. The goal is to find out if you are having any problems that could make your health problems worse or that make it unsafe for you to be on your own.  To measure your ADLs, your doctor will ask how hard it is for you to do routine tasks. He or she may also want to know if you have changed the way you do a task because of a health problem. He or she may watch how you:  Walk back and forth.  Keep your balance while you stand or walk.  Move from sitting to standing or from a bed to a chair.  Button or unbutton a Printmaker.  Remove and put on your shoes.  It's normal to feel a little worried or anxious  if you find you can't do all the things you used to be able to do. Talking with your doctor about ADLs isn't a test that you either pass or fail. It's just a way to get more information about your health and safety.  Follow-up care is a key part of your treatment and safety. Be sure to make and go to all appointments, and call your doctor if you are having problems. It's also a good idea to know your test results and keep a list of the medicines you take.  Current as of: June 6, 2022Content Version: 13.6   2006-2023 Healthwise, Incorporated.   Care instructions adapted under license by [facility] If you have questions about a medical condition or this instruction, always ask your healthcare professional. [place] any warranty or liability for your use of this information.           Advance Directives: Care Instructions  Overview  An advance directive is a legal way to state your wishes at the end of your life. It tells your family and your doctor what to do if you can't say what you want.  There are two main types of advance directives. You can change them any time your wishes change.  Living will.  This form tells your family and your doctor your wishes about life support and other treatment. The form is also called a declaration.  Medical power of attorney.  This form lets you name a person to make treatment decisions for you when you can't speak for yourself. This person is called a health care agent (health care proxy, health care surrogate). The form is also called a durable power of attorney for health care.  If you do not have an advance directive, decisions about your medical care may be made by a family member, or by a doctor or a judge who doesn't know you.  It may help to think of  an advance directive as a gift to the people who care for you. If you have one, they won't have to make tough decisions by themselves.  For more information, including forms for your  state, see the [place] website ([email or web address]).  Follow-up care is a key part of your treatment and safety. Be sure to make and go to all appointments, and call your doctor if you are having problems. It's also a good idea to know your test results and keep a list of the medicines you take.  What should you include in an advance directive?  Many states have a unique advance directive form. (It may ask you to address specific issues.) Or you might use a universal form that's approved by many states.  If your form doesn't tell you what to address, it may be hard to know what to include in your advance directive. Use the questions below to help you get started.  Who do you want to make decisions about your medical care if you are not able to?  What life-support measures do you want if you have a serious illness that gets worse over time or can't be cured?  What are you most afraid of that might happen? (Maybe you're afraid of having pain, losing your independence, or being kept alive by machines.)  Where would you prefer to die? (Your home? A hospital? A nursing home?)  Do you want to donate your organs when you die?  Do you want certain religious practices performed before you die?  When should you call for help?  Be sure to contact your doctor if you have any questions.  Where can you learn more?  Go to https://www.bennett.info/ and enter R264 to learn more about "Advance Directives: Care Instructions."  Current as of: June 16, 2022Content Version: 13.6   2006-2023 Healthwise, Incorporated.   Care instructions adapted under license by [facility]. If you have questions about a medical condition or this instruction, always ask your healthcare professional. [place] any warranty or liability for your use of this information.           A Healthy Heart: Care Instructions  Your Care Instructions     Coronary artery disease, also  called heart disease, occurs when a substance called plaque builds up in the vessels that supply oxygen-rich blood to your heart muscle. This can narrow the blood vessels and reduce blood flow. A heart attack happens when blood flow is completely blocked. A high-fat diet, smoking, and other factors increase the risk of heart disease.  Your doctor has found that you have a chance of having heart disease. You can do lots of things to keep your heart healthy. It may not be easy, but you can change your diet, exercise more, and quit smoking. These steps really work to lower your chance of heart disease.  Follow-up care is a key part of your treatment and safety. Be sure to make and go to all appointments, and call your doctor if you are having problems. It's also a good idea to know your test results and keep a list of the medicines you take.  How can you care for yourself at home?  Diet   Use less salt when you cook and eat. This helps lower your blood pressure. Taste food before salting. Add only a little salt when you think you need it. With time, your taste buds will adjust to less salt.    Eat fewer snack items, fast foods, canned  soups, and other high-salt, high-fat, processed foods.    Read food labels and try to avoid saturated and trans fats. They increase your risk of heart disease by raising cholesterol levels.    Limit the amount of solid fat-butter, margarine, and shortening-you eat. Use olive, peanut, or canola oil when you cook. Bake, broil, and steam foods instead of frying them.    Eat a variety of fruit and vegetables every day. Dark green, deep orange, red, or yellow fruits and vegetables are especially good for you. Examples include spinach, carrots, peaches, and berries.    Foods high in fiber can reduce your cholesterol and provide important vitamins and minerals. High-fiber foods include whole-grain cereals and breads, oatmeal, beans, brown rice, citrus fruits, and apples.    Eat lean  proteins. Heart-healthy proteins include seafood, lean meats and poultry, eggs, beans, peas, nuts, seeds, and soy products.    Limit drinks and foods with added sugar. These include candy, desserts, and soda pop.   Lifestyle changes   If your doctor recommends it, get more exercise. Walking is a good choice. Bit by bit, increase the amount you walk every day. Try for at least 30 minutes on most days of the week. You also may want to swim, bike, or do other activities.    Do not smoke. If you need help quitting, talk to your doctor about stop-smoking programs and medicines. These can increase your chances of quitting for good. Quitting smoking may be the most important step you can take to protect your heart. It is never too late to quit.    Limit alcohol to 2 drinks a day for men and 1 drink a day for women. Too much alcohol can cause health problems.    Manage other health problems such as diabetes, high blood pressure, and high cholesterol. If you think you may have a problem with alcohol or drug use, talk to your doctor.   Medicines   Take your medicines exactly as prescribed. Call your doctor if you think you are having a problem with your medicine.    If your doctor recommends aspirin, take the amount directed each day. Make sure you take aspirin and not another kind of pain reliever, such as acetaminophen (Tylenol).   When should you call for help?   Call 911 if you have symptoms of a heart attack. These may include:   Chest pain or pressure, or a strange feeling in the chest.    Sweating.    Shortness of breath.    Pain, pressure, or a strange feeling in the back, neck, jaw, or upper belly or in one or both shoulders or arms.    Lightheadedness or sudden weakness.    A fast or irregular heartbeat.   After you call 911, the operator may tell you to chew 1 adult-strength or 2 to 4 low-dose aspirin. Wait for an ambulance. Do not try to drive yourself.  Watch closely for changes in your health, and  be sure to contact your doctor if you have any problems.  Where can you learn more?  Go to https://www.bennett.info/ and enter F075 to learn more about "A Healthy Heart: Care Instructions."  Current as of: September 7, 2022Content Version: 13.6   2006-2023 Healthwise, Incorporated.   Care instructions adapted under license by [facility]. If you have questions about a medical condition or this instruction, always ask your healthcare professional. [place] any warranty or liability for your use of this information.  Personalized Preventive Plan for Kyle Williams - 04/18/2022  Medicare offers a range of preventive health benefits. Some of the tests and screenings are paid in full while other may be subject to a deductible, co-insurance, and/or copay.    Some of these benefits include a comprehensive review of your medical history including lifestyle, illnesses that may run in your family, and various assessments and screenings as appropriate.    After reviewing your medical record and screening and assessments performed today your provider may have ordered immunizations, labs, imaging, and/or referrals for you.  A list of these orders (if applicable) as well as your Preventive Care list are included within your After Visit Summary for your review.    Other Preventive Recommendations:    A preventive eye exam performed by an eye specialist is recommended every 1-2 years to screen for glaucoma; cataracts, macular degeneration, and other eye disorders.  A preventive dental visit is recommended every 6 months.  Try to get at least 150 minutes of exercise per week or 10,000 steps per day on a pedometer .  Order or download the FREE "Exercise & Physical Activity: Your Everyday Guide" from [employer]. Call 772 058 8688 or search [employer] online.  You need 1200-1500 mg of calcium and 1000-2000 IU of vitamin D per day.  It is possible to meet your calcium requirement with diet alone, but a vitamin D supplement is usually necessary to meet this goal.  When exposed to the sun, use a sunscreen that protects against both UVA and UVB radiation with an SPF of 30 or greater. Reapply every 2 to 3 hours or after sweating, drying off with a towel, or swimming.  Always wear a seat belt when traveling in a car. Always wear a helmet when riding a bicycle or motorcycle.

## 2022-04-18 NOTE — Progress Notes (Signed)
Kyle Williams presents today for   Chief Complaint   Patient presents with    Medicare AWV     Depression Screening:  PHQ-9 Questionaire 04/17/2022   Little interest or pleasure in doing things 1   Feeling down, depressed, or hopeless 1   Trouble falling or staying asleep, or sleeping too much -   Feeling tired or having little energy -   Poor appetite or overeating -   Feeling bad about yourself - or that you are a failure or have let yourself or your family down -   Trouble concentrating on things, such as reading the newspaper or watching television -   Moving or speaking so slowly that other people could have noticed. Or the opposite - being so fidgety or restless that you have been moving around a lot more than usual -   PHQ-9 Total Score 2       Abuse Screening:  AMB Abuse Screening 04/18/2022   Do you ever feel afraid of your partner? N   Are you in a relationship with someone who physically or mentally threatens you? N   Is it safe for you to go home? Y       Fall Screening  Fall Risk 04/17/2022   2 or more falls in past year? yes   Fall with injury in past year? no       Generalized Anxiety  GAD-7 SCREENING 04/18/2022   Feeling nervous, anxious, or on edge Not at all   Not being able to stop or control worrying Not at all   Worrying too much about different things Not at all   Trouble relaxing Not at all   Being so restless that it is hard to sit still Not at all   Becoming easily annoyed or irritable Not at all   Feeling afraid as if something awful might happen Not at all   GAD-7 Total Score 0        Health Maintenance Due   Topic Date Due    Shingles vaccine (2 of 3) 11/19/2012    Prostate Specific Antigen (PSA) Screening or Monitoring  11/29/2020    COVID-19 Vaccine (3 - Booster for [employer] series) 01/09/2021    Annual Wellness Visit (AWV)  01/27/2022   .      Health Maintenance reviewed and discussed and ordered per Provider.  Vaccines Due   Screenings Due       Kyle Williams is updated on all  HM    1. "Have you been to the ER, urgent care clinic since your last visit?  Hospitalized since your last visit?" Yes in chart    2. "Have you seen or consulted any other health care providers outside of the [facility] since your last visit?" No     3. For patients aged 79-75: Has the patient had a colonoscopy / FIT/ Cologuard? N/A     If the patient is male:    4. For patients aged 37-74: Has the patient had a mammogram within the past 2 years? N/A    5. For patients aged 21-65: Has the patient had a pap smear? N/A

## 2022-04-19 NOTE — Progress Notes (Signed)
Advance Care Planning   Ambulatory ACP Specialist Patient Outreach    Date:  04/19/2022    ACP Specialist:  Elio Forget    Outreach call to patient in follow-up to ACP Specialist referral from:Mariam Jacqualine Mau, MD    [x]  PCP  []  Provider   []  Ambulatory Care Management []  Other     For:                  [x]  Advance Directive Assistance              []  Complete Portable DNR order              []  Complete POST/POLST/MOST              []  Code Status Discussion             []  Discuss Goals of Care             []  Early ACP Decision-Making              []  Other (Specify)    Date Referral Received: 04/18/22    Next Step:   [x]  ACP scheduled conversation  []  Outreach again in one week               [x]  Email / Mail ACP Info Sheets  [x]  Email / Mail Advance Directive   []  Closing referral.  Routing closure to referring provider/staff and to .    []  Closure letter mailed to patient with invitation to contact ACP Specialist if / when ready.   []  Other (Specify here):      Outreaches:         [x]  1st -  Date:  04/19/22               Intervention:  [x]  Spoke with Patient   []  Left Voice mail []  Email / Mail    []  MyChart  []  Other (Specify) :     Outcomes:  Spoke with patient scheduling a Virtual conversation with ACP Specialist on Wednesday, 05/16/22 at 12:30 p.m.              []  2nd -  Date:                 Intervention:  []  Spoke with Patient  []  Left Voice mail []  Email / Mail    []  MyChart  []  Other (Specify) :              Outcomes:                []  3rd -  Date:                Intervention:  []  Spoke with Patient   []  Left Voice mail []  Email / Mail    []  MyChart  []  Other (Specify) :       Outcomes:           []   Additional Outreach -  Date:     (Specify Dates & special circumstances):    Outcomes:         Thank you for this referral.

## 2022-05-16 NOTE — ACP (Advance Care Planning) (Signed)
Advance Care Planning     Advance Care Planning Clinical Specialist  Conversation Note      Date of ACP Conversation: 05/16/2022    Conversation Conducted with:  Environmental health practitioner: Next of Kin by law (only applies in absence of above) (name) Daughter, Kyle Williams    ACP Clinical Specialist: Guadlupe Spanish, LCSW    Healthcare Decision Maker:     Current Designated Healthcare Decision Maker:     Primary Decision Maker: Kyle Williams,Kyle Williams - Child [phone]    Primary Decision Maker: Kyle Williams - Child [phone]    Care Preferences    Hospitalization:  "If your health worsens and it becomes clear that your chance of recovery is unlikely, what would your preference be regarding hospitalization?"    Choice:  []  The patient wants hospitalization.  []  The patient prefers comfort-focused treatment without hospitalization.  Not addressed    Ventilation:  "If you were in your present state of health and suddenly became very ill and were unable to breathe on your own, what would your preference be about the use of a ventilator (breathing machine) if it were available to you?"      If the patient would desire the use of ventilator (breathing machine), answer "yes".  If not, "no": Not addressed    "If your health worsens and it becomes clear that your chance of recovery is unlikely, what would your preference be about the use of a ventilator (breathing machine) if it were available to you?"     Would the patient desire the use of ventilator (breathing machine)?: Not addressed      Resuscitation  "CPR works best to restart the heart when there is a sudden event, like a heart attack, in someone who is otherwise healthy. Unfortunately, CPR does not typically restart the heart for people who have serious health conditions or who are very sick."    "In the event your heart stopped as a result of an underlying serious health condition, would you want attempts to be made to restart your heart (answer "yes" for  attempt to resuscitate) or would you prefer a natural death (answer "no" for do not attempt to resuscitate)?" Not addressed       [x]  Yes   []  No   Educated Patient / Decision Maker regarding differences between Advance Directives and portable DNR orders.    Length of ACP Conversation in minutes:  15    Conversation Outcomes:  ACP discussion completed    Follow-up plan:    []  Schedule follow-up conversation to continue planning  []  Referred individual to Provider for additional questions/concerns   []  Advised patient/agent/surrogate to review completed ACP document and update if needed with changes in condition, patient preferences or care setting    [x]  This note routed to one or more involved healthcare providers    ACP Specialist called patient's daughter, , to inquire about patient's current capacity, as information obtained from chart review show that patient has dementia and unable to make his own medical decisions.  Daughter confirmed that patient did not have capacity to make medical decisions and ACP Specialist explained that patient would not be able to complete a for Health Care document if he did not have decision making capacity.  Daughter expressed understanding.    ACP Specialist spoke further with daughter to inquire about patient's legal next of kin.  Daughter reports that patient is legally married, however, patient's spouse also has dementia and not  able to make medical decisions.  Daughter reports that next in line of the [place] Hierarchy of Decision Making would be her and her brother, Kyle Williams.  It was explained that both siblings would be asked to come together to provide decision making for patient regarding his medical needs.  Daughter also expressed understanding regarding this and states that she and her brother have a good working relationship and there would not be any conflict regarding them making decisions together for patient.      ACP  Specialist spoke with daughter about the process of guardianship and her and her brother's ability to sign a DNR or POLST for patient.  Daughter reports that she and her brother will likely continue to work together to provide the best medical decision making for patient.    Due to patient's inability to complete a [place] for Health Care document because of limited decision making capacity, this referral will be closed.    Prescott Parma. Bettey Costa, LISW-CP  Advance Care Planning Specialist  [facility] [employer]  [phone]

## 2022-06-25 ENCOUNTER — Ambulatory Visit: Admit: 2022-06-25 | Discharge: 2022-06-25 | Payer: MEDICARE | Attending: Acute Care | Primary: Internal Medicine

## 2022-06-25 DIAGNOSIS — F03C18 Unspecified dementia, severe, with other behavioral disturbance: Secondary | ICD-10-CM

## 2022-06-25 MED ORDER — DONEPEZIL HCL 5 MG PO TABS
5 MG | ORAL_TABLET | Freq: Every evening | ORAL | 6 refills | Status: AC
Start: 2022-06-25 — End: ?

## 2022-06-25 MED ORDER — SERTRALINE HCL 25 MG PO TABS
25 MG | ORAL_TABLET | ORAL | 6 refills | Status: DC
Start: 2022-06-25 — End: 2022-08-02

## 2022-06-25 MED ORDER — LEVETIRACETAM 750 MG PO TABS
750 MG | ORAL_TABLET | Freq: Two times a day (BID) | ORAL | 5 refills | Status: AC
Start: 2022-06-25 — End: ?

## 2022-06-25 MED ORDER — MEMANTINE HCL 5 MG PO TABS
5 MG | ORAL_TABLET | ORAL | 6 refills | Status: AC
Start: 2022-06-25 — End: 2022-08-08

## 2022-06-25 NOTE — Progress Notes (Signed)
[facility]  [address] [place]. [address], [place], [place] [address]  Office:  [phone]  Fax: [phone]  Chief Complaint   Patient presents with    Dementia    Follow-up       HPI: Kyle Williams presents in follow-up for Alzheimer's dementia.  He was last seen here on 03/28/2022.  He has had progressive worsening.  He is on donepezil 5 mg daily and Namenda 5 mg twice daily.  He requires help for all ADLs and IADLs.  Behavior has been difficult.  He gets agitated and uncooperative.  He has hallucinations and throws stuff.  He was started on sertraline for behavior.  Recommended Alz.org for resources.  Try to reduce the Claritin and ZzzQuil because of cognitive issues.  Denied recent seizures.  Continued Keppra 750 mg BID.  Home health PT, social worker, home aide.      He presents today in follow-up.  He is with his daughter who provides history.  He had an ER visit in the interim in May- she reports he had a seizure, vomited, aspirated, she reports it was dehydration.  Problem swallowing liquids.  Giving watermelon because he likes that.  Urine is pretty clear.  Has been taking the Keppra regularly.  Not swallowing pills now, hides them, they find it on the floor.  Crushes Keppra, places it in applesauce.  The Zoloft has been very helpful.  Sleeps the whole night now.  Started half a tab, then whole, sometimes 1.5.  Wakes more calm, eats, watches tv.  Has to be with wife now, does not like to be by himself.  Agitated at night.  Mean.  Hits/ swats.  They drive him by to see great grandkids.  He can be inappropriate when out.  Son was full time caregiver but had stroke.  Daughter is here from [place].  He denies pain or particular complaint.  +constipation.  In wheelchair.  Walks.  No wandering- changed the locks and has alarm for at night.  Can roam during the day.  Almost fell outside.  No recent illness at the time of the seizure.  Sometimes coughing with eating.  Eats regular  consistency foods, they cut it up.  Chokes sometimes.      Past Medical History:   Diagnosis Date    Constipation     H/O seasonal allergies     History of BPH     Hypercholesterolemia     Hypertension     Memory loss        Past Surgical History:   Procedure Laterality Date    HERNIA REPAIR      ORTHOPEDIC SURGERY      bilateral shoulder replacement    TOTAL KNEE ARTHROPLASTY Left     TUMOR REMOVAL Right     shoulder        Current Outpatient Medications   Medication Sig Dispense Refill    memantine (NAMENDA) 5 MG tablet Take 1 tab by mouth twice daily. 60 tablet 6    sertraline (ZOLOFT) 25 MG tablet Take 1 tab by mouth daily. In 1 week can increase to 2 tabs daily. 60 tablet 6    donepezil (ARICEPT) 5 MG tablet Take 1 tablet by mouth nightly 30 tablet 6    levETIRAcetam (KEPPRA) 750 MG tablet Take 1 tablet by mouth 2 times daily 60 tablet 5    finasteride (PROSCAR) 5 MG tablet Take 1 tablet by mouth daily 90 tablet 0  amLODIPine-benazepril (LOTREL) 10-40 MG per capsule Take 1 capsule by mouth daily 90 capsule 0    Misc Natural Products (PUMPKIN SEED OIL) CAPS Take 1,000 mg by mouth daily.      Zn-Pyg Afri-Nettle-Saw Palmet (SAW PALMETTO COMPLEX PO) Take 2 tablets by mouth daily.      Sennosides (SENNA) 8.8 MG/5ML LIQD Take  by mouth daily. 1 teaspoon daily      Loratadine (CLARITIN PO) Take by mouth      diphenhydrAMINE HCl, Sleep, (ZZZQUIL PO) Take 15 mLs by mouth nightly as needed (sleep) Sleep      solifenacin (VESICARE) 5 MG tablet Take 1 tablet by mouth daily      atorvastatin (LIPITOR) 40 MG tablet Take 1 tablet by mouth daily 90 tablet 1    MELATONIN PO Take 6 mg by mouth daily      polyethylene glycol (GLYCOLAX) 17 GM/SCOOP powder Take 17 g by mouth daily Mix with 8 oz liquid of choice       No current facility-administered medications for this visit.        No Known Allergies    Social History     Tobacco Use    Smoking status: Former    Smokeless tobacco: Former   Substance Use Topics    Alcohol use:  No    Drug use: No       Family History   Problem Relation Age of Onset    Cancer Mother         ovarian    Cancer Father         stomach    Heart Disease Maternal Grandmother        Review of Systems:  Cannot obtain ROS due to medical condition but he denies any specific complaint.    Physical Examination:  BP 124/82   Pulse 68   Resp 18   Ht 6\' 2"  (1.88 m)   Wt 160 lb (72.6 kg)   SpO2 100%   BMI 20.54 kg/m     Alert, in NAD. Heart is regular.  He is calm.  He smiles.  His daughter provides history.  He does not answer orientation questions.  When asked, he says no.  Endorses he is Mr. .  He does not speak much.  Speech is clear. EOMs are full, no nystagmus. No facial asymmetry.  He does not participate with full exam.  He moves all extremities.  He is in a wheelchair.  He has issues with command following.  He said bye-bye at the end of the visit when daughter asked him to say bye.      Impression/Plan:  This is an 88 year old right-handed male who presents in follow-up for Alzheimer's dementia.  He has had dementia progressively worsening.  He is on donepezil 5 mg daily and Namenda 5 mg twice daily.  He requires help for all ADLs and IADLs.  Behavior has been difficult, gets agitated, uncooperative.  He is doing better on Zoloft 25 mg tablets.  She gives 1 to 1-1/2 tabs per night.  His daughter helps with ADLs and IADLs.  He is not left alone.  He is on Keppra 750 mg twice daily.  She reports 1 recent seizure when he came to the hospital for abdominal pain and aspiration.  Based on renal function we will keep this the same for now.  She is wondering about a thickener for oral liquids to get him to drink more.  He has dysphagia and some choking  when eating.  Will refer to outpatient speech therapy for swallowing evaluation.  She also would like to consider cards so he can point to his needs.  Follow up in 3 to 4 months.    Misha was seen today for dementia and follow-up.    Diagnoses and all orders  for this visit:    Severe dementia with other behavioral disturbance, unspecified dementia type (HCC)  -     [facility]	[place]. [place]  -     sertraline (ZOLOFT) 25 MG tablet; Take 1 tab by mouth daily. In 1 week can increase to 2 tabs daily.    Dysphagia, unspecified type  -     [facility]	[place]. [place]    Alzheimer's disease, unspecified (CODE) (HCC)  -     [facility]	[place]. [place]  -     donepezil (ARICEPT) 5 MG tablet; Take 1 tablet by mouth nightly    Seizure (HCC)  -     [facility]	[place]. [place]  -     levETIRAcetam (KEPPRA) 750 MG tablet; Take 1 tablet by mouth 2 times daily    Other orders  -     memantine (NAMENDA) 5 MG tablet; Take 1 tab by mouth twice daily.      Signed By: Maurine Simmering, APRN - NP        PLEASE NOTE:   Portions of this document may have been produced using voice recognition software. Unrecognized errors in transcription may be present.

## 2022-06-25 NOTE — Patient Instructions (Signed)
[facility]  [address]  [place], [place] [address]  [phone]

## 2022-07-17 ENCOUNTER — Encounter

## 2022-07-17 NOTE — Telephone Encounter (Signed)
Requested Prescriptions     Pending Prescriptions Disp Refills    amLODIPine-benazepril (LOTREL) 10-40 MG per capsule 90 capsule 0     Sig: Take 1 capsule by mouth daily    finasteride (PROSCAR) 5 MG tablet 90 tablet 0     Sig: Take 1 tablet by mouth daily

## 2022-07-19 MED ORDER — AMLODIPINE BESY-BENAZEPRIL HCL 10-40 MG PO CAPS
10-40 MG | ORAL_CAPSULE | Freq: Every day | ORAL | 0 refills | Status: AC
Start: 2022-07-19 — End: ?

## 2022-07-19 MED ORDER — FINASTERIDE 5 MG PO TABS
5 MG | ORAL_TABLET | Freq: Every day | ORAL | 0 refills | Status: AC
Start: 2022-07-19 — End: ?

## 2022-08-02 ENCOUNTER — Telehealth: Admit: 2022-08-02 | Discharge: 2022-08-02 | Payer: MEDICARE | Attending: Internal Medicine | Primary: Internal Medicine

## 2022-08-02 DIAGNOSIS — F03C18 Unspecified dementia, severe, with other behavioral disturbance: Secondary | ICD-10-CM

## 2022-08-02 MED ORDER — SERTRALINE HCL 50 MG PO TABS
50 MG | ORAL_TABLET | ORAL | 2 refills | Status: AC
Start: 2022-08-02 — End: ?

## 2022-08-02 NOTE — Progress Notes (Signed)
Kyle Williams, was evaluated through a synchronous (real-time) audio-video encounter. The patient (or guardian if applicable) is aware that this is a billable service, which includes applicable co-pays. This Virtual Visit was conducted with patient's (and/or legal guardian's) consent. Patient identification was verified, and a caregiver was present when appropriate.   The patient was located at Home: [address] [place] [address]  Provider was located at [employer] (Appt Dept): [address]  [address]  [place],  [place] [address]      Kyle Williams (DOB:  [birth year]) is a Established patient, presenting virtually for evaluation of the following:    Assessment & Plan   Below is the assessment and plan developed based on review of pertinent history, physical exam, labs, studies, and medications.  1. Severe dementia with other behavioral disturbance, unspecified dementia type (HCC)  -     sertraline (ZOLOFT) 50 MG tablet; Take 2 tabs daily, Disp-60 tablet, R-2Normal  -     [facility]  2. Protein-calorie malnutrition, unspecified severity (HCC)  -     [facility]  3. Recurrent major depressive disorder, remission status unspecified (HCC)  -     [facility]  4. Failure to thrive in adult  -     [facility]    Patient with worsening dementia, malnutrition, failure to thrive.  Patient has been progressively declining.    Will refer to hospice care.      No follow-ups on file.       Subjective      Patient has a PMHx of dementia, seizures, HLD, HTN, CKD3.     Seizures: Patient is on Keppra.  No recent seizures.    Dementia: He is on Aricept.  He was also started on Namenda but is currently not on it-family felt it did no benefit.  Following with neurology. Patient has limited communication and is unable to recognize his family.  Patient has been more agitated-she was started on Seroquel.  He requires help with all of his ADLs.  He is not oriented to  person, place or time.  His son and daughter have been taking care of him at home.    Spoke to patient's daughter over the phone.  She reports that patient's dementia has been progressively worsening.  He is sleeping most of the day and sometimes get up at night for a few hours when he is extremely agitated. Zoloft has been helping. Patient has hardly been eating and has been losing more weight.  He was previously eating 1 meal a day but is now not having that completely either.    I had previously discussed with the family regarding options of nursing home/hospice.  Given patient's progressive decline, the family had a conversation and are now agreeable to hospice care.  It is difficult for them to manage at home.    HTN: Prescribed amlodipine and benazepril.    HLD: Prescribed Lipitor 40 mg daily.  Lipid panel in 1/23 showed LDL 72, HDL 75, triglyceride 90.         Review of Systems   Unable to perform ROS: Dementia        Objective   Patient-Reported Vitals  No data recorded     Physical Exam  [INSTRUCTIONS:  "[x] " Indicates a positive item  "[] " Indicates a negative item  -- DELETE ALL ITEMS NOT EXAMINED]    Constitutional: []  Appears well-developed and well-nourished []   No apparent distress      [x]  Abnormal -     Mental status: []  Alert and awake  []  Oriented to person/place/time []  Able to follow commands    [x]  Abnormal -     Eyes:   EOM    []   Normal    []  Abnormal -   Sclera  []   Normal    []  Abnormal -          Discharge []   None visible   []  Abnormal -     HENT: []  Normocephalic, atraumatic  []  Abnormal -   []  Mouth/Throat: Mucous membranes are moist    External Ears []  Normal  []  Abnormal -    Neck: []  No visualized mass []  Abnormal -     Pulmonary/Chest: [x]  Respiratory effort normal   [x]  No visualized signs of difficulty breathing or respiratory distress        []  Abnormal -      Musculoskeletal:   []  Normal gait with no signs of ataxia         []  Normal range of motion of neck        [x]  Abnormal -      Neurological:        []  No Facial Asymmetry (Cranial nerve 7 motor function) (limited exam due to video visit)          []  No gaze palsy        []  Abnormal -  unable to assess        Skin:        [x]  No significant exanthematous lesions or discoloration noted on facial skin         []  Abnormal -            Psychiatric:       []  Normal Affect []  Abnormal -        []  No Hallucinations    Other pertinent observable physical exam findings:-  Patient was sleeping comfortably in bed. Appears cachetic/malnourished           -- , [place]

## 2022-08-03 ENCOUNTER — Encounter: Payer: TRICARE (CHAMPUS) | Primary: Internal Medicine

## 2022-08-03 NOTE — Telephone Encounter (Signed)
Spoke with daughter, Oretha Milch.  Discussed [facility] philosophy, services, criteria, and IDT.   Discussed caregiver need for round the clock care.    Primary caregiver identified as Oretha Milch.  There are no caregiver concerns identified at this time.  Questions answered. Hospice Evaluation to take place in the home on Sunday 08/05/22.    Provided with 24/7 contact information.    Thank you for the referral to [facility]. If we can be of further assistance please contact [phone].

## 2022-08-05 ENCOUNTER — Encounter: Admit: 2022-08-05 | Discharge: 2022-08-05 | Payer: MEDICARE | Primary: Internal Medicine

## 2022-08-05 NOTE — Hospice (Signed)
This Probation officer met with pts son and daughter to discuss hospice. Pt appears to be frail and failing in his health, appropriate for hospice. Son and daughter would like to discuss hospice admission with their mother before making a decision. Hospice handbook and agency phone number left with family.

## 2022-08-06 ENCOUNTER — Encounter: Payer: MEDICARE | Primary: Internal Medicine

## 2022-08-07 ENCOUNTER — Encounter: Admit: 2022-08-07 | Discharge: 2022-08-07 | Payer: MEDICARE | Primary: Internal Medicine

## 2022-08-07 ENCOUNTER — Encounter: Primary: Internal Medicine

## 2022-08-07 NOTE — Hospice (Signed)
Rienforced inforation about hospice criteria and services. Family wishes to elect hospic service, effect toay. Pt appropriate forhospiceservices and will b amited today,

## 2022-08-07 NOTE — Hospice Non-Covered (Unsigned)
Primary Diagnosis  Senile degeneration of brain (HCC)  Related Diagnoses  Appetite loss  Malnutrition, unspecified type (HCC)  Debility  Depression, unspecified depression type  Seizures (HCC)  Chronic kidney disease, unspecified CKD stage  Encounter for hospice care        No non-covered drugs.        No active items.        Not Covered Services   {[facility] NON-COVERED SERVICES:45233}

## 2022-08-07 NOTE — Hospice (Signed)
Hospice Admission Summary  [name], a 88 y.o male, admitted to hospice with a dx of senile degeneration of the brain. Secondary hospice dx.: Appetite loss, malnutrition, debility, depression, and behavioral issues..  Patient has elected Hospice services and is no longer seeking aggressive treatment. Co-morbidities related to the terminal dx. are depression, seizures, and CKD  [name]'s status 6 months - 1 year prior to hospice admission: Pt. was ambulatory an eating an walking, but has slowly declined to his current status of barely eating an spending most of hist time in bed sleeping (20 hrs./ay).  The patient/family/caregiver(Kyle Williams) is  and willing and safely able to provide care and administer medications, their availability is daily 24/7 daily.  The patient/family participated in goal setting, care planning, and are agreeable to the care plan.  Admission booklet reviewed with patient/family/caregiver services provided under hospice benefit, review of rights and responsibilities, disposal of medications, contact information for administrator, Joint Commission, Medicare, QIO, and outside resources for independence.  The patient/family educated on IDT and their right to attend meetings.  Education provided regarding 24-hour availability of hospice services and on-call number provided.    Attending physician:  Dr. Arvil Persons  Medical Director:  Dr Arvil Persons  Level of Care:  Routine  Advance Directives:  DNR signed during admission.  Military:  Served in [employer].  Allergies: NKDA  PPS:  20%  FAST:  7C  NYHA: N/A  EF%:  N/A   Tobacco usage:  N/A,   Functional status:  Bedbound. Needs assistance for all ADLs  Infection:  None  Bowel Regimen:  Yes. Bisacodyl  Lines, Drains, or Airways present:  N/A  Wounds present: None  Symptoms to monitor to maintain comfort:  Pain, respiratory distress.  Hospice Aide Services Requested:  Yes  MSW Requested: Yes  Chaplain Requested: Yes  Volunteer Services  Requested:  Yes  Bereavement risk/contact:  Kyle Williams (daughter)-Risk low  Patient/family/caregiver specific end of life goal:  Solicitor and education provided this visit:  Mediations, hospice orientation  Plan for next visit: Obtain MAC, hospice orientation  Care coordination with Medical Director, IDG, and Kyle Williams, regarding admission to hospice and all are in agreement with plan of care.  Focus for the next two weeks willbe to continue hospiceorientation, educate cargiver on medications,monitor and manage sx.

## 2022-08-07 NOTE — Hospice (Deleted)
Pt lyin on left side in bed with eyes closed. Responsssive to voice, but did not actively participate in amission assessment. Daughter (caregiver and POA) reports pt has decined rapidly in past few months, and hospice care sought to assist family to provide comfort for patient for his remaining days. Daughter and son care for pt. in his home. Pt,.has spouse that is totally blind and also lives in the home. Kyle Williams and daughter rotate caring for patient and returning to the daughter's home in [place].    Hospice Admission Summary  [name], a 88 y.o male, admitted to hospice with a dx of senile degeneration of the brain. Secondary hospice dx.: Appetite loss, malnutrition, debility, depression, and behavioral issues..  Patient has elected Hospice Servvices and is no longer seeking aggressive treatment. Co-morbiities related to the terminal dx. are depression, seizures, and CK    Mr.. Kyle Williams's status 6 months - 1 year prior to hospice admission: Pt. was ambulatory an eating an walking, but has slowly declined to his current status of barely eating an spending most of hist time in bed sleeping (20 hrs./ay).  The patient/family/caregiver/ Kyle Williams is not and willing and safely able to provide care and administer medications, their availability is daily 24/7 daily.  The patient/family participated in goal setting, care planning, and are agreeable to the care plan.  Admission booklet reviewed with patient/family/caregiver/facility; services provided under hospice benefit, review of rights and responsibilities, disposal of medications, contact information for administrator, Joint Commission, Medicare, QIO, and outside resources for independence.  The patient/family educated on IDT and their right to attend meetings.  Education provided regarding 24-hour availability of hospice services and on-call number provided.    Attending physician:  Dr. Arvil Persons  Medical Director:  Dr Arvil Persons  Level of Care:  Routine  Advance  Directives:  DNR signed during admission.  Military:  Served in [employer].  Allergies: NKDA  PPS:  20%  FAST:  7C  NYHA: N/A  EF%:  N/A   Tobacco usage:  N/A,   Functional status:  Bedbound. Needs assistance for all ADLs  Infection:  None  Bowel Regimen:  Yes. Biasacodyl  Lines, Drains, or Airways present:  N/A  Wounds present: None  Symptoms to monitor to maintain comfort:  Pain, respiratory distress  Hospice Aide Services Requested:  Yes  MSW Requested: Yes  Chaplain Requested: Yes  Volunteer Services Requested:  Yes  Bereavement risk/contact:  Kyle Williams (daughter)-Risk low  Patient/family/caregiver specific end of life goal:  Solicitor and education provided this visit:  Mediations, hospice orientation  Plan for next visit: Obtain MAC, hospice orientation  Care coordination with Medical Director, IDG, and Kyle Williams, regarding admission to hospice and all are in agreement with plan of care.

## 2022-08-08 ENCOUNTER — Encounter: Admit: 2022-08-08 | Discharge: 2022-08-08 | Payer: MEDICARE | Primary: Internal Medicine

## 2022-08-08 NOTE — Hospice (Signed)
Pt. sleping in bed. opened eys briefly, Declined VS.Daughter report he sleeps most of the time. Take in only a container of apple auce or pudding with meds. Fluid intake s 1-1.5 c water daily. No evidence of pain or aniety. Skin remains warm, dry and intacr. Recociled meds with daughter. Stopped lptor, sleep meds, claaiin, namenda, glycoax, and saw palmett. Family states they will pay for pumpkin seed oil and senna.  No new mdiations odered. Med teaching don re: Comfort meds. Discussed dying process with daughter who verbalizes good understnding. Pan for next visit: Continue hospice orientation, educaete on use of oxygen concentrator. Monitor and manage sx.

## 2022-08-09 ENCOUNTER — Encounter: Admit: 2022-08-09 | Discharge: 2022-08-09 | Payer: MEDICARE | Primary: Internal Medicine

## 2022-08-09 ENCOUNTER — Encounter: Payer: MEDICARE | Primary: Internal Medicine

## 2022-08-09 NOTE — Hospice (Deleted)
Hospice Admission Summary  Mr. Georgian Co, a 88 y.o male, admitted to hospice with a dx of senile degeneration of the brain. Secondary hospice dx.: Appetite loss, malnutrition, debility, depression, and behavioral issues..  Patient has elected Hospice services and is no longer seeking aggressive treatment. Co-morbidities related to the terminal dx. are depression, seizures, and CKD  Mr. [name]'s status 6 months - 1 year prior to hospice admission: Pt. was ambulatory an eating an walking, but has slowly declined to his current status of barely eating an spending most of hist time in bed sleeping (20 hrs./ay).  The patient/family/caregiver(Michelle) is  and willing and safely able to provide care and administer medications, their availability is daily 24/7 daily.  The patient/family participated in goal setting, care planning, and are agreeable to the care plan.  Admission booklet reviewed with patient/family/caregiver services provided under hospice benefit, review of rights and responsibilities, disposal of medications, contact information for administrator, Joint Commission, Medicare, QIO, and outside resources for independence.  The patient/family educated on IDT and their right to attend meetings.  Education provided regarding 24-hour availability of hospice services and on-call number provided.    Attending physician:  Dr. Evalee Jefferson  Medical Director:  Dr Evalee Jefferson  Level of Care:  Routine  Advance Directives:  DNR signed during admission.  Military:  Served in [employer].  Allergies: NKDA  PPS:  20%  FAST:  7C  NYHA: N/A  EF%:  N/A   Tobacco usage:  N/A,   Functional status:  Bedbound. Needs assistance for all ADLs  Infection:  None  Bowel Regimen:  Yes. Bisacodyl  Lines, Drains, or Airways present:  N/A  Wounds present: None  Symptoms to monitor to maintain comfort:  Pain, respiratory distress.    Focus for the next two weeks will be to orient to hospice services, monitor and manage sx., and  establish aide services to assist with ADLs.

## 2022-08-09 NOTE — Hospice (Signed)
The purpose of today's visit was to complete an initial SW assessment for Kyle Williams, [name].  [name] is a 88 year old male, admitted to hospice on 08/07/22 with a terminal diagnosis of Senile degeneration of the brain.  SW arrived at the single family residence home.  There are four steps to gain entry to the front door of the home.  SW was welcomed in by pt's daughter, [name].  Son, [name] was also present for the assessment.  The home is well kept and there are no safety concerns noted by SW at time of assessment.  CG's have been caring for both parents for about 7 years.  Son [name] has been primary CG, living in the home with both parents.  Prior to admission to hospice, pt was able to walk, eating well, and mostly continent of bowel and bladder.  CG's note a steep decline and pt is now bedbound, eating less and sleeping 20+ hours a day.  [name] will be moving out of state at the end of the month and paid caregiver's will be sought for additional support.  Pt is a veteran and Aids and Attendance benefit was discussed.  CG is having issues with applying online due to pt's expired D.L. SW will work with [place] and family to get Aid and Attendance application completed.  CG is also considering placement into a SNF as caring for both parents is becoming increasingly difficult.  SW offered resources for SNF in the area and provided CMS website information and education about SNF ratings.  Alzheimer's education also provided to understand disease process and pt's current condition and decline. Pt has not recognized children for several years.  Pt was received in his bedroom, in his personal bed.  Pt is alert to self only.  He is dressed appropriately and there are no visible signs of abuse/neglect.  Pt appears well cared for.  SW discusses need for hospital bed when HA services begin. At this time, CG's are getting pt up and into the shower, but it has become increasingly difficult.  SW educates on fall prevention,  fall notification to hospice, and encourages HA services.  CG have placed a motion alarm on the pt's bed due to pt still trying to get up without assistance. CG's accept HA services and CM is notified during visit of requests.  Volunteer services discussed and CG's would like a call from [facility] to discuss volunteers.  Education provided on respite and discussion on hospice philosophy.  CG's also given area resources for cremation services and are encouraged to decided on provider by next SW visit.  Both [name] and [name] are low risk bereavement. SW to follow up with [facility] about documentation needed for Aid and Attendance application.  No other needs at this time.

## 2022-08-10 ENCOUNTER — Encounter: Admit: 2022-08-10 | Discharge: 2022-08-10 | Payer: MEDICARE | Primary: Internal Medicine

## 2022-08-10 NOTE — Hospice (Signed)
Patient was in bed resting. Patients daughter, son and wife are present. Daaughter and son are main caregivers. His wife is blind and he was her caregiver. Chaplain will continue to follow.

## 2022-08-13 ENCOUNTER — Encounter: Payer: MEDICARE | Primary: Internal Medicine

## 2022-08-13 NOTE — Hospice (Signed)
Called Caregiver. Caregiver stated that the patient was really agitated, because his brother showed up, to talk to him. Per Caregiver report, Patient was medicated, and just went to sleep. Caregiver did not want patient Seen today. Caregiver said we could visit Tomorrow.

## 2022-08-14 ENCOUNTER — Encounter: Admit: 2022-08-14 | Discharge: 2022-08-14 | Payer: MEDICARE | Primary: Internal Medicine

## 2022-08-15 NOTE — Hospice (Signed)
Medication refills ordered this visit: N/A    Medications reconciled and all medications are available in the home this visit.  Medications are effective/not effective at this time.      Supplies by type and quantity ordered this visit include: N/A    Consulted medical director/attending physician regarding: N/A    Instructed patient/family/caregiver on 24-hour hospice availability and phone number.    Plan for next visit:  Will Follow up, With CM Next week.  Arrived at Patients residence. Patient was sleeping, not to hard to awaken. Patient seemed a little agitated, But was pleasant. Vitals were taken, All vitals were wnl. Patient had a hard time staying awake. Seemed to be comfortable, had no issues with distress.

## 2022-08-15 NOTE — Hospice (Deleted)
Patient was in bed resting. Patients daughter, son and wife are present. Daaughter and son are main caregivers. His wife is blind and he was her caregiver. Chaplain will continue to follow.

## 2022-08-15 NOTE — Hospice (Deleted)
Plan of care reviewed at [facility] with the treatment team.   Meds and treatment orders reviewed and updated, if needed.   Change in patient status: increasing confusion and declining cognition, bowel/bladder incontinence, increasing weakness, dependent on all ADLs, appetite loss, bedbound status, and increasing sleepiness to 18 hours a day  Focus for the next two weeks: Monitor patient for signs and symptoms of discomfort and management with current medications.

## 2022-08-15 NOTE — Hospice (Deleted)
Plan for next 2 weeks:  Monitor for decline.  Assess use of comfort medications and educate family on when to give.  The patient is at high risk for falls, aspiration and skin integrity issues.  Continue to monitor and prevent.

## 2022-08-15 NOTE — Hospice (Deleted)
Daughter, [name] verbalized understanding of the POC.

## 2022-08-17 ENCOUNTER — Encounter: Admit: 2022-08-17 | Discharge: 2022-08-17 | Payer: MEDICARE | Primary: Internal Medicine

## 2022-08-17 NOTE — Hospice (Signed)
The following standard precautions for infection control were utilized:  GLOVES AND MASK     Care provided per established plan of care: YES    Patient is without complaints during care provided.     Patient requests: N/A    Family/Caregiver requests: N/A    Communicated to Case Manager, Clinical Manager, or Director regarding patient/family/caregiver/aide findings: N/A    Status of patient upon end of hospice aide visit:  IN BED RESTING COMFORTABLY

## 2022-08-20 ENCOUNTER — Encounter: Payer: MEDICARE | Primary: Internal Medicine

## 2022-08-20 NOTE — Hospice (Signed)
Caregiver verbalizes good understanding of care plan.

## 2022-08-20 NOTE — Hospice (Signed)
Patient was in bed resting. Patients daughter, son and wife are present. Daaughter and son are main caregivers. His wife is blind and he was her caregiver. Chaplain will continue to follow.

## 2022-08-20 NOTE — Hospice (Signed)
Reviewed plan of care, medications, and discussed goals during IDG on 08/15/22.

## 2022-08-20 NOTE — Hospice (Signed)
Focus for the next two weeks will be to orient to hospice services, monitor and manage sx., and establish aide services to assist with ADLs.

## 2022-08-20 NOTE — Hospice (Signed)
Transcribed for Kyle Rote, RN    Hospice Admission Summary  [name], a 88 y.o male, admitted to hospice with a dx of senile degeneration of the brain. Secondary hospice dx.: Appetite loss, malnutrition, debility, depression, and behavioral issues..  Patient has elected Hospice services and is no longer seeking aggressive treatment. Co-morbidities related to the terminal dx. are depression, seizures, and CKD  [name]'s status 6 months - 1 year prior to hospice admission: Pt. was ambulatory an eating an walking, but has slowly declined to his current status of barely eating an spending most of hist time in bed sleeping (20 hrs./ay).  The patient/family/caregiver/ Kyle Williams is not and willing and safely able to provide care and administer medications, their availability is daily 24/7 daily.  The patient/family participated in goal setting, care planning, and are agreeable to the care plan.  Admission booklet reviewed with patient/family/caregiver/facility; services provided under hospice benefit, review of rights and responsibilities, disposal of medications, contact information for administrator, Joint Commission, Medicare, QIO, and outside resources for independence.  The patient/family educated on IDT and their right to attend meetings.  Education provided regarding 24-hour availability of hospice services and on-call number provided.    Attending physician:  Dr. Arvil Williams  Medical Director:  Dr Kyle Williams  Level of Care:  Routine  Advance Directives:  DNR signed during admission.  Military:  Served in [employer].  Allergies: NKDA  PPS:  20%  FAST:  7C  NYHA: N/A  EF%:  N/A   Tobacco usage:  N/A,   Functional status:  Bedbound. Needs assistance for all ADLs  Infection:  None  Bowel Regimen:  Yes. Biasacodyl  Lines, Drains, or Airways present:  N/A  Wounds present: None  Symptoms to monitor to maintain comfort:  Pain, respiratory distress  Hospice Aide Services Requested:  Yes  MSW  Requested: Yes  Chaplain Requested: Yes  Volunteer Services Requested:  Yes  Bereavement risk/contact:  Kyle Williams (daughter)-Risk low  Patient/family/caregiver specific end of life goal:  Solicitor and education provided this visit:  Mediations, hospice orientation  Plan for next visit: Obtain MAC, hospice orientation  Care coordination with Medical Director, IDG, and Kyle Williams, regarding admission to hospice and all are in agreement with plan of care

## 2022-08-20 NOTE — Hospice (Signed)
Patient/family is receiving emotional support within 3 visit(s). Scientist, research (physical sciences) on hospice and ancillary services: [employer], [place] [facility] and [employer].

## 2022-08-20 NOTE — Hospice (Signed)
Bereaved assessed as low risk level due to no significant risk factors.    GOALS:  Bereaved will be assessed and supported as needed toward the goals of a healthy grief process, positive coping, and adjustment to role and lifestyle changes.    PLAN OF CARE:  Telephone call by 30 days after patient death by bereavement staff/volunteers to assess needs, provide support and/or referrals as needed, provide information on bereavement services available x 13 months, and update the plan of care.  Ongoing assessment, services, and care plan updates as needed x 13 months.  Quarterly bereavement mailings x 13 months to include: a sympathy card, program information, grief education resources, and an invitation to the service.

## 2022-08-21 ENCOUNTER — Encounter: Payer: MEDICARE | Primary: Internal Medicine

## 2022-08-21 ENCOUNTER — Encounter: Admit: 2022-08-21 | Discharge: 2022-08-21 | Payer: MEDICARE | Primary: Internal Medicine

## 2022-08-23 ENCOUNTER — Encounter: Payer: MEDICARE | Primary: Internal Medicine

## 2022-08-24 ENCOUNTER — Encounter: Payer: MEDICARE | Primary: Internal Medicine

## 2022-08-27 ENCOUNTER — Encounter: Admit: 2022-08-27 | Discharge: 2022-08-27 | Payer: MEDICARE | Primary: Internal Medicine

## 2022-08-27 NOTE — Hospice (Signed)
Patients daughter is looking into placement for her father at the end of the month as her brother will be leaving and she would be the sole caregiver.  Daughter is still deciding if she wants to keep her father at home or if she wants [place] to order her a hospital bed for the home.  She will let us know when she makes a decision.    Medication refills ordered this visit: No refills needed this visit    Medications reconciled and all medications are available in the home this visit.  The following education was provided regarding medications, medication interactions, and look alike medications: Medication side effects, dosages, purposes, frequencies.  Response to teaching: Verbalized understanding. Medications are effective at this time.      Supplies by type and quantity ordered this visit include: gloves large, chux, barrier cream---Order Number : [id number]    Consulted medical director/attending physician regarding: Not needed    Instructed patient/family/caregiver on 24-hour hospice availability and phone number.    Plan for next visit:  Continue end of life education and support caregiver.

## 2022-08-28 ENCOUNTER — Encounter: Payer: MEDICARE | Primary: Internal Medicine

## 2022-08-29 ENCOUNTER — Encounter: Payer: MEDICARE | Primary: Internal Medicine

## 2022-08-30 ENCOUNTER — Encounter: Payer: MEDICARE | Primary: Internal Medicine

## 2022-08-30 NOTE — Hospice (Signed)
Caregiver/Family findings/issues during SN visit:  None at this time    Medication refills ordered this visit:  haldol ordered for delivery by [facility] tomorrow    Medications reconciled and all medications are available in the home this visit.  Education was provided regarding medications, medication interactions, and look alike medications.  Response to teaching.  Daughter and son verbalize understanding.  Medications are effective at this time.      Supplies by type and quantity ordered this visit include:  None needed    Consulted medical director/attending physician regarding: not at [facility] time    Instructed patient/family/caregiver on 24-hour hospice availability and phone number.    Plan for next visit:  Continued EOL education and support

## 2022-08-31 ENCOUNTER — Encounter: Payer: MEDICARE | Primary: Internal Medicine

## 2022-09-03 ENCOUNTER — Encounter: Admit: 2022-09-03 | Discharge: 2022-09-03 | Payer: TRICARE (CHAMPUS) | Primary: Internal Medicine

## 2022-09-03 ENCOUNTER — Encounter: Admit: 2022-09-03 | Discharge: 2022-09-03 | Payer: MEDICARE | Primary: Internal Medicine

## 2022-09-03 NOTE — Hospice (Signed)
Patient is now non-responsive and the family is at the bedside. Wife is having a rough time but does not acknowledge it. Some of the denial is due to alzheimer. Chaplain will continue to follow.

## 2022-09-03 NOTE — Hospice (Signed)
Patient/Caregiver/Family/Facility findings/issues during SN visit:  Patient is unresponsive. Pupils are fixed and lower extremities are mottled.  Visits changed to daily for symptom management and family support.    Medication refills ordered this visit: Not needed    Medications reconciled and all medications are available in the home this visit.  The following education was provided regarding medications, medication interactions, and look alike medications: Medication side effects, dosages, purposes, frequencies.  Response to teaching: Verbalized understanding. Medications are effective at this time.      Supplies by type and quantity ordered this visit include: Not needed    Consulted medical director/attending physician regarding: Not needed    Instructed patient/family/caregiver on 24-hour hospice availability and phone number.    Plan for next visit:  Continue end of life education and support caregiver.

## 2022-09-04 ENCOUNTER — Encounter: Admit: 2022-09-04 | Discharge: 2022-09-04 | Payer: MEDICARE | Primary: Internal Medicine

## 2022-09-04 ENCOUNTER — Encounter: Payer: TRICARE (CHAMPUS) | Primary: Internal Medicine

## 2022-09-04 NOTE — Hospice (Signed)
Plan for next 2 weeks:  Patient has a decreased appetite to now 1 meal a day.  He is sleeping in excess of 18 hours/day.  Patient remains at high risk for aspiration and falls.  Assess use of comfort medications when necessary.

## 2022-09-04 NOTE — Hospice (Signed)
Postmortem care provided to patient.  Family actively and willingly participated in care.  Patient and families postmortem religious and cultural wishes maintained.  Eyes gently closed.  Soft rolled towel placed under patient chin.  HOB raised to 30 degrees.      Encouraged family to spend time with deceased patient.  Provided emotional support.  This clinician did not stay with family until funeral home arrived.      Family is appropriately coping/grieving currently.    [facility] picked up remains.

## 2022-09-04 NOTE — Hospice (Signed)
Plan of care reviewed at [facility] with the treatment team.   Meds and treatment orders reviewed and updated, if needed.   Change in patient status: decreasing appetite, increasing sleepiness and weakness, dependent on ADLs, appetite loss, increased fall and aspiration risk  Focus for the next two weeks: Monitor patient for signs and symptoms of discomfort and management with current medications.

## 2022-09-04 NOTE — Hospice (Signed)
Patient was in bed resting. Patients daughter, son and wife are present. Daaughter and son are main caregivers. His wife is blind and he was her caregiver. Chaplain will continue to follow.

## 2022-09-04 NOTE — Hospice (Signed)
Kyle Williams will be volunteering for family while they downsize their home.

## 2022-09-04 NOTE — Hospice (Signed)
Patient/family is receiving emotional support within 3 visit(s). Patient/family will receive support from community resources within 3 visit(s).

## 2022-09-04 NOTE — Hospice (Signed)
Reviewed plan of care, medications, and discussed goals during IDG on 08/30/22.

## 2022-09-04 NOTE — Hospice (Signed)
Daughter verbalized understanding of the POC

## 2022-09-04 NOTE — Hospice (Signed)
Patient/Caregiver/Family/Facility findings/issues during SN visit:  No issues, family is medicating appropriately and symptoms are managed.    Medication refills ordered this visit: Not needed this visit    Medications reconciled and all medications are available in the home this visit.  The following education was provided regarding medications, medication interactions, and look alike medications: Medication side effects, dosages, purposes, frequencies.  Response to teaching: Verbalized understanding. Medications are effective at this time.      Supplies by type and quantity ordered this visit include: No supplies needed    Consulted medical director/attending physician regarding: Not needed    Instructed patient/family/caregiver on 24-hour hospice availability and phone number.    Plan for next visit:  Continue end of life education and support caregiver.

## 2022-09-05 ENCOUNTER — Encounter: Payer: TRICARE (CHAMPUS) | Primary: Internal Medicine

## 2022-09-06 ENCOUNTER — Encounter: Payer: TRICARE (CHAMPUS) | Primary: Internal Medicine

## 2022-09-07 ENCOUNTER — Encounter: Payer: TRICARE (CHAMPUS) | Primary: Internal Medicine

## 2022-09-10 ENCOUNTER — Encounter: Payer: TRICARE (CHAMPUS) | Primary: Internal Medicine

## 2022-09-13 ENCOUNTER — Encounter: Payer: TRICARE (CHAMPUS) | Primary: Internal Medicine

## 2022-09-17 ENCOUNTER — Encounter: Payer: TRICARE (CHAMPUS) | Primary: Internal Medicine

## 2022-09-25 ENCOUNTER — Ambulatory Visit: Payer: TRICARE (CHAMPUS) | Attending: Acute Care | Primary: Internal Medicine

## 2022-09-26 DEATH — deceased
